# Patient Record
Sex: Female | Born: 1960 | Race: Black or African American | Hispanic: No | Marital: Single | Smoking: Never smoker
Health system: Southern US, Community
[De-identification: ages and names within clinical notes are randomized; demographics above are authoritative.]

## PROBLEM LIST (undated history)

## (undated) DIAGNOSIS — I10 Essential (primary) hypertension: Secondary | ICD-10-CM

## (undated) HISTORY — DX: Essential (primary) hypertension: I10

## (undated) HISTORY — PX: LUMBAR FUSION: SHX111

---

## 2010-07-23 DIAGNOSIS — K219 Gastro-esophageal reflux disease without esophagitis: Secondary | ICD-10-CM | POA: Insufficient documentation

## 2013-04-13 DIAGNOSIS — E11319 Type 2 diabetes mellitus with unspecified diabetic retinopathy without macular edema: Secondary | ICD-10-CM | POA: Insufficient documentation

## 2017-01-26 DIAGNOSIS — I152 Hypertension secondary to endocrine disorders: Secondary | ICD-10-CM | POA: Insufficient documentation

## 2017-01-29 DIAGNOSIS — E1165 Type 2 diabetes mellitus with hyperglycemia: Secondary | ICD-10-CM | POA: Insufficient documentation

## 2017-01-29 DIAGNOSIS — I129 Hypertensive chronic kidney disease with stage 1 through stage 4 chronic kidney disease, or unspecified chronic kidney disease: Secondary | ICD-10-CM | POA: Insufficient documentation

## 2017-01-29 DIAGNOSIS — Z794 Long term (current) use of insulin: Secondary | ICD-10-CM | POA: Insufficient documentation

## 2017-09-01 DIAGNOSIS — Z981 Arthrodesis status: Secondary | ICD-10-CM | POA: Insufficient documentation

## 2018-04-20 LAB — HM MAMMOGRAPHY

## 2018-06-01 DIAGNOSIS — G4700 Insomnia, unspecified: Secondary | ICD-10-CM | POA: Insufficient documentation

## 2019-02-01 LAB — HM COLONOSCOPY

## 2019-06-24 DIAGNOSIS — I340 Nonrheumatic mitral (valve) insufficiency: Secondary | ICD-10-CM | POA: Insufficient documentation

## 2019-07-12 DIAGNOSIS — F329 Major depressive disorder, single episode, unspecified: Secondary | ICD-10-CM | POA: Insufficient documentation

## 2019-07-12 DIAGNOSIS — F3341 Major depressive disorder, recurrent, in partial remission: Secondary | ICD-10-CM | POA: Insufficient documentation

## 2019-07-30 DIAGNOSIS — N1831 Chronic kidney disease, stage 3a: Secondary | ICD-10-CM | POA: Insufficient documentation

## 2019-08-27 DIAGNOSIS — M7062 Trochanteric bursitis, left hip: Secondary | ICD-10-CM | POA: Insufficient documentation

## 2019-08-27 DIAGNOSIS — M7061 Trochanteric bursitis, right hip: Secondary | ICD-10-CM | POA: Insufficient documentation

## 2020-05-25 DIAGNOSIS — B948 Sequelae of other specified infectious and parasitic diseases: Secondary | ICD-10-CM | POA: Insufficient documentation

## 2020-05-25 DIAGNOSIS — U099 Dyspnea, unspecified: Secondary | ICD-10-CM | POA: Insufficient documentation

## 2020-07-25 ENCOUNTER — Emergency Department (HOSPITAL_COMMUNITY): Payer: Medicaid Other

## 2020-07-25 ENCOUNTER — Other Ambulatory Visit: Payer: Self-pay

## 2020-07-25 ENCOUNTER — Emergency Department (HOSPITAL_COMMUNITY)
Admission: EM | Admit: 2020-07-25 | Discharge: 2020-07-25 | Disposition: A | Discharge status: Home / Self Care | Payer: Medicaid Other | Attending: Emergency Medicine | Admitting: Emergency Medicine

## 2020-07-25 DIAGNOSIS — M545 Low back pain, unspecified: Secondary | ICD-10-CM | POA: Insufficient documentation

## 2020-07-25 DIAGNOSIS — E1165 Type 2 diabetes mellitus with hyperglycemia: Secondary | ICD-10-CM | POA: Insufficient documentation

## 2020-07-25 DIAGNOSIS — R739 Hyperglycemia, unspecified: Secondary | ICD-10-CM

## 2020-07-25 DIAGNOSIS — R1031 Right lower quadrant pain: Secondary | ICD-10-CM | POA: Insufficient documentation

## 2020-07-25 DIAGNOSIS — R112 Nausea with vomiting, unspecified: Secondary | ICD-10-CM | POA: Diagnosis present

## 2020-07-25 DIAGNOSIS — Z794 Long term (current) use of insulin: Secondary | ICD-10-CM | POA: Diagnosis not present

## 2020-07-25 DIAGNOSIS — R8290 Unspecified abnormal findings in urine: Secondary | ICD-10-CM | POA: Insufficient documentation

## 2020-07-25 LAB — COMPREHENSIVE METABOLIC PANEL
ALT: 15 U/L (ref 0–44)
AST: 18 U/L (ref 15–41)
Albumin: 3.3 g/dL — ABNORMAL LOW (ref 3.5–5.0)
Alkaline Phosphatase: 54 U/L (ref 38–126)
Anion gap: 6 (ref 5–15)
BUN: 17 mg/dL (ref 6–20)
CO2: 28 mmol/L (ref 22–32)
Calcium: 9.1 mg/dL (ref 8.9–10.3)
Chloride: 101 mmol/L (ref 98–111)
Creatinine, Ser: 1.22 mg/dL — ABNORMAL HIGH (ref 0.44–1.00)
GFR, Estimated: 51 mL/min — ABNORMAL LOW (ref >60)
Glucose, Bld: 179 mg/dL — ABNORMAL HIGH (ref 70–99)
Potassium: 4.6 mmol/L (ref 3.5–5.1)
Sodium: 135 mmol/L (ref 135–145)
Total Bilirubin: 0.8 mg/dL (ref 0.3–1.2)
Total Protein: 7.5 g/dL (ref 6.5–8.1)

## 2020-07-25 LAB — URINALYSIS, ROUTINE W REFLEX MICROSCOPIC
Bilirubin Urine: NEGATIVE
Glucose, UA: NEGATIVE mg/dL
Hgb urine dipstick: NEGATIVE
Ketones, ur: NEGATIVE mg/dL
Nitrite: NEGATIVE
Protein, ur: 100 mg/dL — AB
Specific Gravity, Urine: 1.023 (ref 1.005–1.030)
pH: 6 (ref 5.0–8.0)

## 2020-07-25 LAB — CBC WITH DIFFERENTIAL/PLATELET
Abs Immature Granulocytes: 0.02 10*3/uL (ref 0.00–0.07)
Basophils Absolute: 0 10*3/uL (ref 0.0–0.1)
Basophils Relative: 1 %
Eosinophils Absolute: 0.3 10*3/uL (ref 0.0–0.5)
Eosinophils Relative: 3 %
HCT: 36.8 % (ref 36.0–46.0)
Hemoglobin: 12 g/dL (ref 12.0–15.0)
Immature Granulocytes: 0 %
Lymphocytes Relative: 19 %
Lymphs Abs: 1.7 10*3/uL (ref 0.7–4.0)
MCH: 29.7 pg (ref 26.0–34.0)
MCHC: 32.6 g/dL (ref 30.0–36.0)
MCV: 91.1 fL (ref 80.0–100.0)
Monocytes Absolute: 0.5 10*3/uL (ref 0.1–1.0)
Monocytes Relative: 6 %
Neutro Abs: 6.3 10*3/uL (ref 1.7–7.7)
Neutrophils Relative %: 71 %
Platelets: 439 10*3/uL — ABNORMAL HIGH (ref 150–400)
RBC: 4.04 MIL/uL (ref 3.87–5.11)
RDW: 14.6 % (ref 11.5–15.5)
WBC: 8.8 10*3/uL (ref 4.0–10.5)
nRBC: 0 % (ref 0.0–0.2)

## 2020-07-25 LAB — LIPASE, BLOOD: Lipase: 43 U/L (ref 11–51)

## 2020-07-25 LAB — CBG MONITORING, ED: Glucose-Capillary: 180 mg/dL — ABNORMAL HIGH (ref 70–99)

## 2020-07-25 MED ORDER — LACTATED RINGERS IV BOLUS
1000.0000 mL | Freq: Once | INTRAVENOUS | Status: AC
  Administered 2020-07-25: 1000 mL via INTRAVENOUS

## 2020-07-25 MED ORDER — IOHEXOL 300 MG/ML  SOLN
100.0000 mL | Freq: Once | INTRAMUSCULAR | Status: AC | PRN
  Administered 2020-07-25: 100 mL via INTRAVENOUS

## 2020-07-25 MED ORDER — CEPHALEXIN 500 MG PO CAPS: 500.0000 mg | ORAL_CAPSULE | Freq: Four times a day (QID) | ORAL | 0 refills | Status: DC

## 2020-07-25 MED ORDER — INSULIN ASPART 100 UNIT/ML ~~LOC~~ SOLN
10.0000 [IU] | Freq: Three times a day (TID) | SUBCUTANEOUS | 11 refills | Status: DC
  Filled 2020-07-25: qty 10, 34d supply, fill #0

## 2020-07-25 MED ORDER — ONDANSETRON 4 MG PO TBDP: 4.0000 mg | ORAL_TABLET | Freq: Three times a day (TID) | ORAL | 0 refills | Status: DC | PRN

## 2020-07-25 MED ORDER — ONDANSETRON 4 MG PO TBDP
4.0000 mg | ORAL_TABLET | Freq: Once | ORAL | Status: AC
  Administered 2020-07-25: 4 mg via ORAL
  Filled 2020-07-25: qty 1

## 2020-07-25 MED ORDER — TOUJEO MAX SOLOSTAR 300 UNIT/ML ~~LOC~~ SOPN: 66.0000 [IU] | PEN_INJECTOR | Freq: Every evening | SUBCUTANEOUS | 0 refills | Status: DC

## 2020-07-25 MED ORDER — MORPHINE SULFATE (PF) 4 MG/ML IV SOLN
4.0000 mg | Freq: Once | INTRAVENOUS | Status: AC
Start: 2020-07-25 — End: 2020-07-25
  Administered 2020-07-25: 4 mg via INTRAVENOUS
  Filled 2020-07-25: qty 1

## 2020-07-25 MED ORDER — INSULIN ASPART 100 UNIT/ML ~~LOC~~ SOLN: 10.0000 [IU] | Freq: Three times a day (TID) | SUBCUTANEOUS | 11 refills | Status: DC

## 2020-07-25 MED ORDER — TOUJEO MAX SOLOSTAR 300 UNIT/ML ~~LOC~~ SOPN
66.0000 [IU] | PEN_INJECTOR | Freq: Every evening | SUBCUTANEOUS | 0 refills | Status: DC
  Filled 2020-07-25: qty 3, 13d supply, fill #0

## 2020-07-25 MED ORDER — ONDANSETRON 4 MG PO TBDP
4.0000 mg | ORAL_TABLET | Freq: Three times a day (TID) | ORAL | 0 refills | Status: DC | PRN
  Filled 2020-07-25: qty 20, 7d supply, fill #0

## 2020-07-25 MED ORDER — CEPHALEXIN 500 MG PO CAPS
500.0000 mg | ORAL_CAPSULE | Freq: Four times a day (QID) | ORAL | 0 refills | Status: DC
  Filled 2020-07-25: qty 20, 5d supply, fill #0

## 2020-07-25 NOTE — Care Management (Signed)
  [facility] Medication Assistance Card Name: Mahitha Hickling ID (MRN): [id number] Bin: [id number] RX Group: [id number] Discharge Date: 07/25/2020 Expiration Date: 08/04/2020                                           (must be filled within 7 days of discharge)     Dear   : Shon Millet  You have been approved to have the prescriptions written by your discharging physician filled through our [facility] (Medication Assistance Through [facility]) program. This program allows for a one-time (no refills) 34-day supply of selected medications for a low copay amount.  The copay is $3.00 per prescription. For instance, if you have one prescription, you will pay $3.00; for two prescriptions, you pay $6.00; for three prescriptions, you pay $9.00; and so on.  Only certain pharmacies are participating in this program with [facility]. You will need to select one of the pharmacies from the attached list and take your prescriptions, this letter, and your photo ID to one of the participating pharmacies.   We are excited that you are able to use the [facility] program to get your medications. These prescriptions must be filled within 7 days of hospital discharge or they will no longer be valid for the [facility] program. Should you have any problems with your prescriptions please contact your case management team member at [phone] for [place]/ [facility].  Thank you, [facility]

## 2020-07-25 NOTE — Care Management (Signed)
[facility] will [facility] from [facility] and be delivered to patient prior to discharge from the ED.

## 2020-07-25 NOTE — ED Triage Notes (Signed)
Emergency Medicine Provider Triage Evaluation Note  Ilee Randleman , a 60 y.o. female  was evaluated in triage.  Pt complains of nausea and vomiting x 1 day. 2 episodes of non-bloody, non-bilious emesis this AM associated with decreased po intake. She ran out of her insulin and glucose test strips 1 week ago. Admits to feeling warm, but no documented fever.   Review of Systems  Positive: Nausea, vomiting, abdominal pain Negative: diarrhea  Physical Exam  There were no vitals taken for this visit. Gen:   Awake, no distress  HEENT:  Atraumatic  Resp:  Normal effort  Cardiac:  Normal rate  Abd:   Nondistended, nontender  MSK:   Moves extremities without difficulty  Neuro:  Speech clear   Medical Decision Making  Medically screening exam initiated at 1:45 PM.  Appropriate orders placed.  Jermeka Schlotterbeck was informed that the remainder of the evaluation will be completed by another provider, this initial triage assessment does not replace that evaluation, and the importance of remaining in the ED until their evaluation is complete.  Clinical Impression  Nausea and vomiting x1 day after running our of insulin. Concern for hyperglycemia. CBG 180 at triage. Labs ordered. Zofran given.    Mannie Stabile, [place] 07/25/20 1356

## 2020-07-25 NOTE — ED Notes (Signed)
Patient verbalizes understanding of discharge instructions. Prescriptions and follow-up care reviewed. Opportunity for questioning and answers were provided. Armband removed by staff, pt discharged from ED via wheelchair was ambulatory to wheelchair and car.

## 2020-07-25 NOTE — Discharge Instructions (Addendum)
You received in the emergency department for nausea, vomiting and abdominal pain  Urinalysis had some bacteria in it.  We will treat you for UTI and kidney infection.  Your urine was sent to the lab to confirm or exclude an infection.  You will be contacted if your antibiotics need to be discontinued or changed.  CT showed a small lesion in your right kidney, this is very small and unlikely to be causing your symptoms.  You may discuss this with your primary care doctor.  Stay hydrated.  Use ondansetron every 8 hours for nausea.  I have refilled your insulin and diabetes medicines.  Take Keflex 4 times a day for a possible urinary tract infection  Return to the ED for worsening or new symptoms

## 2020-07-25 NOTE — ED Provider Notes (Signed)
[facility] Provider Note   CSN: [id number] Arrival date & time: 07/25/20  1338     History Chief Complaint  Patient presents with  . Emesis    Gabriela NajjarLashea Struve is a 60 y.o. female with past medical history of insulin-dependent diabetes, chronic back pain s/p lumbar spinal fusion presents to the ED for evaluation of sudden onset nausea associated with vomiting and abdominal pain.  She woke up with the symptoms this morning.  She vomited twice, reports large volume of emesis.  No blood.  Nonbilious emesis.  The last time she vomited was 2 hours ago.  She had diffuse lower abdominal pain while she was vomiting in all morning but reports this has improved and is now mild to moderate.  She felt hot and broke out in a sweat when she was vomiting.  No actual recorded fever however.  Reports back pain "like normal".  Reports chronic constipation, takes MiraLAX daily.  She has not taken in the last 2 days and think she is constipated.  Caramelized time she had a bowel movement maybe 2 days ago.  Is passing gas.  No abdominal surgeries.  No history of kidney stones.  Denies dysuria, frequency or hematuria.  No abnormal vaginal discharge changes, vaginal bleeding.  She is not sexually active.  She is not concerned about a pelvic cause of her symptoms.  Just remembered that she had food from the food bank yesterday.  She had chicken that did not taste quite right.  Also had a salad.  No sick contacts.  Also reports that she is currently without medical insurance.  She was on disability after having severe Covid April 17, 2019.  Eventually lost her job and her medical insurance.  Also has issues with her chronic back pain.  She ran out of her Humalog and Ozempic a week or so ago.  She still has Toujeo but is also running out.  She is awaiting Medicaid.  She is requesting refills.  She is worried that her symptoms are due to high sugar levels.  Her diabetes regimen includes  Humalog 10 units with meals, Ozempic 0.5 mg, Toujeo 66 units in the evening.  No chest pain, shortness of breath, cough.  She has received 2 Covid vaccinations after having Covid in 2020.  She received ODT Zofran in triage and reports feeling much improvement in her nausea.  HPI     No past medical history on file.  There are no problems to display for this patient.   ** The histories are not reviewed yet. Please review them in the "History" navigator section and refresh this SmartLink.   OB History   No obstetric history on file.     No family history on file.     Home Medications Prior to Admission medications   Medication Sig Start Date End Date Taking? Authorizing Provider  cephALEXin (KEFLEX) 500 MG capsule Take 1 capsule (500 mg total) by mouth 4 (four) times daily. 07/25/20  Yes Sharen HeckGibbons, Jewelz Kobus J, PA-C  insulin aspart (NOVOLOG) 100 UNIT/ML injection Inject 10 Units into the skin 3 (three) times daily before meals. 07/25/20  Yes Sharen HeckGibbons, Amariah Kierstead J, PA-C  insulin glargine, 2 Unit Dial, (TOUJEO MAX SOLOSTAR) 300 UNIT/ML Solostar Pen Inject 66 Units into the skin Nightly. 07/25/20  Yes Sharen HeckGibbons, Farrah Skoda J, PA-C  ondansetron (ZOFRAN ODT) 4 MG disintegrating tablet Take 1 tablet (4 mg total) by mouth every 8 (eight) hours as needed for nausea or vomiting. 07/25/20  Yes Liberty Handy, PA-C    Allergies    Patient has no known allergies.  Review of Systems   Review of Systems  Gastrointestinal: Positive for abdominal pain, nausea and vomiting.  All other systems reviewed and are negative.   Physical Exam Updated Vital Signs BP (!) 170/82   Pulse 69   Temp 98.2 F (36.8 C) (Oral)   Resp 14   SpO2 98%   Physical Exam Vitals and nursing note reviewed.  Constitutional:      Appearance: She is well-developed.     Comments: Non toxic in NAD  HENT:     Head: Normocephalic and atraumatic.     Nose: Nose normal.  Eyes:     Conjunctiva/sclera: Conjunctivae normal.   Cardiovascular:     Rate and Rhythm: Normal rate and regular rhythm.  Pulmonary:     Effort: Pulmonary effort is normal.     Breath sounds: Normal breath sounds.  Abdominal:     General: Bowel sounds are normal.     Palpations: Abdomen is soft.     Tenderness: There is abdominal tenderness (right mid/lateral and lower quadrants ). There is guarding (right mid/lateral and lower quadrants). Positive signs include McBurney's sign.     Comments: No rigidity, rebound. Negative Murphy's. Active BS to lower quadrants. Obese abdomen, no distention.   Musculoskeletal:        General: Normal range of motion.     Cervical back: Normal range of motion.     Lumbar back: Tenderness present.       Back:     Comments: Right lumbar paraspinal muscle tenderness vs very low right CVAT. Surgical scar in lumbar spine noted   Skin:    General: Skin is warm and dry.     Capillary Refill: Capillary refill takes less than 2 seconds.  Neurological:     Mental Status: She is alert.  Psychiatric:        Behavior: Behavior normal.     ED Results / Procedures / Treatments   Labs (all labs ordered are listed, but only abnormal results are displayed) Labs Reviewed  CBC WITH DIFFERENTIAL/PLATELET - Abnormal; Notable for the following components:      Result Value   Platelets 439 (*)    All other components within normal limits  COMPREHENSIVE METABOLIC PANEL - Abnormal; Notable for the following components:   Glucose, Bld 179 (*)    Creatinine, Ser 1.22 (*)    Albumin 3.3 (*)    GFR, Estimated 51 (*)    All other components within normal limits  URINALYSIS, ROUTINE W REFLEX MICROSCOPIC - Abnormal; Notable for the following components:   APPearance HAZY (*)    Protein, ur 100 (*)    Leukocytes,Ua SMALL (*)    Bacteria, UA FEW (*)    Non Squamous Epithelial 0-5 (*)    All other components within normal limits  CBG MONITORING, ED - Abnormal; Notable for the following components:   Glucose-Capillary 180  (*)    All other components within normal limits  URINE CULTURE  LIPASE, BLOOD  CBG MONITORING, ED    EKG None  Radiology CT ABDOMEN PELVIS W CONTRAST  Result Date: 07/25/2020 CLINICAL DATA:  Right lower quadrant pain CVA tenderness EXAM: CT ABDOMEN AND PELVIS WITH CONTRAST TECHNIQUE: Multidetector CT imaging of the abdomen and pelvis was performed using the standard protocol following bolus administration of intravenous contrast. CONTRAST:  OMNIPAQUE IOHEXOL 300 MG/ML  SOLN COMPARISON:  None. FINDINGS: Lower  chest: The visualized heart size within normal limits. No pericardial fluid/thickening. A small hiatal hernia is present. The visualized portions of the lungs are clear. Hepatobiliary: There is diffuse low density seen throughout the liver parenchyma.The main portal vein is patent. No evidence of calcified gallstones, gallbladder wall thickening or biliary dilatation. Pancreas: Unremarkable. No pancreatic ductal dilatation or surrounding inflammatory changes. Spleen: Normal in size without focal abnormality. Adrenals/Urinary Tract: Both adrenal glands appear normal. The kidneys and collecting system appear normal without evidence of urinary tract calculus or hydronephrosis. There is a tiny 1 cm low-density lesion seen in the upper pole of the left kidney. Bladder is unremarkable. Stomach/Bowel: The stomach, small bowel, are normal in appearance. A moderate amount of colonic stool is present throughout. There appears to be question of mild wall thickening seen around the rectum with mild presacral edema. Vascular/Lymphatic: There are no enlarged mesenteric, retroperitoneal, or pelvic lymph nodes. Scattered aortic atherosclerotic calcifications are seen without aneurysmal dilatation. Reproductive: The uterus and adnexa are unremarkable. Other: No evidence of abdominal wall mass or hernia. Musculoskeletal: No acute or significant osseous findings. Final fixation hardware seen at L4-L5.  IMPRESSION: Moderate amount of colonic stool with findings that could be suggestive of mild proctocolitis Aortic Atherosclerosis (ICD10-I70.0). Small hiatal hernia Electronically Signed   By: Jonna Clark M.D.   On: 07/25/2020 18:25    Procedures Procedures   Medications Ordered in ED Medications  ondansetron (ZOFRAN-ODT) disintegrating tablet 4 mg (4 mg Oral Given 07/25/20 1359)  lactated ringers bolus 1,000 mL (0 mLs Intravenous Stopped 07/25/20 1647)  morphine 4 MG/ML injection 4 mg (4 mg Intravenous Given 07/25/20 1547)  iohexol (OMNIPAQUE) 300 MG/ML solution 100 mL (100 mLs Intravenous Contrast Given 07/25/20 1800)    ED Course  I have reviewed the triage vital signs and the nursing notes.  Pertinent labs & imaging results that were available during my care of the patient were reviewed by me and considered in my medical decision making (see chart for details).  Clinical Course as of 07/25/20 1929  Tue Jul 25, 2020  1538 Glori LuisMarland Kitchen): SMALL [CG]  1636 WBC, UA: 11-20 [CG]  1636 Bacteria, UA(!): FEW [CG]  1652 Patient reevaluated.  Reports improvement in pain.  On repeat abdominal exam has persistent but improved to right lower and right mid/lateral abdominal tenderness with guarding.  Again very low right CVA versus lumbar muscular tenderness.  Will obtain CT scan [CG]  1900 CT ABDOMEN PELVIS W CONTRAST IMPRESSION: Moderate amount of colonic stool with findings that could be suggestive of mild proctocolitis  Aortic Atherosclerosis (ICD10-I70.0).  Small hiatal hernia  [CG]  1900 CT ABDOMEN PELVIS W CONTRAST  Adrenals/Urinary Tract: Both adrenal glands appear normal. The kidneys and collecting system appear normal without evidence of urinary tract calculus or hydronephrosis. There is a tiny 1 cm low-density lesion seen in the upper pole of the left kidney.  Bladder is unremarkable. [CG]    Clinical Course User Index [CG] Liberty Handy, PA-C   MDM Rules/Calculators/A&P                           60 year old female presents to the ED for sudden onset nausea, vomiting and right-sided mid/lower abdominal pain since this morning.  Concerned about her glucose levels.  Has been without her diabetes medicines for a week.  No fever.  Chronic constipation, last BM 2 days ago and passing gas.  Denies UTI symptoms.  No other abdominal surgeries.  EMR, triage nurses notes reviewed  Lab work including urinalysis ordered in triage.  DDx: Viral gastroenteritis given that she recently had suspicious food before symptoms began.  Other considerations include appendicitis, right-sided diverticulitis.  Less likely SBO, cholecystitis, pancreatitis.  She has no pelvic/vaginal symptoms and pelvic etiology unlikely.  Has right lower and right CVA tenderness but no UTI symptoms.  Medicines ordered-morphine, IV fluids.  Patient reevaluated after medicines.  Reports improvement in pain however on repeat abdominal exam reported continued right mid/right lower quadrant abdominal tenderness.  We will proceed with CT A/P.  Labs and imaging personally visualized and interpreted  Labs reveal-glucose 179 with normal anion gap.  Creatinine 1.22.  Normal WBC.  Urinalysis with few bacteria, 11-20 WBC, small leukocytes.  Again, she denies UTI symptoms.  However given nausea, vomiting, right-sided abdominal and CVA tenderness will send for culture and treat for UTI/pyelonephritis.  CT reveals findings of possible proctocolitis and small lesion in the right kidney.  Patient has no clinical symptoms of proctocolitis like diarrhea, discharge.  This diagnosis does not fit clinical presentation.  Overall patient symptoms have improved.  She is tolerating p.o.  Pain has improved.  Will discharge with Keflex, Zofran.  Her diabetes medicines have been refilled.  Social work Burna Mortimer has assisted patient and will pick up medicines for her.  Return precautions discussed with patient.  Patient is in agreement with plan  of care.  Discussed with EDP.  Final Clinical Impression(s) / ED Diagnoses Final diagnoses:  Hyperglycemia  Abnormal urinalysis    Rx / DC Orders ED Discharge Orders         Ordered    insulin aspart (NOVOLOG) 100 UNIT/ML injection  3 times daily before meals        07/25/20 1753    insulin glargine, 2 Unit Dial, (TOUJEO MAX SOLOSTAR) 300 UNIT/ML Solostar Pen  (Dosepack) Nightly - one time        07/25/20 1753    cephALEXin (KEFLEX) 500 MG capsule  4 times daily        07/25/20 1925    ondansetron (ZOFRAN ODT) 4 MG disintegrating tablet  Every 8 hours PRN        07/25/20 1925           Jerrell Mylar 07/25/20 1929    Lorre Nick, MD 07/27/20 1122

## 2020-07-25 NOTE — ED Triage Notes (Signed)
Pt arrives POV with reports of being out of her insulin X1 week. Pt states she recently switched insurances. Pt endorses nausea and vomiting today.

## 2020-07-26 ENCOUNTER — Other Ambulatory Visit (HOSPITAL_COMMUNITY): Payer: Self-pay

## 2020-07-26 LAB — URINE CULTURE

## 2020-08-10 ENCOUNTER — Encounter: Payer: Self-pay | Admitting: Family Medicine

## 2020-08-10 ENCOUNTER — Other Ambulatory Visit: Payer: Self-pay

## 2020-08-10 ENCOUNTER — Ambulatory Visit (INDEPENDENT_AMBULATORY_CARE_PROVIDER_SITE_OTHER): Payer: Self-pay | Admitting: Family Medicine

## 2020-08-10 ENCOUNTER — Encounter: Payer: Self-pay | Admitting: *Deleted

## 2020-08-10 DIAGNOSIS — Z794 Long term (current) use of insulin: Secondary | ICD-10-CM

## 2020-08-10 DIAGNOSIS — M961 Postlaminectomy syndrome, not elsewhere classified: Secondary | ICD-10-CM

## 2020-08-10 DIAGNOSIS — I152 Hypertension secondary to endocrine disorders: Secondary | ICD-10-CM

## 2020-08-10 DIAGNOSIS — E1159 Type 2 diabetes mellitus with other circulatory complications: Secondary | ICD-10-CM

## 2020-08-10 DIAGNOSIS — E1165 Type 2 diabetes mellitus with hyperglycemia: Secondary | ICD-10-CM

## 2020-08-10 NOTE — Progress Notes (Signed)
Gabriela Guerrero - 60 y.o. female MRN [id number]  Date of birth: 1960-06-23  Subjective Chief Complaint  Patient presents with  . Establish Care    HPI Gabriela Guerrero is a 60 y.o. female here today for initial visit.  She has a history of HTN, T2DM, CKD, chronic low back pain, and depression with anxiety.   She had lumbar fusion however has had continued pain.  She has been seeing pain management/neurosurgery.  She is currently treated with tramadol and robaxin as needed.  She has been unable to work due to her condition however she was not deemed fully disabled.  She did have functional capacity evaluation which evidently said that she could do light/sedentary work.  Dr. Laurian Brim had been completing her form so that she could continue to receive income from her previous job, however he had surgery recently and has been out.  His nurse practitioner did complete forms however they would not accept because they were not completed by a physician.  She requests that I complete these temporarily until Dr. Laurian Brim returns.    History of poorly controlled diabetes.  She was seeing endocrinology previously however has not seen since losing insurance.  She has been using toujeo at 66 units nightly and novolog to units tid.  She is also taking ozempic.  She has done well with this and denies side effects.  She has a little of this left but needs refills but is unable to afford without insurance.  She has not looked into patient assistance programs.   HTN is managed with losartan, lconidine, carvedilol and amlodipine.  She reports taking all of these except clonidine.  She thinks that she does have this at home.  She denies side effects related to medication.  No BP monitoring at home.    ROS:  A comprehensive ROS was completed and negative except as noted per HPI  No Known Allergies  History reviewed. No pertinent past medical history.  Past Surgical History:  Procedure Laterality Date  . LUMBAR FUSION       Social History   Socioeconomic History  . Marital status: Single    Spouse name: Not on file  . Number of children: Not on file  . Years of education: Not on file  . Highest education level: Not on file  Occupational History  . Occupation: Disabled  Tobacco Use  . Smoking status: Never Smoker  . Smokeless tobacco: Never Used  Vaping Use  . Vaping Use: Never used  Substance and Sexual Activity  . Alcohol use: Never  . Drug use: Never  . Sexual activity: Not Currently    Partners: Male    Birth control/protection: Abstinence  Other Topics Concern  . Not on file  Social History Narrative  . Not on file   Social Determinants of Health   Financial Resource Strain: Not on file  Food Insecurity: Not on file  Transportation Needs: Not on file  Physical Activity: Not on file  Stress: Not on file  Social Connections: Not on file    Family History  Problem Relation Age of Onset  . Hypertension Mother   . Breast cancer Mother   . Hypertension Sister   . Stroke Sister     Health Maintenance  Topic Date Due  . HEMOGLOBIN A1C  Never done  . Hepatitis C Screening  Never done  . FOOT EXAM  Never done  . OPHTHALMOLOGY EXAM  Never done  . HIV Screening  Never done  . PAP  SMEAR-Modifier  Never done  . COLONOSCOPY (Pts 45-85yrs Insurance coverage will need to be confirmed)  Never done  . COVID-19 Vaccine (3 - Booster for Pfizer series) 12/17/2019  . MAMMOGRAM  04/20/2020  . INFLUENZA VACCINE  11/20/2020  . TETANUS/TDAP  06/26/2025  . PNEUMOCOCCAL POLYSACCHARIDE VACCINE AGE 64-64 HIGH RISK  Completed  . HPV VACCINES  Aged Out     ----------------------------------------------------------------------------------------------------------------------------------------------------------------------------------------------------------------- Physical Exam BP (!) 168/87 (BP Location: Left Arm, Patient Position: Sitting, Cuff Size: Large)   Pulse 71   Ht 5\' 2"  (1.575 m)   Wt  267 lb 8 oz (121.3 kg)   SpO2 98%   BMI 48.93 kg/m   Physical Exam Constitutional:      Appearance: Normal appearance. She is obese.  HENT:     Head: Normocephalic and atraumatic.  Eyes:     General: No scleral icterus. Cardiovascular:     Rate and Rhythm: Normal rate and regular rhythm.  Pulmonary:     Effort: Pulmonary effort is normal.     Breath sounds: Normal breath sounds.  Musculoskeletal:     Cervical back: Neck supple.     Comments: Antalgic gait.  Uses cane for ambulation.    Neurological:     Mental Status: She is alert.  Psychiatric:        Mood and Affect: Mood normal.        Behavior: Behavior normal.     ------------------------------------------------------------------------------------------------------------------------------------------------------------------------------------------------------------------- Assessment and Plan  Hypertension associated with diabetes (HCC) BP elevated today. Recommend continuation of current medications and add clonidine back on .  Recommend low sodium diet.  Uncontrolled type 2 diabetes mellitus with hyperglycemia, with long-term current use of insulin (HCC) She'll continue current insulin dosing with toujeo and novolog.  Continue 0.5mg  of ozempic weekly.  Discussed dietary changes.  Exercise limited due to chronic back pain.  Given forms to apply for patient assistance program for medications.   Failed back syndrome of lumbar spine She has been seeing Pain management/Neurosurgery for this.  They are managed with tramadol and robaxin.  Previous notes reviewed.  I did complete her forms until Dr. returns.   >45 minutes spent including pre visit preparation, review of prior notes and labs, encounter with patient during visit and same day documentation.  No orders of the defined types were placed in this encounter.   Return in about 4 weeks (around 09/07/2020) for DM/HTN.    This visit occurred during the  SARS-CoV-2 public health emergency.  Safety protocols were in place, including screening questions prior to the visit, additional usage of staff PPE, and extensive cleaning of exam room while observing appropriate contact time as indicated for disinfecting solutions.

## 2020-08-11 ENCOUNTER — Telehealth: Payer: Self-pay | Admitting: Family Medicine

## 2020-08-11 ENCOUNTER — Telehealth: Payer: Self-pay

## 2020-08-11 NOTE — Telephone Encounter (Signed)
Patient dropped off forms for PCP. She messed up a couple places (wrote a wrong number down... marked it out and put correct number on there, etc.) didn't know if she needed to re-do the paper. Put sticky notes in places she had questions about as well. Left paperwork in provider box. AM

## 2020-08-11 NOTE — Telephone Encounter (Signed)
Scanned into patients chart, contacted patient for pick up, she wanted me to fax, so faxed forms & left her copy in the accordion up front to pick up. AM

## 2020-08-11 NOTE — Telephone Encounter (Signed)
Dr. Ashley Royalty has completed the 703 paperwork.   Given to [employer]. Please contact patient concerning 703 only (for pick-up). Her other documents left today have not been finished yet.   Copy made for scanning.

## 2020-08-15 DIAGNOSIS — M961 Postlaminectomy syndrome, not elsewhere classified: Secondary | ICD-10-CM | POA: Insufficient documentation

## 2020-08-15 NOTE — Assessment & Plan Note (Signed)
BP elevated today. Recommend continuation of current medications and add clonidine back on .  Recommend low sodium diet.

## 2020-08-15 NOTE — Assessment & Plan Note (Signed)
She'll continue current insulin dosing with toujeo and novolog.  Continue 0.5mg  of ozempic weekly.  Discussed dietary changes.  Exercise limited due to chronic back pain.  Given forms to apply for patient assistance program for medications.

## 2020-08-15 NOTE — Assessment & Plan Note (Signed)
She has been seeing Pain management/Neurosurgery for this.  They are managed with tramadol and robaxin.  Previous notes reviewed.  I did complete her forms until Dr. Laurian Brim returns.

## 2020-08-18 ENCOUNTER — Other Ambulatory Visit: Payer: Self-pay | Admitting: Family Medicine

## 2020-08-18 ENCOUNTER — Encounter: Payer: Self-pay | Admitting: Family Medicine

## 2020-08-18 NOTE — Telephone Encounter (Signed)
Scanned into patients chart & contacted patient for pick up.    Also patient wanted me to pass along that she received a letter that she has jury duty but needs a doctor's note to be excused from going. AM

## 2020-08-18 NOTE — Telephone Encounter (Signed)
Completed documents given to [employer].   Contact patient for payment and pick-up.   Scan to chart.   Thanks

## 2020-08-18 NOTE — Telephone Encounter (Signed)
Forms completed and placed in Panya's box

## 2020-08-21 ENCOUNTER — Telehealth: Payer: Self-pay

## 2020-08-21 NOTE — Telephone Encounter (Signed)
Pt dropped off paperwork for [employer] duty. She stated that someone would give her a letter that states she can't sit through [employer] duty. Pt would like to be called when paperwork is complete. Paperwork in message box- tvt

## 2020-08-25 ENCOUNTER — Encounter: Payer: Self-pay | Admitting: Family Medicine

## 2020-08-25 NOTE — Telephone Encounter (Signed)
Letter completed and routed to Panya to print.

## 2020-08-25 NOTE — Telephone Encounter (Signed)
Document printed and note sent to [facility] pool to call for patient pick-up

## 2020-08-25 NOTE — Telephone Encounter (Signed)
Called pt and lvm that paperwork is at the front desk-tvt

## 2020-09-07 ENCOUNTER — Ambulatory Visit: Payer: Self-pay | Admitting: Family Medicine

## 2020-10-06 ENCOUNTER — Other Ambulatory Visit: Payer: Self-pay

## 2020-10-06 MED ORDER — ONDANSETRON 4 MG PO TBDP
4.0000 mg | ORAL_TABLET | Freq: Three times a day (TID) | ORAL | 0 refills | Status: DC | PRN
Start: 2020-10-06 — End: 2021-10-03

## 2020-10-06 MED ORDER — ATORVASTATIN CALCIUM 10 MG PO TABS: 1.0000 | ORAL_TABLET | Freq: Every day | ORAL | 2 refills | Status: DC

## 2020-10-06 MED ORDER — ALBUTEROL SULFATE HFA 108 (90 BASE) MCG/ACT IN AERS: INHALATION_SPRAY | RESPIRATORY_TRACT | 2 refills | Status: DC

## 2020-10-06 MED ORDER — OZEMPIC (0.25 OR 0.5 MG/DOSE) 2 MG/1.5ML ~~LOC~~ SOPN: 0.5000 mg | PEN_INJECTOR | SUBCUTANEOUS | 2 refills | Status: DC

## 2020-10-06 MED ORDER — CARVEDILOL 25 MG PO TABS: 25.0000 mg | ORAL_TABLET | Freq: Two times a day (BID) | ORAL | 2 refills | Status: DC

## 2020-10-06 MED ORDER — POTASSIUM CHLORIDE ER 10 MEQ PO TBCR: 10.0000 meq | EXTENDED_RELEASE_TABLET | Freq: Every day | ORAL | 2 refills | Status: DC

## 2020-10-31 ENCOUNTER — Encounter: Payer: Self-pay | Admitting: Family Medicine

## 2020-10-31 ENCOUNTER — Other Ambulatory Visit: Payer: Self-pay

## 2020-10-31 ENCOUNTER — Ambulatory Visit (INDEPENDENT_AMBULATORY_CARE_PROVIDER_SITE_OTHER): Payer: Medicaid Other | Admitting: Family Medicine

## 2020-10-31 VITALS — BP 159/78 | HR 70 | Temp 98.1°F | Resp 18

## 2020-10-31 DIAGNOSIS — I152 Hypertension secondary to endocrine disorders: Secondary | ICD-10-CM | POA: Diagnosis not present

## 2020-10-31 DIAGNOSIS — E1159 Type 2 diabetes mellitus with other circulatory complications: Secondary | ICD-10-CM | POA: Diagnosis not present

## 2020-10-31 DIAGNOSIS — B372 Candidiasis of skin and nail: Secondary | ICD-10-CM

## 2020-10-31 LAB — WET PREP FOR TRICH, YEAST, CLUE
MICRO NUMBER:: 12108871
Specimen Quality: ADEQUATE

## 2020-10-31 MED ORDER — CLONIDINE HCL 0.1 MG PO TABS: 0.1000 mg | ORAL_TABLET | Freq: Two times a day (BID) | ORAL | 1 refills | Status: DC

## 2020-10-31 MED ORDER — FUROSEMIDE 20 MG PO TABS: 20.0000 mg | ORAL_TABLET | Freq: Every day | ORAL | 1 refills | Status: DC

## 2020-10-31 MED ORDER — FUROSEMIDE 20 MG PO TABS
20.0000 mg | ORAL_TABLET | Freq: Every day | ORAL | 1 refills | Status: DC
Start: 2020-10-31 — End: 2020-10-31

## 2020-10-31 MED ORDER — FLUCONAZOLE 150 MG PO TABS: 150.0000 mg | ORAL_TABLET | Freq: Once | ORAL | 1 refills | Status: AC

## 2020-10-31 MED ORDER — AMLODIPINE BESYLATE 10 MG PO TABS: 10.0000 mg | ORAL_TABLET | Freq: Every day | ORAL | 1 refills | Status: DC

## 2020-10-31 MED ORDER — NYSTATIN 100000 UNIT/GM EX POWD: 1.0000 "application " | Freq: Three times a day (TID) | CUTANEOUS | 0 refills | Status: DC

## 2020-10-31 MED ORDER — FLUCONAZOLE 150 MG PO TABS: 150.0000 mg | ORAL_TABLET | Freq: Once | ORAL | 1 refills | Status: DC

## 2020-10-31 NOTE — Progress Notes (Signed)
Acute Office Visit  Subjective:    Patient ID: Gabriela Guerrero, female    DOB: July 14, 1960, 60 y.o.   MRN: [id number]  Chief Complaint  Patient presents with   Rash    HPI Patient is in today for rash to groin.  Patient states that approximately a week ago she started to notice a rash on her inner thigh, groin, lower abdominal folds.  States it is itchy and red.  Reports she is not sexually active.  She is unsure if she has had any changes to her soaps or detergents.  Cannot recall if she has been on any antibiotics recently.  Blood sugars at home have been running high, greater than 150s.  She denies any internal vaginal symptoms including itching, pain, discharge.  No dysuria, fevers, odor.  She has tried some vaginocele cream on the areas but reports it is not helping.  Denies any open wounds, fevers, other rashes.   History reviewed. No pertinent past medical history.  Past Surgical History:  Procedure Laterality Date   LUMBAR FUSION      Family History  Problem Relation Age of Onset   Hypertension Mother    Breast cancer Mother    Hypertension Sister    Stroke Sister     Social History   Socioeconomic History   Marital status: Single    Spouse name: Not on file   Number of children: Not on file   Years of education: Not on file   Highest education level: Not on file  Occupational History   Occupation: Disabled  Tobacco Use   Smoking status: Never   Smokeless tobacco: Never  Vaping Use   Vaping Use: Never used  Substance and Sexual Activity   Alcohol use: Never   Drug use: Never   Sexual activity: Not Currently    Partners: Male    Birth control/protection: Abstinence  Other Topics Concern   Not on file  Social History Narrative   Not on file   Social Determinants of Health   Financial Resource Strain: Not on file  Food Insecurity: Not on file  Transportation Needs: Not on file  Physical Activity: Not on file  Stress: Not on file  Social Connections:  Not on file  Intimate Partner Violence: Not on file    Outpatient Medications Prior to Visit  Medication Sig Dispense Refill   acetaminophen (TYLENOL) 650 MG CR tablet Take by mouth.     albuterol (VENTOLIN HFA) 108 (90 Base) MCG/ACT inhaler INHALE 2 PUFFS BY MOUTH EVERY 4 TO 6 HOURS AS NEEDED FOR WHEEZING 18 g 2   aspirin 81 MG EC tablet Take by mouth.     atorvastatin (LIPITOR) 10 MG tablet Take 1 tablet (10 mg total) by mouth at bedtime. 30 tablet 2   BREO ELLIPTA 100-25 MCG/INH AEPB Inhale 1 puff into the lungs daily.     carvedilol (COREG) 25 MG tablet Take 1 tablet (25 mg total) by mouth 2 (two) times daily. 60 tablet 2   cephALEXin (KEFLEX) 500 MG capsule Take 1 capsule (500 mg total) by mouth 4 (four) times daily. 20 capsule 0   diphenhydrAMINE (BENADRYL) 25 mg capsule Take by mouth.     gabapentin (NEURONTIN) 300 MG capsule Take 1 capsule by mouth 4 (four) times daily.     glucose blood test strip Check sugars twice daily - pre and post prandially     insulin aspart (NOVOLOG) 100 UNIT/ML injection Inject 10 Units into the skin 3 (three)  times daily before meals. 10 mL 11   insulin glargine, 2 Unit Dial, (TOUJEO MAX SOLOSTAR) 300 UNIT/ML Solostar Pen Inject 66 Units into the skin Nightly. 3 mL 0   LORazepam (ATIVAN) 0.5 MG tablet Take 0.5 mg by mouth 2 (two) times daily as needed.     losartan (COZAAR) 100 MG tablet Take 1 tablet by mouth daily.     methocarbamol (ROBAXIN) 750 MG tablet Take 750 mg by mouth 3 (three) times daily as needed.     omeprazole (PRILOSEC) 40 MG capsule Take 1 capsule by mouth daily.     ondansetron (ZOFRAN ODT) 4 MG disintegrating tablet Take 1 tablet (4 mg total) by mouth every 8 (eight) hours as needed for nausea or vomiting. 20 tablet 0   OZEMPIC, 0.25 OR 0.5 MG/DOSE, 2 MG/1.5ML SOPN Inject 0.5 mg into the skin once a week. 1.5 mL 2   potassium chloride (KLOR-CON) 10 MEQ tablet Take 1 tablet (10 mEq total) by mouth daily. 30 tablet 2   sertraline  (ZOLOFT) 100 MG tablet Take 1.5 tablets by mouth daily.     traMADol (ULTRAM) 50 MG tablet Take 50-100 mg by mouth every 6 (six) hours as needed.     amLODipine (NORVASC) 10 MG tablet Take 1 tablet by mouth daily.     cloNIDine (CATAPRES) 0.1 MG tablet Take 0.1 mg by mouth 2 (two) times daily.     furosemide (LASIX) 20 MG tablet Take by mouth.     No facility-administered medications prior to visit.    No Known Allergies  Review of Systems All review of systems negative except what is listed in the HPI     Objective:    Physical Exam Vitals reviewed.  Constitutional:      Appearance: Normal appearance.  Cardiovascular:     Rate and Rhythm: Regular rhythm.     Heart sounds: Normal heart sounds.  Pulmonary:     Breath sounds: Normal breath sounds.  Genitourinary:    Comments: Difficult to assess as patient was standing, stating she couldn't lie on exam table, no obvious abnormal findings Skin:    General: Skin is warm and dry.     Capillary Refill: Capillary refill takes less than 2 seconds.     Findings: Rash present.     Comments: Bilateral groin and lower abdominal folds with mild erythema, yeast-like appearance. Right side slightly worse than left  Neurological:     General: No focal deficit present.     Mental Status: She is alert and oriented to person, place, and time. Mental status is at baseline.  Psychiatric:        Mood and Affect: Mood normal.        Behavior: Behavior normal.        Thought Content: Thought content normal.        Judgment: Judgment normal.    BP (!) 159/78   Pulse 70   Temp 98.1 F (36.7 C)   Resp 18   SpO2 99%  Wt Readings from Last 3 Encounters:  08/10/20 267 lb 8 oz (121.3 kg)    Health Maintenance Due  Topic Date Due   HEMOGLOBIN A1C  Never done   FOOT EXAM  Never done   OPHTHALMOLOGY EXAM  Never done   HIV Screening  Never done   Hepatitis C Screening  Never done   PAP SMEAR-Modifier  Never done   Zoster Vaccines- Shingrix  (1 of 2) Never done   COVID-19 Vaccine (3 -  Booster for [employer] series) 11/16/2019   MAMMOGRAM  04/20/2020    There are no preventive care reminders to display for this patient.   No results found for: TSH Lab Results  Component Value Date   WBC 8.8 07/25/2020   HGB 12.0 07/25/2020   HCT 36.8 07/25/2020   MCV 91.1 07/25/2020   PLT 439 (H) 07/25/2020   Lab Results  Component Value Date   NA 135 07/25/2020   K 4.6 07/25/2020   CO2 28 07/25/2020   GLUCOSE 179 (H) 07/25/2020   BUN 17 07/25/2020   CREATININE 1.22 (H) 07/25/2020   BILITOT 0.8 07/25/2020   ALKPHOS 54 07/25/2020   AST 18 07/25/2020   ALT 15 07/25/2020   PROT 7.5 07/25/2020   ALBUMIN 3.3 (L) 07/25/2020   CALCIUM 9.1 07/25/2020   ANIONGAP 6 07/25/2020   No results found for: CHOL No results found for: HDL No results found for: LDLCALC No results found for: TRIG No results found for: CHOLHDL No results found for: GMWN0U     Assessment & Plan:   1. Skin yeast infection Skin irritation appears yeastlike.  We will go ahead and give her a dose of Diflucan, but will also send some nystatin powder that she can use as needed when this flares.  I do believe that her blood sugars running so high is making her more prone to yeast.  Went ahead and did a wet prep to check for any internal infections -we will let her know when results are in. Patient aware of signs/symptoms requiring further/urgent evaluation.  - WET PREP FOR TRICH, YEAST, CLUE - fluconazole (DIFLUCAN) 150 MG tablet; Take 1 tablet (150 mg total) by mouth once for 1 dose.  Dispense: 1 tablet; Refill: 1 - nystatin (MYCOSTATIN/NYSTOP) powder; Apply 1 application topically 3 (three) times daily.  Dispense: 15 g; Refill: 0  2. Hypertension associated with diabetes (HCC) BP elevated today although patient states she has not taken any medications today because she has not eaten yet.  Reinforced the importance of taking medications regularly and checking her blood  pressure at home.  States it tends to be better at home.  Refilling medications she is low on today.  Would like her to follow-up with PCP in about 2 weeks and bring a blood pressure and blood sugar log to go over with him. Patient aware of signs/symptoms requiring further/urgent evaluation.  - amLODipine (NORVASC) 10 MG tablet; Take 1 tablet (10 mg total) by mouth daily.  Dispense: 30 tablet; Refill: 1 - cloNIDine (CATAPRES) 0.1 MG tablet; Take 1 tablet (0.1 mg total) by mouth 2 (two) times daily.  Dispense: 60 tablet; Refill: 1 - furosemide (LASIX) 20 MG tablet; Take 1 tablet (20 mg total) by mouth daily.  Dispense: 30 tablet; Refill: 1  Follow-up in 2 weeks with PCP to see if rash is improving and chronic disease management.   Lollie Marrow Reola Calkins, DNP, FNP-C

## 2020-11-01 NOTE — Progress Notes (Signed)
The wet prep from yesterday was normal. Continue with the treatment plan we discussed for external yeast infection and follow-up with PCP in 2 weeks. Be sure to bring a log of your blood pressures and blood sugars to that appointment.

## 2020-11-08 ENCOUNTER — Telehealth: Payer: Self-pay

## 2020-11-08 NOTE — Telephone Encounter (Signed)
She can try OTC clotrimazole cream.  She should have refill of fluconazole.  Recommend repeat dose of this.  If not improving after this recommend follow up.    Thanks!  CM

## 2020-11-08 NOTE — Telephone Encounter (Signed)
Pt saw Ladona Ridgel on 7/12 for an external yeast infection. Pt called today and stated that it has not improved and is still very itchy. Pt has used nystatin powder without much relief. Pt wanted to know if there's anything else that might help? Please advise, thanks.

## 2020-11-09 NOTE — Telephone Encounter (Signed)
Pt notified. She states she will pick up the refill.

## 2020-11-14 ENCOUNTER — Ambulatory Visit (INDEPENDENT_AMBULATORY_CARE_PROVIDER_SITE_OTHER): Payer: Medicaid Other | Admitting: Family Medicine

## 2020-11-14 ENCOUNTER — Encounter: Payer: Self-pay | Admitting: Family Medicine

## 2020-11-14 ENCOUNTER — Other Ambulatory Visit: Payer: Self-pay

## 2020-11-14 VITALS — BP 139/78 | HR 74 | Temp 98.0°F | Ht 62.0 in | Wt 254.0 lb

## 2020-11-14 DIAGNOSIS — E1165 Type 2 diabetes mellitus with hyperglycemia: Secondary | ICD-10-CM

## 2020-11-14 DIAGNOSIS — Z794 Long term (current) use of insulin: Secondary | ICD-10-CM

## 2020-11-14 DIAGNOSIS — I152 Hypertension secondary to endocrine disorders: Secondary | ICD-10-CM

## 2020-11-14 DIAGNOSIS — E1159 Type 2 diabetes mellitus with other circulatory complications: Secondary | ICD-10-CM

## 2020-11-14 DIAGNOSIS — R21 Rash and other nonspecific skin eruption: Secondary | ICD-10-CM

## 2020-11-14 DIAGNOSIS — M961 Postlaminectomy syndrome, not elsewhere classified: Secondary | ICD-10-CM | POA: Diagnosis not present

## 2020-11-14 MED ORDER — METHOCARBAMOL 750 MG PO TABS: 750.0000 mg | ORAL_TABLET | Freq: Three times a day (TID) | ORAL | 3 refills | Status: DC | PRN

## 2020-11-14 MED ORDER — GABAPENTIN 300 MG PO CAPS: 300.0000 mg | ORAL_CAPSULE | Freq: Four times a day (QID) | ORAL | 1 refills | Status: DC

## 2020-11-14 MED ORDER — POTASSIUM CHLORIDE ER 10 MEQ PO TBCR: 10.0000 meq | EXTENDED_RELEASE_TABLET | Freq: Every day | ORAL | 2 refills | Status: DC

## 2020-11-14 MED ORDER — CARVEDILOL 25 MG PO TABS: 25.0000 mg | ORAL_TABLET | Freq: Two times a day (BID) | ORAL | 2 refills | Status: DC

## 2020-11-14 MED ORDER — FLUCONAZOLE 150 MG PO TABS: 150.0000 mg | ORAL_TABLET | ORAL | 0 refills | Status: AC

## 2020-11-14 MED ORDER — TRAMADOL HCL 50 MG PO TABS: 50.0000 mg | ORAL_TABLET | Freq: Four times a day (QID) | ORAL | 0 refills | Status: DC | PRN

## 2020-11-14 MED ORDER — OMEPRAZOLE 40 MG PO CPDR: 40.0000 mg | DELAYED_RELEASE_CAPSULE | Freq: Every day | ORAL | 1 refills | Status: DC

## 2020-11-14 NOTE — Patient Instructions (Signed)
Nice to see you today! I have renewed your medication for pain and placed a referral to pain management.  For the rash take fluconazole 150mg  every three days for 4 doses

## 2020-11-16 ENCOUNTER — Telehealth: Payer: Self-pay

## 2020-11-16 ENCOUNTER — Other Ambulatory Visit: Payer: Self-pay | Admitting: Family Medicine

## 2020-11-16 MED ORDER — LANTUS SOLOSTAR 100 UNIT/ML ~~LOC~~ SOPN: 35.0000 [IU] | PEN_INJECTOR | Freq: Two times a day (BID) | SUBCUTANEOUS | 3 refills | Status: DC

## 2020-11-16 NOTE — Telephone Encounter (Signed)
Rx changed to Lantus as this is listed preferred drug under [place] medicaid.  Recommend change to trulicity as well as this is preferred over Ozempic.    CM

## 2020-11-16 NOTE — Telephone Encounter (Signed)
Pt called stating unable to get Toujeo.   PA needs to be started for medication.   Wants to know, " Am I gonna die since I haven't had Toujeo for 2 days and PA could take a few more. I don't know how to adjust the Novolog to compensate."  Please advise.   I will start the Toujeo and Ozempic PA's today.

## 2020-11-16 NOTE — Telephone Encounter (Signed)
Pt has been advised.   States Crisis Control found 4 boxes of Toujeo.  She will take Toujeo and then switch to Lantus.   Starting PA for Trulicity per patient agreement.

## 2020-11-19 DIAGNOSIS — R21 Rash and other nonspecific skin eruption: Secondary | ICD-10-CM | POA: Insufficient documentation

## 2020-11-19 NOTE — Progress Notes (Signed)
Gabriela Guerrero - 60 y.o. female MRN [id number]  Date of birth: 1961-01-15  Subjective Chief Complaint  Patient presents with   Rash    Slight improvement   Chronic Care Management    HPI Gabriela Guerrero is a 60 year old female here today for follow-up visit.  She continues to have problems with chronic low back pain.  She has had prior surgery however continues to have pain related to this.  She was seeing Dr. Laurian Brim and was receiving injections.  These were somewhat helpful.  She has been prescribed tramadol, Robaxin and gabapentin as well for management of her pain.  She did see Dr. Jon Gills nurse practitioner previously as he was out of the clinic for extended period.  She continues to have difficulty with ambulation and uses a cane to ambulate.  She continues on combination of carvedilol, amlodipine, furosemide and losartan for management of hypertension.  She does need refill on carvedilol today.  She is taking all other listed medications.  She denies any side effects related to these.  She has not had chest pain, shortness of breath, palpitations, headache or vision changes.  I reviewed her sugars today which have been in the 100-140 range.  She denies any symptoms of hypoglycemia.  She is using Ozempic weekly in addition to [employer] and NovoLog.  She is also seen recently for rash in her groin area.  She was started on nystatin powder as well as given fluconazole x2.  This has improved but not resolved.  ROS:  A comprehensive ROS was completed and negative except as noted per HPI    No Known Allergies  History reviewed. No pertinent past medical history.  Past Surgical History:  Procedure Laterality Date   LUMBAR FUSION      Social History   Socioeconomic History   Marital status: Single    Spouse name: Not on file   Number of children: Not on file   Years of education: Not on file   Highest education level: Not on file  Occupational History   Occupation: Disabled  Tobacco Use    Smoking status: Never   Smokeless tobacco: Never  Vaping Use   Vaping Use: Never used  Substance and Sexual Activity   Alcohol use: Never   Drug use: Never   Sexual activity: Not Currently    Partners: Male    Birth control/protection: Abstinence  Other Topics Concern   Not on file  Social History Narrative   Not on file   Social Determinants of Health   Financial Resource Strain: Not on file  Food Insecurity: Not on file  Transportation Needs: Not on file  Physical Activity: Not on file  Stress: Not on file  Social Connections: Not on file    Family History  Problem Relation Age of Onset   Hypertension Mother    Breast cancer Mother    Hypertension Sister    Stroke Sister     Health Maintenance  Topic Date Due   HEMOGLOBIN A1C  Never done   FOOT EXAM  Never done   OPHTHALMOLOGY EXAM  Never done   HIV Screening  Never done   Hepatitis C Screening  Never done   PAP SMEAR-Modifier  Never done   MAMMOGRAM  04/20/2020   COVID-19 Vaccine (3 - Booster for [employer] series) 11/30/2020 (Originally 11/16/2019)   Zoster Vaccines- Shingrix (1 of 2) 02/14/2021 (Originally 03/27/2011)   INFLUENZA VACCINE  11/20/2020   TETANUS/TDAP  06/26/2025   COLONOSCOPY (Pts 45-28yrs Insurance coverage will  need to be confirmed)  01/31/2029   PNEUMOCOCCAL POLYSACCHARIDE VACCINE AGE 20-64 HIGH RISK  Completed   Pneumococcal Vaccine 73-36 Years old  Aged Out   HPV VACCINES  Aged Out     ----------------------------------------------------------------------------------------------------------------------------------------------------------------------------------------------------------------- Physical Exam BP 139/78   Pulse 74   Temp 98 F (36.7 C)   Ht 5\' 2"  (1.575 m)   Wt 254 lb (115.2 kg)   SpO2 99%   BMI 46.46 kg/m   Physical Exam HENT:     Head: Normocephalic and atraumatic.  Cardiovascular:     Rate and Rhythm: Normal rate and regular rhythm.  Pulmonary:     Effort:  Pulmonary effort is normal.     Breath sounds: Normal breath sounds.  Musculoskeletal:     Cervical back: Neck supple.     Comments: She walks with antalgic gait, using cane.  Neurological:     General: No focal deficit present.     Mental Status: She is alert and oriented to person, place, and time.  Psychiatric:        Mood and Affect: Mood normal.    ------------------------------------------------------------------------------------------------------------------------------------------------------------------------------------------------------------------- Assessment and Plan  Hypertension associated with diabetes (HCC) Her blood pressure is well controlled at this time.  I recommend continuation of current medications.  Her carvedilol was refilled today.  Low-sodium diet and weight loss encouraged.  Uncontrolled type 2 diabetes mellitus with hyperglycemia, with long-term current use of insulin (HCC) Readings on her blood sugar have been improved at home.  She will continue Toujeo with NovoLog and Ozempic weekly.  Failed back syndrome of lumbar spine I renewed her tramadol as well as methocarbamol and gabapentin.  Unfortunately she has been dismissed from Dr. office for missed appointments.  I will refer her to another pain clinic for continued comprehensive pain management.  Rash She has had improvement with antifungal however has not completely resolved.  We will extend course of fluconazole over the next couple weeks   Meds ordered this encounter  Medications   carvedilol (COREG) 25 MG tablet    Sig: Take 1 tablet (25 mg total) by mouth 2 (two) times daily.    Dispense:  60 tablet    Refill:  2   gabapentin (NEURONTIN) 300 MG capsule    Sig: Take 1 capsule (300 mg total) by mouth 4 (four) times daily.    Dispense:  360 capsule    Refill:  1   methocarbamol (ROBAXIN) 750 MG tablet    Sig: Take 1 tablet (750 mg total) by mouth 3 (three) times daily as needed.     Dispense:  90 tablet    Refill:  3   traMADol (ULTRAM) 50 MG tablet    Sig: Take 1-2 tablets (50-100 mg total) by mouth every 6 (six) hours as needed.    Dispense:  30 tablet    Refill:  0   omeprazole (PRILOSEC) 40 MG capsule    Sig: Take 1 capsule (40 mg total) by mouth daily.    Dispense:  90 capsule    Refill:  1   potassium chloride (KLOR-CON) 10 MEQ tablet    Sig: Take 1 tablet (10 mEq total) by mouth daily.    Dispense:  90 tablet    Refill:  2   fluconazole (DIFLUCAN) 150 MG tablet    Sig: Take 1 tablet (150 mg total) by mouth every 3 (three) days for 4 doses.    Dispense:  4 tablet    Refill:  0  No follow-ups on file.    This visit occurred during the SARS-CoV-2 public health emergency.  Safety protocols were in place, including screening questions prior to the visit, additional usage of staff PPE, and extensive cleaning of exam room while observing appropriate contact time as indicated for disinfecting solutions.

## 2020-11-19 NOTE — Assessment & Plan Note (Signed)
Readings on her blood sugar have been improved at home.  She will continue Toujeo with NovoLog and Ozempic weekly.

## 2020-11-19 NOTE — Assessment & Plan Note (Signed)
She has had improvement with antifungal however has not completely resolved.  We will extend course of fluconazole over the next couple weeks

## 2020-11-19 NOTE — Assessment & Plan Note (Signed)
I renewed her tramadol as well as methocarbamol and gabapentin.  Unfortunately she has been dismissed from Dr. Jon Gills office for missed appointments.  I will refer her to another pain clinic for continued comprehensive pain management.

## 2020-11-19 NOTE — Assessment & Plan Note (Signed)
Her blood pressure is well controlled at this time.  I recommend continuation of current medications.  Her carvedilol was refilled today.  Low-sodium diet and weight loss encouraged.

## 2020-11-23 ENCOUNTER — Other Ambulatory Visit: Payer: Self-pay

## 2020-11-23 DIAGNOSIS — I152 Hypertension secondary to endocrine disorders: Secondary | ICD-10-CM

## 2020-11-23 DIAGNOSIS — E1159 Type 2 diabetes mellitus with other circulatory complications: Secondary | ICD-10-CM

## 2020-11-23 MED ORDER — LOSARTAN POTASSIUM 100 MG PO TABS: 100.0000 mg | ORAL_TABLET | Freq: Every day | ORAL | 1 refills | Status: DC

## 2020-11-28 ENCOUNTER — Telehealth: Payer: Self-pay

## 2020-11-28 NOTE — Telephone Encounter (Signed)
Pt lvm requesting instructions for taking Lantus.   Per Dr. Ashley Royalty, 35 units BID.   Attempted to contact patient. No answer. VM full.

## 2020-11-29 NOTE — Telephone Encounter (Signed)
2nd attempt to contact patient concerning Lantus.  Lvm advising 35 units of Lantus BID. She's also to take [employer].

## 2020-12-22 ENCOUNTER — Telehealth: Payer: Self-pay

## 2020-12-22 NOTE — Telephone Encounter (Signed)
Pt has been advised to contact: [facility] at [phone]  Referral was place in July.   Directed patient to contact local Social Security concerning disability process.

## 2020-12-26 ENCOUNTER — Telehealth: Payer: Self-pay

## 2020-12-26 NOTE — Telephone Encounter (Signed)
Pt lvm stating:  1. Applying for SS Disability online.   2. She received a message from referral to Dr. Oneal Grout stating his office did not participate with her insurance. They stated that a letter was sent to the office on 11/20/20.  Requesting another option.   Message sent to Dr. Ashley Royalty.

## 2020-12-28 ENCOUNTER — Other Ambulatory Visit: Payer: Self-pay | Admitting: Family Medicine

## 2020-12-28 DIAGNOSIS — M961 Postlaminectomy syndrome, not elsewhere classified: Secondary | ICD-10-CM

## 2020-12-28 NOTE — Telephone Encounter (Signed)
LVM for pt to call to discuss.  T. Ladelle Teodoro, CMA  

## 2020-12-28 NOTE — Telephone Encounter (Signed)
New referral for pain mgmt sent in to see if there is somewhere that will take her insurance.

## 2020-12-29 NOTE — Telephone Encounter (Signed)
LVM informing pt of new referral.  Gabriela Guerrero, CMA

## 2021-01-31 ENCOUNTER — Telehealth: Payer: Self-pay

## 2021-01-31 NOTE — Telephone Encounter (Signed)
Received call from pharmacy.   Pt is to take [employer] and Novolog.   Toujeo is replacing the Lantus.  Returned call to the pharmacy.

## 2021-03-05 ENCOUNTER — Other Ambulatory Visit: Payer: Self-pay | Admitting: Family Medicine

## 2021-03-07 ENCOUNTER — Other Ambulatory Visit: Payer: Self-pay

## 2021-03-07 DIAGNOSIS — E1159 Type 2 diabetes mellitus with other circulatory complications: Secondary | ICD-10-CM

## 2021-03-07 DIAGNOSIS — I152 Hypertension secondary to endocrine disorders: Secondary | ICD-10-CM

## 2021-03-07 MED ORDER — LORAZEPAM 0.5 MG PO TABS: 0.5000 mg | ORAL_TABLET | Freq: Two times a day (BID) | ORAL | 0 refills | Status: DC | PRN

## 2021-03-07 MED ORDER — CLONIDINE HCL 0.1 MG PO TABS: 0.1000 mg | ORAL_TABLET | Freq: Two times a day (BID) | ORAL | 1 refills | Status: DC

## 2021-03-13 ENCOUNTER — Telehealth: Payer: Self-pay

## 2021-03-13 NOTE — Telephone Encounter (Signed)
Pt lvm requesting information concerning Disability Placard Renewal.   Returned pt's call. LVM advising placard form will be available at [facility] for pick-up. She will need to take the form to the [facility].

## 2021-03-22 ENCOUNTER — Emergency Department (HOSPITAL_COMMUNITY)
Admission: EM | Admit: 2021-03-22 | Discharge: 2021-03-22 | Disposition: A | Discharge status: Home / Self Care | Payer: Medicaid Other | Attending: Emergency Medicine | Admitting: Emergency Medicine

## 2021-03-22 ENCOUNTER — Encounter (HOSPITAL_COMMUNITY): Payer: Self-pay | Admitting: Emergency Medicine

## 2021-03-22 DIAGNOSIS — Z5321 Procedure and treatment not carried out due to patient leaving prior to being seen by health care provider: Secondary | ICD-10-CM | POA: Insufficient documentation

## 2021-03-22 DIAGNOSIS — R531 Weakness: Secondary | ICD-10-CM | POA: Diagnosis present

## 2021-03-22 LAB — CBC WITH DIFFERENTIAL/PLATELET
Abs Immature Granulocytes: 0.02 10*3/uL (ref 0.00–0.07)
Basophils Absolute: 0 10*3/uL (ref 0.0–0.1)
Basophils Relative: 1 %
Eosinophils Absolute: 0.4 10*3/uL (ref 0.0–0.5)
Eosinophils Relative: 5 %
HCT: 34.8 % — ABNORMAL LOW (ref 36.0–46.0)
Hemoglobin: 11.3 g/dL — ABNORMAL LOW (ref 12.0–15.0)
Immature Granulocytes: 0 %
Lymphocytes Relative: 29 %
Lymphs Abs: 2.2 10*3/uL (ref 0.7–4.0)
MCH: 29.6 pg (ref 26.0–34.0)
MCHC: 32.5 g/dL (ref 30.0–36.0)
MCV: 91.1 fL (ref 80.0–100.0)
Monocytes Absolute: 0.7 10*3/uL (ref 0.1–1.0)
Monocytes Relative: 9 %
Neutro Abs: 4.4 10*3/uL (ref 1.7–7.7)
Neutrophils Relative %: 56 %
Platelets: 360 10*3/uL (ref 150–400)
RBC: 3.82 MIL/uL — ABNORMAL LOW (ref 3.87–5.11)
RDW: 14.4 % (ref 11.5–15.5)
WBC: 7.7 10*3/uL (ref 4.0–10.5)
nRBC: 0 % (ref 0.0–0.2)

## 2021-03-22 LAB — URINALYSIS, ROUTINE W REFLEX MICROSCOPIC
Bilirubin Urine: NEGATIVE
Glucose, UA: 100 mg/dL — AB
Hgb urine dipstick: NEGATIVE
Ketones, ur: NEGATIVE mg/dL
Leukocytes,Ua: NEGATIVE
Nitrite: NEGATIVE
Protein, ur: 30 mg/dL — AB
Specific Gravity, Urine: 1.015 (ref 1.005–1.030)
pH: 6.5 (ref 5.0–8.0)

## 2021-03-22 LAB — URINALYSIS, MICROSCOPIC (REFLEX)

## 2021-03-22 LAB — COMPREHENSIVE METABOLIC PANEL
ALT: 18 U/L (ref 0–44)
AST: 13 U/L — ABNORMAL LOW (ref 15–41)
Albumin: 3.2 g/dL — ABNORMAL LOW (ref 3.5–5.0)
Alkaline Phosphatase: 58 U/L (ref 38–126)
Anion gap: 6 (ref 5–15)
BUN: 15 mg/dL (ref 6–20)
CO2: 29 mmol/L (ref 22–32)
Calcium: 9.2 mg/dL (ref 8.9–10.3)
Chloride: 99 mmol/L (ref 98–111)
Creatinine, Ser: 1.27 mg/dL — ABNORMAL HIGH (ref 0.44–1.00)
GFR, Estimated: 49 mL/min — ABNORMAL LOW (ref >60)
Glucose, Bld: 266 mg/dL — ABNORMAL HIGH (ref 70–99)
Potassium: 4 mmol/L (ref 3.5–5.1)
Sodium: 134 mmol/L — ABNORMAL LOW (ref 135–145)
Total Bilirubin: 0.7 mg/dL (ref 0.3–1.2)
Total Protein: 7.7 g/dL (ref 6.5–8.1)

## 2021-03-22 NOTE — ED Provider Notes (Signed)
Emergency Medicine Provider Triage Evaluation Note  Gabriela Guerrero , a 60 y.o. female  was evaluated in triage.  Pt complains of generalized weakness, feels like "like I got the flu ".  Without any focal point of tenderness.  Does have prior chronic complaints prior medical history.  Notes are within normal limits.  Review of Systems  Positive: Weakness, chills Negative: Fever, headache, abdominal pain  Physical Exam  BP (!) 158/70 (BP Location: Right Arm)   Pulse (!) 59   Temp 97.7 F (36.5 C) (Oral)   Resp 18   SpO2 99%  Gen:   Awake, no distress   Resp:  Normal effort  MSK:   Moves extremities without difficulty  Other:    Medical Decision Making  Medically screening exam initiated at 7:24 PM.  Appropriate orders placed.  Gabriela Guerrero was informed that the remainder of the evaluation will be completed by another provider, this initial triage assessment does not replace that evaluation, and the importance of remaining in the ED until their evaluation is complete.     Claude Manges, PA-C 03/22/21 1925    Mancel Bale, MD 03/22/21 [phone]

## 2021-03-22 NOTE — ED Notes (Signed)
Pt called for VS recheck. Pt stated that she did not want to stay to be seen. Pt helped to the toilet, urine sample obtained. Pt helped out to the car with family member.

## 2021-03-22 NOTE — ED Triage Notes (Signed)
Patient BIB [facility] from home for evaluation of generalized weakness over the last several days. Patient alert, oriented, and in on apparent distress at this time.

## 2021-03-23 ENCOUNTER — Telehealth: Payer: Self-pay

## 2021-03-23 NOTE — Telephone Encounter (Signed)
Transition Care Management Unsuccessful Follow-up Telephone Call  Date of discharge and from where:  03/22/2021 from [facility]  Attempts:  1st Attempt  Reason for unsuccessful TCM follow-up call:  Left voice message

## 2021-03-26 NOTE — Telephone Encounter (Signed)
Transition Care Management Unsuccessful Follow-up Telephone Call  Date of discharge and from where:  03/22/2021 from [facility]  Attempts:  2nd Attempt  Reason for unsuccessful TCM follow-up call:  Left voice message

## 2021-03-27 NOTE — Telephone Encounter (Signed)
Transition Care Management Follow-up Telephone Call Date of discharge and from where: 03/22/2021 from [facility] How have you been since you were released from the hospital? Pt stated that she is feeling better and did not have any questions or concerns.  Any questions or concerns? No  Items Reviewed: Did the pt receive and understand the discharge instructions provided? Yes  Medications obtained and verified? Yes  Other? No  Any new allergies since your discharge? No  Dietary orders reviewed? No Do you have support at home? Yes   Functional Questionnaire: (I = Independent and D = Dependent) ADLs: I  Bathing/Dressing- I  Meal Prep- I  Eating- I  Maintaining continence- I  Transferring/Ambulation- I  Managing Meds- I   Follow up appointments reviewed:  PCP Hospital f/u appt confirmed? No   Specialist Hospital f/u appt confirmed? No   Are transportation arrangements needed? No  If their condition worsens, is the pt aware to call PCP or go to the Emergency Dept.? Yes Was the patient provided with contact information for the PCP's office or ED? Yes Was to pt encouraged to call back with questions or concerns? Yes

## 2021-05-01 ENCOUNTER — Other Ambulatory Visit: Payer: Self-pay | Admitting: Family Medicine

## 2021-05-03 ENCOUNTER — Telehealth: Payer: Self-pay

## 2021-05-03 NOTE — Telephone Encounter (Signed)
Please contact patient and provide fax number.   Pt needs disability paperwork completed. Please advise of office protocol.   We also need the fax number to return the completed documentation.   Thank you

## 2021-05-03 NOTE — Telephone Encounter (Signed)
Patient said fax number to fax paperwork is: [phone] Also, patient was told about the $29 fee before we will fax the paperwork for her.    Patient was ALSO questioning about paperwork, there is A LOT that she does not understand about the health part of it. She was asking that there is a part that says "what were you able to do before your illness / injury "? 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 Also, one part says "check any other following items that illness affects" and patient stated it is a small box, she was wondering how to word everything into such a small box?  She was asking is there something on her MyChart that can help her describe these things? She stated she had a MyChart with [facility] and one with  and that she does not use either right now and is there something that can help her fill out that portion?  Please Advise

## 2021-05-04 ENCOUNTER — Telehealth (INDEPENDENT_AMBULATORY_CARE_PROVIDER_SITE_OTHER): Payer: Self-pay | Admitting: Medical-Surgical

## 2021-05-04 ENCOUNTER — Other Ambulatory Visit: Payer: Self-pay

## 2021-05-04 ENCOUNTER — Encounter: Payer: Self-pay | Admitting: Medical-Surgical

## 2021-05-04 DIAGNOSIS — Z91199 Patient's noncompliance with other medical treatment and regimen due to unspecified reason: Secondary | ICD-10-CM

## 2021-05-04 NOTE — Progress Notes (Signed)
Called at 11:00, no answer, VM left, links sent

## 2021-05-04 NOTE — Progress Notes (Signed)
Called at 11:18am, no answer, left VM.  Called again at 11:24am, no answer.  Logged onto MyChart video visit for 11 minutes after links sent. Patient did not join to complete appointment.   ___________________________________________ Thayer Ohm, DNP, APRN, FNP-BC Primary Care and Sports Medicine [facility] [place]

## 2021-05-10 ENCOUNTER — Telehealth: Payer: Self-pay

## 2021-05-10 NOTE — Telephone Encounter (Signed)
Pt lvm stating she had right breast discomfort. She would like to know if a mammogram could be scheduled or should she see Dr. Zigmund Daniel 1st.   Front Desk: Please contact pt and schedule appt with Dr. Zigmund Daniel for breast discomfort. Try to schedule her for next week. Thanks

## 2021-05-11 NOTE — Telephone Encounter (Signed)
Patient has been scheduled for Monday with PCP, used a Same day for this, okay'd by you.

## 2021-05-14 ENCOUNTER — Ambulatory Visit: Payer: Medicaid Other | Admitting: Family Medicine

## 2021-05-14 ENCOUNTER — Encounter: Payer: Self-pay | Admitting: Family Medicine

## 2021-05-14 ENCOUNTER — Other Ambulatory Visit: Payer: Self-pay

## 2021-05-14 DIAGNOSIS — N644 Mastodynia: Secondary | ICD-10-CM

## 2021-05-14 DIAGNOSIS — B372 Candidiasis of skin and nail: Secondary | ICD-10-CM | POA: Diagnosis not present

## 2021-05-14 MED ORDER — NYSTATIN 100000 UNIT/GM EX POWD: 1.0000 "application " | Freq: Three times a day (TID) | CUTANEOUS | 0 refills | Status: DC

## 2021-05-14 MED ORDER — FLUTICASONE FUROATE-VILANTEROL 100-25 MCG/ACT IN AEPB: 1.0000 | INHALATION_SPRAY | Freq: Every day | RESPIRATORY_TRACT | 6 refills | Status: DC

## 2021-05-14 MED ORDER — CEPHALEXIN 500 MG PO CAPS: 500.0000 mg | ORAL_CAPSULE | Freq: Four times a day (QID) | ORAL | 0 refills | Status: DC

## 2021-05-14 NOTE — Assessment & Plan Note (Signed)
We will treat empirically for cellulitis of the breast with Keflex 500 mg 4 times daily x10 days.  Discussed that if symptoms are not improving with this would recommend we obtain diagnostic imaging of the breast.

## 2021-05-14 NOTE — Patient Instructions (Signed)
Start cephalexin 4 times per day for 10 days.  Let me know if not improving with this.

## 2021-05-14 NOTE — Progress Notes (Signed)
Gabriela Guerrero - 61 y.o. female MRN [id number]  Date of birth: 04-Mar-1961  Subjective No chief complaint on file.   HPI Gabriela Guerrero is a 61-year-old female here today with complaint of right breast pain.  Reports that symptoms started a few days ago.  Noticed tender area along underside of right breast while showering.  She has not noticed any swelling, mass, bleeding or drainage.  ROS:  A comprehensive ROS was completed and negative except as noted per HPI  No Known Allergies  History reviewed. No pertinent past medical history.  Past Surgical History:  Procedure Laterality Date   LUMBAR FUSION      Social History   Socioeconomic History   Marital status: Single    Spouse name: Not on file   Number of children: Not on file   Years of education: Not on file   Highest education level: Not on file  Occupational History   Occupation: Disabled  Tobacco Use   Smoking status: Never   Smokeless tobacco: Never  Vaping Use   Vaping Use: Never used  Substance and Sexual Activity   Alcohol use: Never   Drug use: Never   Sexual activity: Not Currently    Partners: Male    Birth control/protection: Abstinence  Other Topics Concern   Not on file  Social History Narrative   Not on file   Social Determinants of Health   Financial Resource Strain: Not on file  Food Insecurity: Not on file  Transportation Needs: Not on file  Physical Activity: Not on file  Stress: Not on file  Social Connections: Not on file    Family History  Problem Relation Age of Onset   Hypertension Mother    Breast cancer Mother    Hypertension Sister    Stroke Sister     Health Maintenance  Topic Date Due   HEMOGLOBIN A1C  Never done   FOOT EXAM  Never done   OPHTHALMOLOGY EXAM  Never done   HIV Screening  Never done   Hepatitis C Screening  Never done   PAP SMEAR-Modifier  Never done   Zoster Vaccines- Shingrix (1 of 2) Never done   COVID-19 Vaccine (3 - Booster for Pfizer series) 08/14/2019    MAMMOGRAM  04/20/2020   INFLUENZA VACCINE  11/20/2020   TETANUS/TDAP  06/26/2025   COLONOSCOPY (Pts 45-43yrs Insurance coverage will need to be confirmed)  01/31/2029   HPV VACCINES  Aged Out     ----------------------------------------------------------------------------------------------------------------------------------------------------------------------------------------------------------------- Physical Exam BP (!) 147/86 (BP Location: Left Arm, Patient Position: Sitting, Cuff Size: Large)    Pulse 64    Ht 5\' 2"  (1.575 m)    Wt 268 lb (121.6 kg)    SpO2 100%    BMI 49.02 kg/m   Physical Exam Constitutional:      Appearance: Normal appearance.  Cardiovascular:     Rate and Rhythm: Normal rate and regular rhythm.  Pulmonary:     Effort: Pulmonary effort is normal.     Breath sounds: Normal breath sounds.  Chest:     Comments: There is some mild erythema under the right breast without any open ulcerations, palpable masses or abscesses. Musculoskeletal:     Cervical back: Neck supple.  Neurological:     Mental Status: She is alert.    ------------------------------------------------------------------------------------------------------------------------------------------------------------------------------------------------------------------- Assessment and Plan  Breast pain, right We will treat empirically for cellulitis of the breast with Keflex 500 mg 4 times daily x10 days.  Discussed that if symptoms are not improving  with this would recommend we obtain diagnostic imaging of the breast.   Meds ordered this encounter  Medications   cephALEXin (KEFLEX) 500 MG capsule    Sig: Take 1 capsule (500 mg total) by mouth 4 (four) times daily.    Dispense:  20 capsule    Refill:  0   nystatin (MYCOSTATIN/NYSTOP) powder    Sig: Apply 1 application topically 3 (three) times daily.    Dispense:  15 g    Refill:  0   fluticasone furoate-vilanterol (BREO ELLIPTA) 100-25  MCG/ACT AEPB    Sig: Inhale 1 puff into the lungs daily.    Dispense:  28 each    Refill:  6    No follow-ups on file.    This visit occurred during the SARS-CoV-2 public health emergency.  Safety protocols were in place, including screening questions prior to the visit, additional usage of staff PPE, and extensive cleaning of exam room while observing appropriate contact time as indicated for disinfecting solutions.

## 2021-05-18 ENCOUNTER — Telehealth: Payer: Medicaid Other | Admitting: Medical-Surgical

## 2021-05-29 ENCOUNTER — Other Ambulatory Visit: Payer: Self-pay | Admitting: Family Medicine

## 2021-05-29 DIAGNOSIS — I152 Hypertension secondary to endocrine disorders: Secondary | ICD-10-CM

## 2021-05-29 DIAGNOSIS — E1159 Type 2 diabetes mellitus with other circulatory complications: Secondary | ICD-10-CM

## 2021-06-04 ENCOUNTER — Telehealth: Payer: Self-pay

## 2021-06-04 NOTE — Telephone Encounter (Signed)
Pt lvm stating Breo Ellipta not covered by insurance.   Requesting replacement medication. Please advise.

## 2021-06-06 MED ORDER — MOMETASONE FURO-FORMOTEROL FUM 100-5 MCG/ACT IN AERO: 2.0000 | INHALATION_SPRAY | Freq: Two times a day (BID) | RESPIRATORY_TRACT | 6 refills | Status: DC

## 2021-06-06 NOTE — Addendum Note (Signed)
Addended by: Mammie Lorenzo on: 06/06/2021 04:59 PM   Modules accepted: Orders

## 2021-06-06 NOTE — Telephone Encounter (Signed)
Changed to [facility].   Thanks!  CM

## 2021-06-18 ENCOUNTER — Other Ambulatory Visit: Payer: Self-pay | Admitting: Family Medicine

## 2021-07-12 ENCOUNTER — Other Ambulatory Visit: Payer: Self-pay

## 2021-07-23 ENCOUNTER — Other Ambulatory Visit: Payer: Self-pay | Admitting: Family Medicine

## 2021-07-24 ENCOUNTER — Other Ambulatory Visit: Payer: Self-pay

## 2021-07-24 DIAGNOSIS — E1165 Type 2 diabetes mellitus with hyperglycemia: Secondary | ICD-10-CM

## 2021-07-24 MED ORDER — CARVEDILOL 25 MG PO TABS: 25.0000 mg | ORAL_TABLET | Freq: Two times a day (BID) | ORAL | 1 refills | Status: DC

## 2021-07-24 MED ORDER — ATORVASTATIN CALCIUM 10 MG PO TABS: 10.0000 mg | ORAL_TABLET | Freq: Every day | ORAL | 1 refills | Status: DC

## 2021-09-03 ENCOUNTER — Telehealth: Payer: Self-pay

## 2021-09-03 NOTE — Telephone Encounter (Signed)
Pt lvm on Friday concerning home health care.  ? ?Pt requested we contact [facility] [place], [place]) @ [phone] for documentation.  ? ?Attempted to contact [facility]. No answer. No vm. Callback information provided. ? ?Returned patients call. LVM for callback concerning the documents [facility] wants to fax and the care she would like to received. (Concerned [facility] is not a legitimate business entity). ?

## 2021-09-21 ENCOUNTER — Other Ambulatory Visit: Payer: Self-pay

## 2021-09-21 NOTE — Telephone Encounter (Signed)
Pt lvm requesting medication refill and Home Health Care referral.

## 2021-09-24 ENCOUNTER — Telehealth: Payer: Self-pay

## 2021-09-24 NOTE — Telephone Encounter (Signed)
Rcvd VM from patient requesting Home Health Aid referral placement by Dr. Ashley Royalty. Pt also requested tramadol refill.   Per Dr. Ashley Royalty, insurance requires in-person/virtual visit specifically for Home Health referrals.   Please call patient to schedule appt. Also, advise Tramadol was written as a one-time order. Not available for refills. Thanks

## 2021-09-24 NOTE — Telephone Encounter (Signed)
LVM for patient to call back to get an in person or virtual visit scheduled and that no refills on medication available. AMUCK

## 2021-09-26 ENCOUNTER — Other Ambulatory Visit: Payer: Self-pay | Admitting: Family Medicine

## 2021-09-26 DIAGNOSIS — E1159 Type 2 diabetes mellitus with other circulatory complications: Secondary | ICD-10-CM

## 2021-10-03 ENCOUNTER — Encounter: Payer: Self-pay | Admitting: Family Medicine

## 2021-10-03 ENCOUNTER — Telehealth (INDEPENDENT_AMBULATORY_CARE_PROVIDER_SITE_OTHER): Payer: Medicaid Other | Admitting: Family Medicine

## 2021-10-03 DIAGNOSIS — Z794 Long term (current) use of insulin: Secondary | ICD-10-CM

## 2021-10-03 DIAGNOSIS — F329 Major depressive disorder, single episode, unspecified: Secondary | ICD-10-CM

## 2021-10-03 DIAGNOSIS — E1159 Type 2 diabetes mellitus with other circulatory complications: Secondary | ICD-10-CM | POA: Diagnosis not present

## 2021-10-03 DIAGNOSIS — E1165 Type 2 diabetes mellitus with hyperglycemia: Secondary | ICD-10-CM | POA: Diagnosis not present

## 2021-10-03 DIAGNOSIS — M961 Postlaminectomy syndrome, not elsewhere classified: Secondary | ICD-10-CM | POA: Diagnosis not present

## 2021-10-03 DIAGNOSIS — I152 Hypertension secondary to endocrine disorders: Secondary | ICD-10-CM

## 2021-10-03 MED ORDER — ONDANSETRON 4 MG PO TBDP: 4.0000 mg | ORAL_TABLET | Freq: Three times a day (TID) | ORAL | 0 refills | Status: DC | PRN

## 2021-10-03 MED ORDER — TRAMADOL HCL 50 MG PO TABS: 50.0000 mg | ORAL_TABLET | Freq: Four times a day (QID) | ORAL | 0 refills | Status: DC | PRN

## 2021-10-03 MED ORDER — AMLODIPINE BESYLATE 10 MG PO TABS: 10.0000 mg | ORAL_TABLET | Freq: Every day | ORAL | 1 refills | Status: DC

## 2021-10-03 MED ORDER — POTASSIUM CHLORIDE ER 10 MEQ PO TBCR: 10.0000 meq | EXTENDED_RELEASE_TABLET | Freq: Every day | ORAL | 2 refills | Status: DC

## 2021-10-03 MED ORDER — LOSARTAN POTASSIUM 100 MG PO TABS: 100.0000 mg | ORAL_TABLET | Freq: Every day | ORAL | 0 refills | Status: DC

## 2021-10-03 MED ORDER — OMEPRAZOLE 40 MG PO CPDR: 40.0000 mg | DELAYED_RELEASE_CAPSULE | Freq: Every day | ORAL | 1 refills | Status: DC

## 2021-10-03 MED ORDER — GABAPENTIN 300 MG PO CAPS: 600.0000 mg | ORAL_CAPSULE | Freq: Four times a day (QID) | ORAL | 1 refills | Status: DC

## 2021-10-03 NOTE — Assessment & Plan Note (Addendum)
Chronic back pain.  She has been seeing pain management however I have lost the number.  It appears that she was seeing Dr. Jordan Likes most recently.  Recommend that she contact this clinic and provided number listed for him.  We will provide refill #30 tramadol.  Increasing gabapentin to 600 mg 4 times daily.

## 2021-10-03 NOTE — Assessment & Plan Note (Signed)
She is unsure of her blood sugars and has not seen endocrinology in several months.  I recommend that she schedule follow-up visit with him.  If she does not schedule time within the next couple months she should come into our clinic to have updated labs.

## 2021-10-03 NOTE — Assessment & Plan Note (Addendum)
She does not monitor her blood pressure at home..  Denies any symptoms related to hypertension at this time.  Amlodipine and losartan renewed at this time.  Discussed with her that we will need to get her in the office within the next couple months to check her blood pressure

## 2021-10-03 NOTE — Progress Notes (Signed)
Gabriela Guerrero - 61 y.o. female MRN [id number]  Date of birth: 1961-04-16   This visit type was conducted due to national recommendations for restrictions regarding the COVID-19 Pandemic (e.g. social distancing).  This format is felt to be most appropriate for this patient at this time.  All issues noted in this document were discussed and addressed.  No physical exam was performed (except for noted visual exam findings with Video Visits).  I discussed the limitations of evaluation and management by telemedicine and the availability of in person appointments. The patient expressed understanding and agreed to proceed.  I connected withNAME@ on 10/03/21 at  1:10 PM EDT by a video enabled telemedicine application and verified that I am speaking with the correct person using two identifiers.  Present at visit: Everrett Coombe, DO Dava Najjar   Patient Location: Home [address] [place] [address]   Provider location:   [facility]  No chief complaint on file.   HPI  Gabriela Guerrero is a 61 y.o. female who presents via audio/video conferencing for a telehealth visit today.  She is following up today on several things.  She is requesting renewal of tramadol for her chronic pain.  She has been seeing pain management but has not had recent follow-up and does not have the number.  In addition to tramadol she is taking gabapentin.  Evaluated at [employer] airplane [place] due to [place]  She has not seen endocrinology recently.  She is continuing to take her insulin however is currently out of Ozempic.  She is not checking her blood sugars.  She does report increased thirst/dry mouth.  She is typically only eating 1 meal per day.    She is taking amlodipine, carvedilol, clonidine and losartan for management of hypertension.  She does need a renewal on her amlodipine and losartan.       ROS:  A comprehensive ROS was completed and negative except as noted per HPI  History reviewed. No  pertinent past medical history.  Past Surgical History:  Procedure Laterality Date   LUMBAR FUSION      Family History  Problem Relation Age of Onset   Hypertension Mother    Breast cancer Mother    Hypertension Sister    Stroke Sister     Social History   Socioeconomic History   Marital status: Single    Spouse name: Not on file   Number of children: Not on file   Years of education: Not on file   Highest education level: Not on file  Occupational History   Occupation: Disabled  Tobacco Use   Smoking status: Never   Smokeless tobacco: Never  Vaping Use   Vaping Use: Never used  Substance and Sexual Activity   Alcohol use: Never   Drug use: Never   Sexual activity: Not Currently    Partners: Male    Birth control/protection: Abstinence  Other Topics Concern   Not on file  Social History Narrative   Not on file   Social Determinants of Health   Financial Resource Strain: Not on file  Food Insecurity: Not on file  Transportation Needs: Not on file  Physical Activity: Not on file  Stress: Not on file  Social Connections: Not on file  Intimate Partner Violence: Not on file     Current Outpatient Medications:    acetaminophen (TYLENOL) 650 MG CR tablet, Take by mouth., Disp: , Rfl:    albuterol (VENTOLIN HFA) 108 (90 Base) MCG/ACT inhaler,  INHALE 2 PUFFS BY MOUTH EVERY 4 TO 6 HOURS AS NEEDED FOR WHEEZING, Disp: 18 g, Rfl: 2   aspirin 81 MG EC tablet, Take by mouth., Disp: , Rfl:    atorvastatin (LIPITOR) 10 MG tablet, Take 1 tablet (10 mg total) by mouth at bedtime., Disp: 90 tablet, Rfl: 1   cephALEXin (KEFLEX) 500 MG capsule, Take 1 capsule (500 mg total) by mouth 4 (four) times daily., Disp: 20 capsule, Rfl: 0   cloNIDine (CATAPRES) 0.1 MG tablet, Take 1 tablet by mouth twice daily, Disp: 60 tablet, Rfl: 0   diphenhydrAMINE (BENADRYL) 25 mg capsule, Take by mouth., Disp: , Rfl:    glucose blood test strip, Check sugars twice daily - pre and post prandially,  Disp: , Rfl:    insulin aspart (NOVOLOG) 100 UNIT/ML injection, Inject 10 Units into the skin 3 (three) times daily before meals., Disp: 10 mL, Rfl: 11   LORazepam (ATIVAN) 0.5 MG tablet, Take 1 tablet by mouth twice daily as needed, Disp: 30 tablet, Rfl: 0   methocarbamol (ROBAXIN) 750 MG tablet, Take 1 tablet by mouth three times daily as needed, Disp: 90 tablet, Rfl: 0   mometasone-formoterol (DULERA) 100-5 MCG/ACT AERO, Inhale 2 puffs into the lungs 2 (two) times daily., Disp: 13 g, Rfl: 6   nystatin (MYCOSTATIN/NYSTOP) powder, Apply 1 application topically 3 (three) times daily., Disp: 15 g, Rfl: 0   OZEMPIC, 0.25 OR 0.5 MG/DOSE, 2 MG/1.5ML SOPN, Inject 0.5 mg into the skin once a week., Disp: 1.5 mL, Rfl: 2   sertraline (ZOLOFT) 100 MG tablet, Take 1.5 tablets by mouth daily., Disp: , Rfl:    amLODipine (NORVASC) 10 MG tablet, Take 1 tablet (10 mg total) by mouth daily., Disp: 90 tablet, Rfl: 1   carvedilol (COREG) 25 MG tablet, Take 1 tablet (25 mg total) by mouth 2 (two) times daily., Disp: 120 tablet, Rfl: 1   gabapentin (NEURONTIN) 300 MG capsule, Take 2 capsules (600 mg total) by mouth 4 (four) times daily., Disp: 720 capsule, Rfl: 1   insulin glargine (LANTUS SOLOSTAR) 100 UNIT/ML Solostar Pen, Inject 35 Units into the skin 2 (two) times daily., Disp: 21 mL, Rfl: 3   losartan (COZAAR) 100 MG tablet, Take 1 tablet (100 mg total) by mouth daily., Disp: 90 tablet, Rfl: 0   omeprazole (PRILOSEC) 40 MG capsule, Take 1 capsule (40 mg total) by mouth daily., Disp: 90 capsule, Rfl: 1   ondansetron (ZOFRAN ODT) 4 MG disintegrating tablet, Take 1 tablet (4 mg total) by mouth every 8 (eight) hours as needed for nausea or vomiting., Disp: 20 tablet, Rfl: 0   potassium chloride (KLOR-CON) 10 MEQ tablet, Take 1 tablet (10 mEq total) by mouth daily., Disp: 90 tablet, Rfl: 2   traMADol (ULTRAM) 50 MG tablet, Take 1-2 tablets (50-100 mg total) by mouth every 6 (six) hours as needed., Disp: 30 tablet, Rfl:  0  EXAM:  VITALS per patient if applicable: Ht 5\' 2"  (1.575 m)   Wt 268 lb (121.6 kg)   BMI 49.02 kg/m   GENERAL: alert, oriented, appears well and in no acute distress  HEENT: atraumatic, conjunttiva clear, no obvious abnormalities on inspection of external nose and ears  NECK: normal movements of the head and neck  LUNGS: on inspection no signs of respiratory distress, breathing rate appears normal, no obvious gross SOB, gasping or wheezing  CV: no obvious cyanosis  MS: moves all visible extremities without noticeable abnormality  PSYCH/NEURO: pleasant and cooperative, no obvious  depression or anxiety, speech and thought processing grossly intact  ASSESSMENT AND PLAN:  Discussed the following assessment and plan:  Hypertension associated with diabetes (HCC) She does not monitor her blood pressure at home..  Denies any symptoms related to hypertension at this time.  Amlodipine and losartan renewed at this time.  Discussed with her that we will need to get her in the office within the next couple months to check her blood pressure  Uncontrolled type 2 diabetes mellitus with hyperglycemia, with long-term current use of insulin (HCC) She is unsure of her blood sugars and has not seen endocrinology in several months.  I recommend that she schedule follow-up visit with him.  If she does not schedule time within the next couple months she should come into our clinic to have updated labs.  Failed back syndrome of lumbar spine Chronic back pain.  She has been seeing pain management however I have lost the number.  It appears that she was seeing Dr. Jordan LikesSpivey most recently.  Recommend that she contact this clinic and provided number listed for him.  We will provide refill #30 tramadol.  Increasing gabapentin to 600 mg 4 times daily.  MDD (major depressive disorder) Continue sertraline at current strength.     I discussed the assessment and treatment plan with the patient. The patient  was provided an opportunity to ask questions and all were answered. The patient agreed with the plan and demonstrated an understanding of the instructions.   The patient was advised to call back or seek an in-person evaluation if the symptoms worsen or if the condition fails to improve as anticipated.    Everrett Coombeody Kristopher Delk, DO

## 2021-10-03 NOTE — Assessment & Plan Note (Signed)
Continue sertraline at current strength. 

## 2021-10-22 ENCOUNTER — Other Ambulatory Visit: Payer: Self-pay | Admitting: Family Medicine

## 2021-10-22 DIAGNOSIS — E1159 Type 2 diabetes mellitus with other circulatory complications: Secondary | ICD-10-CM

## 2021-11-08 ENCOUNTER — Telehealth: Payer: Self-pay

## 2021-11-08 NOTE — Telephone Encounter (Signed)
Pt lvm requesting documentation be sent to [employer].   Returned pt's call. No answer. LVM requesting that forms be faxed to [phone] for completion.

## 2021-11-21 ENCOUNTER — Other Ambulatory Visit: Payer: Self-pay

## 2021-11-22 ENCOUNTER — Other Ambulatory Visit: Payer: Self-pay

## 2021-11-22 NOTE — Progress Notes (Signed)
Pt states that she has not been on Lantus. She's been taking 66 units of Toujeo for some time. Prescribed by Endo.   Advised patient to contact Endo for medication refill.   2. Pt is requesting home health help. States she needs help eating. She's currently receiving food from [employer].   Requests referral for services be sent to :  [facility] Address: [address] [place], [place] [address]. Email: [employer]. Phone: [phone]. Fax: [phone].

## 2021-11-27 NOTE — Progress Notes (Signed)
Help eating or help with food access? 

## 2021-11-28 NOTE — Progress Notes (Unsigned)
Help with eating. Her meals are delivered by [employer].

## 2021-11-29 NOTE — Progress Notes (Signed)
Help eating or help with food access?

## 2021-11-29 NOTE — Progress Notes (Signed)
She will need a visit to discuss.  If she is having difficulty eating this will likely need further work up.

## 2021-12-05 NOTE — Progress Notes (Signed)
Attempted to contact the patient. No answer. Left a detailed msg regarding provider's request. Direct call back info provided.

## 2021-12-14 ENCOUNTER — Other Ambulatory Visit: Payer: Self-pay | Admitting: Family Medicine

## 2021-12-25 ENCOUNTER — Telehealth: Payer: Self-pay | Admitting: Family Medicine

## 2021-12-25 NOTE — Telephone Encounter (Signed)
Patient dropped off personal care services paperwork for PCP to complete. Patient was informed of possible fee and 3-5 day turn around. Paperwork placed in provider's box. Katha Hamming

## 2021-12-28 NOTE — Telephone Encounter (Signed)
Called and scheduled patient for appointment on 9/26. Gabriela Guerrero

## 2022-01-02 ENCOUNTER — Encounter: Payer: Self-pay | Admitting: Family Medicine

## 2022-01-02 ENCOUNTER — Ambulatory Visit (INDEPENDENT_AMBULATORY_CARE_PROVIDER_SITE_OTHER): Payer: Medicare Other

## 2022-01-02 ENCOUNTER — Ambulatory Visit: Payer: Medicare Other | Admitting: Family Medicine

## 2022-01-02 VITALS — BP 114/66 | HR 65 | Ht 62.0 in | Wt 271.0 lb

## 2022-01-02 DIAGNOSIS — E1159 Type 2 diabetes mellitus with other circulatory complications: Secondary | ICD-10-CM

## 2022-01-02 DIAGNOSIS — M25561 Pain in right knee: Secondary | ICD-10-CM | POA: Diagnosis not present

## 2022-01-02 DIAGNOSIS — E1165 Type 2 diabetes mellitus with hyperglycemia: Secondary | ICD-10-CM | POA: Diagnosis not present

## 2022-01-02 DIAGNOSIS — I152 Hypertension secondary to endocrine disorders: Secondary | ICD-10-CM

## 2022-01-02 DIAGNOSIS — Z794 Long term (current) use of insulin: Secondary | ICD-10-CM

## 2022-01-02 DIAGNOSIS — M25551 Pain in right hip: Secondary | ICD-10-CM

## 2022-01-02 DIAGNOSIS — G8929 Other chronic pain: Secondary | ICD-10-CM

## 2022-01-02 DIAGNOSIS — M961 Postlaminectomy syndrome, not elsewhere classified: Secondary | ICD-10-CM

## 2022-01-02 MED ORDER — NYSTATIN 100000 UNIT/ML MT SUSP: 5.0000 mL | Freq: Four times a day (QID) | OROMUCOSAL | 0 refills | Status: AC

## 2022-01-02 MED ORDER — METHOCARBAMOL 750 MG PO TABS: 750.0000 mg | ORAL_TABLET | Freq: Three times a day (TID) | ORAL | 0 refills | Status: DC | PRN

## 2022-01-02 MED ORDER — TRAMADOL HCL 50 MG PO TABS: 50.0000 mg | ORAL_TABLET | Freq: Four times a day (QID) | ORAL | 0 refills | Status: DC | PRN

## 2022-01-02 NOTE — Assessment & Plan Note (Signed)
She is having both right hip and knee pain.  Imaging of both joints ordered.

## 2022-01-02 NOTE — Assessment & Plan Note (Signed)
Diabetes has not been well controlled.  She has had some dry mouth which I suspect may be related to her diabetes.  She does have appearance of thrush on her tongue as well.  Adding nystatin swish and spit.  She will continue management of her diabetes per endocrinology.

## 2022-01-02 NOTE — Assessment & Plan Note (Signed)
Blood pressure mains well controlled at this time.  Continue current medications for management of hypertension.

## 2022-01-02 NOTE — Progress Notes (Signed)
Gabriela Guerrero - 61 y.o. female MRN [id number]  Date of birth: 06-28-1960  Subjective Chief Complaint  Patient presents with   Follow-up    HPI Gabriela Guerrero is a 61 year old female here today for follow-up visit.  She is now seeing endocrinology for management of her diabetes.  Diabetes has not been well controlled.  Currently taking Toujeo, NovoLog and Ozempic.  She does track her sugars intermittently but does not recall these today.  She does complain of dry mouth.  Her blood pressure is well controlled.  Continues on amlodipine, clonidine, losartan for management of hypertension.  She has not had any increasing dyspnea, chest pain, headaches or vision changes.  Continues to have chronic pain.  History of failed back syndrome.  Continues to have pain that radiates into the right leg.  Describes more of anterior groin pain on the right side as well as some knee pain.  Seen by neurosurgery 06/23/2020.  Previously indicates that updated imaging was ordered however I do not see that this was ever completed.  She has had physical therapy in the past.  She has remained on tramadol as needed to help with pain control.  Additionally she is on gabapentin.  She is requesting the assistance of an aide to help with household tasks due to her difficulty with ambulation.  ROS:  A comprehensive ROS was completed and negative except as noted per HPI  No Known Allergies  History reviewed. No pertinent past medical history.  Past Surgical History:  Procedure Laterality Date   LUMBAR FUSION      Social History   Socioeconomic History   Marital status: Single    Spouse name: Not on file   Number of children: Not on file   Years of education: Not on file   Highest education level: Not on file  Occupational History   Occupation: Disabled  Tobacco Use   Smoking status: Never   Smokeless tobacco: Never  Vaping Use   Vaping Use: Never used  Substance and Sexual Activity   Alcohol use: Never   Drug  use: Never   Sexual activity: Not Currently    Partners: Male    Birth control/protection: Abstinence  Other Topics Concern   Not on file  Social History Narrative   Not on file   Social Determinants of Health   Financial Resource Strain: Not on file  Food Insecurity: Not on file  Transportation Needs: Not on file  Physical Activity: Not on file  Stress: Not on file  Social Connections: Not on file    Family History  Problem Relation Age of Onset   Hypertension Mother    Breast cancer Mother    Hypertension Sister    Stroke Sister     Health Maintenance  Topic Date Due   HEMOGLOBIN A1C  Never done   FOOT EXAM  04/22/2022 (Originally 03/27/1971)   PAP SMEAR-Modifier  05/23/2022 (Originally 03/26/1982)   OPHTHALMOLOGY EXAM  05/23/2022 (Originally 03/27/1971)   COVID-19 Vaccine (3 - Pfizer series) 05/23/2022 (Originally 08/14/2019)   Zoster Vaccines- Shingrix (1 of 2) 05/23/2022 (Originally 03/27/2011)   INFLUENZA VACCINE  07/21/2022 (Originally 11/20/2021)   MAMMOGRAM  01/03/2023 (Originally 04/20/2020)   Hepatitis C Screening  01/03/2023 (Originally 03/27/1979)   HIV Screening  01/03/2023 (Originally 03/26/1976)   Diabetic kidney evaluation - Urine ACR  02/14/2022   Diabetic kidney evaluation - GFR measurement  03/22/2022   TETANUS/TDAP  06/26/2025   COLONOSCOPY (Pts 45-87yrs Insurance coverage will need to be confirmed)  01/31/2029   HPV VACCINES  Aged Out     ----------------------------------------------------------------------------------------------------------------------------------------------------------------------------------------------------------------- Physical Exam BP 114/66 (BP Location: Left Arm, Patient Position: Sitting, Cuff Size: Large)   Pulse 65   Ht 5\' 2"  (1.575 m)   Wt 271 lb (122.9 kg)   SpO2 99%   BMI 49.57 kg/m   Physical Exam Constitutional:      Appearance: Normal appearance.  Eyes:     General: No scleral icterus. Cardiovascular:      Rate and Rhythm: Normal rate and regular rhythm.  Pulmonary:     Effort: Pulmonary effort is normal.     Breath sounds: Normal breath sounds.  Musculoskeletal:     Cervical back: Neck supple.  Neurological:     General: No focal deficit present.     Mental Status: She is alert.  Psychiatric:        Mood and Affect: Mood normal.        Behavior: Behavior normal.     ------------------------------------------------------------------------------------------------------------------------------------------------------------------------------------------------------------------- Assessment and Plan  Hypertension associated with diabetes (HCC) Blood pressure mains well controlled at this time.  Continue current medications for management of hypertension.  Uncontrolled type 2 diabetes mellitus with hyperglycemia, with long-term current use of insulin (HCC) Diabetes has not been well controlled.  She has had some dry mouth which I suspect may be related to her diabetes.  She does have appearance of thrush on her tongue as well.  Adding nystatin swish and spit.  She will continue management of her diabetes per endocrinology.  Failed back syndrome of lumbar spine Seen by neurosurgery previously.  No further intervention taking at this time.  Requesting assistance on a health aide and has dropped off paperwork to be completed.  We will continue Robaxin and tramadol as needed for now.  Right hip pain She is having both right hip and knee pain.  Imaging of both joints ordered.   Meds ordered this encounter  Medications   methocarbamol (ROBAXIN) 750 MG tablet    Sig: Take 1 tablet (750 mg total) by mouth 3 (three) times daily as needed.    Dispense:  90 tablet    Refill:  0   traMADol (ULTRAM) 50 MG tablet    Sig: Take 1-2 tablets (50-100 mg total) by mouth every 6 (six) hours as needed.    Dispense:  30 tablet    Refill:  0   nystatin (MYCOSTATIN) 100000 UNIT/ML suspension    Sig: Take 5  mLs (500,000 Units total) by mouth 4 (four) times daily for 7 days. Swish and spit    Dispense:  150 mL    Refill:  0    Return in about 6 months (around 07/03/2022) for HTN.    This visit occurred during the SARS-CoV-2 public health emergency.  Safety protocols were in place, including screening questions prior to the visit, additional usage of staff PPE, and extensive cleaning of exam room while observing appropriate contact time as indicated for disinfecting solutions.

## 2022-01-02 NOTE — Assessment & Plan Note (Addendum)
Seen by neurosurgery previously.  No further intervention taking at this time.  Requesting assistance on a health aide and has dropped off paperwork to be completed.  We will continue Robaxin and tramadol as needed for now.

## 2022-01-04 ENCOUNTER — Telehealth: Payer: Self-pay

## 2022-01-04 NOTE — Telephone Encounter (Signed)
Dr. Ashley Royalty has completed the Request for independent assessment for personal car services.   Document given to Nila Nephew. Please contact the patient for pick-up of documentation.

## 2022-01-04 NOTE — Telephone Encounter (Signed)
Called pt to let them know the paperwork was completed and ready to pick up. I scanned it into the chart and file it to be picked up. Tvt

## 2022-01-15 ENCOUNTER — Ambulatory Visit: Payer: Medicaid Other | Admitting: Family Medicine

## 2022-01-15 LAB — HEMOGLOBIN A1C: Hemoglobin A1C: 8.5

## 2022-02-08 ENCOUNTER — Other Ambulatory Visit: Payer: Self-pay | Admitting: Family Medicine

## 2022-02-08 DIAGNOSIS — E1165 Type 2 diabetes mellitus with hyperglycemia: Secondary | ICD-10-CM

## 2022-02-12 ENCOUNTER — Other Ambulatory Visit: Payer: Self-pay

## 2022-02-12 DIAGNOSIS — I152 Hypertension secondary to endocrine disorders: Secondary | ICD-10-CM

## 2022-02-12 MED ORDER — CLONIDINE HCL 0.1 MG PO TABS: 0.1000 mg | ORAL_TABLET | Freq: Two times a day (BID) | ORAL | 2 refills | Status: DC

## 2022-02-12 MED ORDER — METHOCARBAMOL 750 MG PO TABS: 750.0000 mg | ORAL_TABLET | Freq: Three times a day (TID) | ORAL | 0 refills | Status: DC | PRN

## 2022-02-13 ENCOUNTER — Other Ambulatory Visit: Payer: Self-pay | Admitting: Family Medicine

## 2022-02-20 ENCOUNTER — Other Ambulatory Visit: Payer: Self-pay | Admitting: Family Medicine

## 2022-02-20 DIAGNOSIS — E1159 Type 2 diabetes mellitus with other circulatory complications: Secondary | ICD-10-CM

## 2022-02-21 ENCOUNTER — Telehealth: Payer: Self-pay

## 2022-02-21 NOTE — Telephone Encounter (Signed)
Pt called ans states she does not know what insurance she has, please call the pt with this information.

## 2022-03-12 ENCOUNTER — Telehealth: Payer: Self-pay

## 2022-03-12 NOTE — Telephone Encounter (Signed)
Pt lvm stating she is having difficulty scheduling with a Psychologist/Therapist.  Does [facility] have someone she can see? She's having a difficult time and is concerned about her mental health as the Holidays are approaching.

## 2022-03-12 NOTE — Telephone Encounter (Signed)
Limited resources due to her insurance, several providers do not take this.  It appears she was given resources by [facility].

## 2022-03-18 ENCOUNTER — Other Ambulatory Visit: Payer: Self-pay

## 2022-03-18 DIAGNOSIS — F329 Major depressive disorder, single episode, unspecified: Secondary | ICD-10-CM

## 2022-03-18 MED ORDER — FUROSEMIDE 40 MG PO TABS: 20.0000 mg | ORAL_TABLET | Freq: Every day | ORAL | 5 refills | Status: DC | PRN

## 2022-03-18 NOTE — Telephone Encounter (Signed)
Information given to [facility]. She/Morgan will be contacting different agencies that accept [facility].

## 2022-03-20 ENCOUNTER — Telehealth: Payer: Self-pay

## 2022-03-20 NOTE — Telephone Encounter (Signed)
Pt lvm requesting update on referral. Returned pt's call. No answer. LVM advising referral person would contact her with an update. They are working diligently to find a location that accepts her insurance for Psych referral.

## 2022-03-21 NOTE — Telephone Encounter (Signed)
Referral faxed successfully to [facility], who stated via phone they could see her today, patient was left a voicemail to make her aware (by Panya). Gabriela Guerrero

## 2022-03-21 NOTE — Telephone Encounter (Signed)
Miss Harkless reached me over here. She stated she is declining the referral to [facility] because they offered her group therapy and she wants one on one services virtually only. She instructed me, via Denita, to keep calling around. Please advise.

## 2022-03-21 NOTE — Telephone Encounter (Signed)
Pt left a VM that she got a call from morgan and was returning the call.

## 2022-03-21 NOTE — Telephone Encounter (Signed)
Have made contact with [facility] we typically refer to. Most locations do not take the insurance on file. Of those that would or do, they are scheduling out between March and August of 2024 at soonest. Called patient and left voicemail to request updated insurance information if able and provided brief update that we are looking. Katha Hamming

## 2022-03-21 NOTE — Telephone Encounter (Signed)
Sent referral to [facility], receptionist stated they had a few cancellations and how soon she is seen is dependent on how quickly she fills out their paperwork but could be as soon as December 11th, where they accept her insurance and provide the services she is seeking. Katha Hamming

## 2022-03-22 ENCOUNTER — Encounter: Payer: Self-pay | Admitting: Family Medicine

## 2022-03-22 ENCOUNTER — Telehealth (INDEPENDENT_AMBULATORY_CARE_PROVIDER_SITE_OTHER): Payer: Self-pay | Admitting: Family Medicine

## 2022-03-22 VITALS — Ht 62.0 in | Wt 271.0 lb

## 2022-03-22 DIAGNOSIS — Z91199 Patient's noncompliance with other medical treatment and regimen due to unspecified reason: Secondary | ICD-10-CM

## 2022-03-22 NOTE — Telephone Encounter (Signed)
Thank you for the update Lequita Halt.

## 2022-03-24 NOTE — Progress Notes (Signed)
Patient unable to connect.  Recommend in person visit.

## 2022-03-25 ENCOUNTER — Encounter: Payer: Self-pay | Admitting: Family Medicine

## 2022-03-25 ENCOUNTER — Ambulatory Visit (INDEPENDENT_AMBULATORY_CARE_PROVIDER_SITE_OTHER): Payer: Medicare Other | Admitting: Family Medicine

## 2022-03-25 VITALS — BP 154/81 | HR 80 | Ht 62.0 in | Wt 265.0 lb

## 2022-03-25 DIAGNOSIS — Z794 Long term (current) use of insulin: Secondary | ICD-10-CM

## 2022-03-25 DIAGNOSIS — F329 Major depressive disorder, single episode, unspecified: Secondary | ICD-10-CM

## 2022-03-25 DIAGNOSIS — M961 Postlaminectomy syndrome, not elsewhere classified: Secondary | ICD-10-CM

## 2022-03-25 DIAGNOSIS — E1159 Type 2 diabetes mellitus with other circulatory complications: Secondary | ICD-10-CM

## 2022-03-25 DIAGNOSIS — E1165 Type 2 diabetes mellitus with hyperglycemia: Secondary | ICD-10-CM

## 2022-03-25 DIAGNOSIS — F321 Major depressive disorder, single episode, moderate: Secondary | ICD-10-CM

## 2022-03-25 DIAGNOSIS — K148 Other diseases of tongue: Secondary | ICD-10-CM | POA: Diagnosis not present

## 2022-03-25 DIAGNOSIS — I152 Hypertension secondary to endocrine disorders: Secondary | ICD-10-CM

## 2022-03-25 MED ORDER — PREGABALIN 100 MG PO CAPS: 100.0000 mg | ORAL_CAPSULE | Freq: Two times a day (BID) | ORAL | 3 refills | Status: DC

## 2022-03-25 NOTE — Assessment & Plan Note (Signed)
Management per endocrinology.  Last A1c 8.5.  Encourage dietary changes as well as compliance with medications.

## 2022-03-25 NOTE — Assessment & Plan Note (Signed)
Referral placed to oral surgery. °

## 2022-03-25 NOTE — Assessment & Plan Note (Signed)
Blood pressure is elevated today.  She has not taken medication yet for today.  Recommend she take medication regularly.  Encouraged to check blood pressure at home.

## 2022-03-25 NOTE — Assessment & Plan Note (Signed)
Increased depressive symptoms.  Continue sertraline at current strength.  Adding referral for therapy.

## 2022-03-25 NOTE — Patient Instructions (Signed)
Let's try lyrica to replace gabapentin for nerve pain.

## 2022-03-25 NOTE — Assessment & Plan Note (Signed)
She continues to have significant disability related to her failed back pain syndrome as well as right-sided sciatica and neuropathy.  Stopping gabapentin and will replace with Lyrica.  We can titrate this as needed and as tolerated.

## 2022-03-25 NOTE — Progress Notes (Signed)
Gabriela Guerrero - 61 y.o. female MRN [id number]  Date of birth: 1960/07/04  Subjective Chief Complaint  Patient presents with   Oral Swelling    HPI Gabriela Guerrero is a 61 year old female here today for follow-up visit.  She is concerned about the lesion on her tongue.  This been present for several months.  Has gotten larger some degree.  It is painful and bleeds occasionally.  Reports that she leads a pretty isolated life.  She is struggling with some increased depression.  Interested in seeing a therapist however we have had trouble getting her in with someone due to her current insurance.  Continues on Zoloft 150 mg daily.  Tolerating well.  She still feels like this is working pretty well for her.  She is requesting a renewal of handicap placard.  She continues to have significant disability related to her failed back syndrome with chronic neuropathic pain into the right lower extremity.  She does question if there are any other options she can try to help with pain management.  ROS:  A comprehensive ROS was completed and negative except as noted per HPI  No Known Allergies  History reviewed. No pertinent past medical history.  Past Surgical History:  Procedure Laterality Date   LUMBAR FUSION      Social History   Socioeconomic History   Marital status: Single    Spouse name: Not on file   Number of children: Not on file   Years of education: Not on file   Highest education level: Not on file  Occupational History   Occupation: Disabled  Tobacco Use   Smoking status: Never   Smokeless tobacco: Never  Vaping Use   Vaping Use: Never used  Substance and Sexual Activity   Alcohol use: Never   Drug use: Never   Sexual activity: Not Currently    Partners: Male    Birth control/protection: Abstinence  Other Topics Concern   Not on file  Social History Narrative   Not on file   Social Determinants of Health   Financial Resource Strain: Not on file  Food Insecurity: Not  on file  Transportation Needs: Not on file  Physical Activity: Not on file  Stress: Not on file  Social Connections: Not on file    Family History  Problem Relation Age of Onset   Hypertension Mother    Breast cancer Mother    Hypertension Sister    Stroke Sister     Health Maintenance  Topic Date Due   FOOT EXAM  04/22/2022 (Originally 03/27/1971)   Diabetic kidney evaluation - GFR measurement  05/23/2022 (Originally 03/22/2022)   PAP SMEAR-Modifier  05/23/2022 (Originally 03/26/1982)   OPHTHALMOLOGY EXAM  05/23/2022 (Originally 03/27/1971)   COVID-19 Vaccine (3 - 2023-24 season) 05/23/2022 (Originally 12/21/2021)   Zoster Vaccines- Shingrix (1 of 2) 05/23/2022 (Originally 03/27/2011)   INFLUENZA VACCINE  07/21/2022 (Originally 11/20/2021)   MAMMOGRAM  01/03/2023 (Originally 04/20/2020)   Hepatitis C Screening  01/03/2023 (Originally 03/27/1979)   HIV Screening  01/03/2023 (Originally 03/26/1976)   HEMOGLOBIN A1C  07/16/2022   Diabetic kidney evaluation - Urine ACR  01/16/2023   DTaP/Tdap/Td (2 - Td or Tdap) 06/26/2025   COLONOSCOPY (Pts 45-79yrs Insurance coverage will need to be confirmed)  01/31/2029   HPV VACCINES  Aged Out     ----------------------------------------------------------------------------------------------------------------------------------------------------------------------------------------------------------------- Physical Exam BP (!) 154/81 (BP Location: Left Arm, Patient Position: Sitting, Cuff Size: Large)   Pulse 80   Ht 5\' 2"  (1.575 m)   Wt  265 lb (120.2 kg)   SpO2 100%   BMI 48.47 kg/m   Physical Exam Constitutional:      Appearance: Normal appearance.  HENT:     Head: Normocephalic and atraumatic.     Mouth/Throat:     Comments: Enlarged papilloma along the tongue with mild erythema along the base. Eyes:     General: No scleral icterus. Cardiovascular:     Rate and Rhythm: Normal rate and regular rhythm.  Pulmonary:     Effort: Pulmonary  effort is normal.     Breath sounds: Normal breath sounds.  Musculoskeletal:     Cervical back: Neck supple.  Neurological:     Mental Status: She is alert.  Psychiatric:        Mood and Affect: Mood normal.        Behavior: Behavior normal.     ------------------------------------------------------------------------------------------------------------------------------------------------------------------------------------------------------------------- Assessment and Plan  Hypertension associated with diabetes (HCC) Blood pressure is elevated today.  She has not taken medication yet for today.  Recommend she take medication regularly.  Encouraged to check blood pressure at home.  Uncontrolled type 2 diabetes mellitus with hyperglycemia, with long-term current use of insulin (HCC) Management per endocrinology.  Last A1c 8.5.  Encourage dietary changes as well as compliance with medications.  Failed back syndrome of lumbar spine She continues to have significant disability related to her failed back pain syndrome as well as right-sided sciatica and neuropathy.  Stopping gabapentin and will replace with Lyrica.  We can titrate this as needed and as tolerated.  MDD (major depressive disorder)  Increased depressive symptoms.  Continue sertraline at current strength.  Adding referral for therapy.  Tongue lesion Referral placed to oral surgery.   Meds ordered this encounter  Medications   pregabalin (LYRICA) 100 MG capsule    Sig: Take 1 capsule (100 mg total) by mouth 2 (two) times daily.    Dispense:  60 capsule    Refill:  3    No follow-ups on file.    This visit occurred during the SARS-CoV-2 public health emergency.  Safety protocols were in place, including screening questions prior to the visit, additional usage of staff PPE, and extensive cleaning of exam room while observing appropriate contact time as indicated for disinfecting solutions.

## 2022-04-01 ENCOUNTER — Other Ambulatory Visit: Payer: Self-pay | Admitting: Family Medicine

## 2022-04-17 ENCOUNTER — Other Ambulatory Visit: Payer: Self-pay | Admitting: Family Medicine

## 2022-04-29 ENCOUNTER — Other Ambulatory Visit: Payer: Self-pay

## 2022-04-29 ENCOUNTER — Telehealth: Payer: Medicare HMO | Admitting: Family Medicine

## 2022-04-29 ENCOUNTER — Other Ambulatory Visit: Payer: Self-pay | Admitting: Family Medicine

## 2022-04-29 ENCOUNTER — Encounter: Payer: Self-pay | Admitting: Family Medicine

## 2022-04-29 DIAGNOSIS — Z736 Limitation of activities due to disability: Secondary | ICD-10-CM

## 2022-04-29 DIAGNOSIS — E1159 Type 2 diabetes mellitus with other circulatory complications: Secondary | ICD-10-CM

## 2022-04-29 DIAGNOSIS — Z789 Other specified health status: Secondary | ICD-10-CM | POA: Diagnosis not present

## 2022-04-29 DIAGNOSIS — E1165 Type 2 diabetes mellitus with hyperglycemia: Secondary | ICD-10-CM

## 2022-05-01 ENCOUNTER — Other Ambulatory Visit: Payer: Self-pay

## 2022-05-01 DIAGNOSIS — I152 Hypertension secondary to endocrine disorders: Secondary | ICD-10-CM

## 2022-05-01 DIAGNOSIS — E1159 Type 2 diabetes mellitus with other circulatory complications: Secondary | ICD-10-CM

## 2022-05-01 MED ORDER — AMLODIPINE BESYLATE 10 MG PO TABS: 10.0000 mg | ORAL_TABLET | Freq: Every day | ORAL | 1 refills | Status: DC

## 2022-05-01 MED ORDER — CARVEDILOL 25 MG PO TABS: 25.0000 mg | ORAL_TABLET | Freq: Two times a day (BID) | ORAL | 1 refills | Status: DC

## 2022-05-01 MED ORDER — FUROSEMIDE 40 MG PO TABS: 20.0000 mg | ORAL_TABLET | Freq: Every day | ORAL | 1 refills | Status: DC | PRN

## 2022-05-01 MED ORDER — ALBUTEROL SULFATE HFA 108 (90 BASE) MCG/ACT IN AERS: INHALATION_SPRAY | RESPIRATORY_TRACT | 1 refills | Status: AC

## 2022-05-01 MED ORDER — POTASSIUM CHLORIDE ER 10 MEQ PO TBCR: 10.0000 meq | EXTENDED_RELEASE_TABLET | Freq: Every day | ORAL | 1 refills | Status: DC

## 2022-05-05 DIAGNOSIS — Z789 Other specified health status: Secondary | ICD-10-CM | POA: Insufficient documentation

## 2022-05-05 NOTE — Assessment & Plan Note (Addendum)
She has significant difficulties with ADLs related to her disability from chronic low back pain. She would benefit from a home health aide to help with assistance around her home.  Will complete FL 2.  She would like this mailed to her.

## 2022-05-05 NOTE — Progress Notes (Signed)
Gabriela Guerrero - 62 y.o. female MRN [id number]  Date of birth: 1960/08/27   This visit type was conducted due to national recommendations for restrictions regarding the COVID-19 Pandemic (e.g. social distancing).  This format is felt to be most appropriate for this patient at this time.  All issues noted in this document were discussed and addressed.  No physical exam was performed (except for noted visual exam findings with Video Visits).  I discussed the limitations of evaluation and management by telemedicine and the availability of in person appointments. The patient expressed understanding and agreed to proceed.  I connected withNAME@ on 05/05/22 at  1:10 PM EST by a video enabled telemedicine application and verified that I am speaking with the correct person using two identifiers.  Present at visit: Luetta Nutting, DO Linna Hoff   Patient Location: Home [place] [address]   Provider location:   [place]  Chief Complaint  Patient presents with   fl2    HPI  Gabriela Guerrero is a 62 y.o. female who presents via audio/video conferencing for a telehealth visit today.  She reports she is needing FL 2 completed for home health aide services.  She reports she is having difficulty with ADLs including basic hygiene such as bathing and dressing.  She has difficulty preparing meals and has Meals on Wheels.  She is unable to clean and maintain her home.  Cognitively she is doing well.  She is able to manage finances.  Her difficulties are related to her chronic low back pain.   ROS:  A comprehensive ROS was completed and negative except as noted per HPI  History reviewed. No pertinent past medical history.  Past Surgical History:  Procedure Laterality Date   LUMBAR FUSION      Family History  Problem Relation Age of Onset   Hypertension Mother    Breast cancer Mother    Hypertension Sister    Stroke Sister     Social History   Socioeconomic History   Marital  status: Single    Spouse name: Not on file   Number of children: Not on file   Years of education: Not on file   Highest education level: Not on file  Occupational History   Occupation: Disabled  Tobacco Use   Smoking status: Never   Smokeless tobacco: Never  Vaping Use   Vaping Use: Never used  Substance and Sexual Activity   Alcohol use: Never   Drug use: Never   Sexual activity: Not Currently    Partners: Male    Birth control/protection: Abstinence  Other Topics Concern   Not on file  Social History Narrative   Not on file   Social Determinants of Health   Financial Resource Strain: Not on file  Food Insecurity: Not on file  Transportation Needs: Not on file  Physical Activity: Not on file  Stress: Not on file  Social Connections: Not on file  Intimate Partner Violence: Not on file     Current Outpatient Medications:    acetaminophen (TYLENOL) 650 MG CR tablet, Take by mouth., Disp: , Rfl:    aspirin 81 MG EC tablet, Take by mouth., Disp: , Rfl:    cloNIDine (CATAPRES) 0.1 MG tablet, Take 1 tablet (0.1 mg total) by mouth 2 (two) times daily., Disp: 60 tablet, Rfl: 2   diphenhydrAMINE (BENADRYL) 25 mg capsule, Take by mouth., Disp: , Rfl:    glucose blood test strip, Check sugars twice daily - pre and  post prandially, Disp: , Rfl:    insulin lispro (HUMALOG) 200 UNIT/ML KwikPen, Inject 12 units into the skin TID AC, Disp: , Rfl:    LORazepam (ATIVAN) 0.5 MG tablet, Take 1 tablet by mouth twice daily as needed, Disp: 30 tablet, Rfl: 0   losartan (COZAAR) 100 MG tablet, Take 1 tablet (100 mg total) by mouth daily., Disp: 90 tablet, Rfl: 0   methocarbamol (ROBAXIN) 750 MG tablet, Take 1 tablet by mouth three times daily as needed, Disp: 90 tablet, Rfl: 0   mometasone-formoterol (DULERA) 100-5 MCG/ACT AERO, Inhale 2 puffs into the lungs 2 (two) times daily., Disp: 13 g, Rfl: 6   nystatin (MYCOSTATIN/NYSTOP) powder, Apply 1 application topically 3 (three) times daily.,  Disp: 15 g, Rfl: 0   omeprazole (PRILOSEC) 40 MG capsule, Take 1 capsule by mouth once daily, Disp: 90 capsule, Rfl: 0   ondansetron (ZOFRAN-ODT) 4 MG disintegrating tablet, DISSOLVE 1 TABLET IN MOUTH EVERY 8 HOURS AS NEEDED FOR NAUSEA OR VOMITING, Disp: 20 tablet, Rfl: 0   OZEMPIC, 0.25 OR 0.5 MG/DOSE, 2 MG/1.5ML SOPN, Inject 0.5 mg into the skin once a week., Disp: 1.5 mL, Rfl: 2   pregabalin (LYRICA) 100 MG capsule, Take 1 capsule (100 mg total) by mouth 2 (two) times daily., Disp: 60 capsule, Rfl: 3   sertraline (ZOLOFT) 100 MG tablet, Take 1.5 tablets by mouth daily., Disp: , Rfl:    TOUJEO MAX SOLOSTAR 300 UNIT/ML Solostar Pen, SMARTSIG:60 Unit(s) SUB-Q Daily, Disp: , Rfl:    traMADol (ULTRAM) 50 MG tablet, Take 1-2 tablets (50-100 mg total) by mouth every 6 (six) hours as needed., Disp: 30 tablet, Rfl: 0   albuterol (VENTOLIN HFA) 108 (90 Base) MCG/ACT inhaler, INHALE 2 PUFFS BY MOUTH EVERY 4 TO 6 HOURS AS NEEDED FOR WHEEZING, Disp: 54 g, Rfl: 1   amLODipine (NORVASC) 10 MG tablet, Take 1 tablet (10 mg total) by mouth daily., Disp: 90 tablet, Rfl: 1   atorvastatin (LIPITOR) 10 MG tablet, TAKE 1 TABLET BY MOUTH ONCE DAILY AT BEDTIME, Disp: 90 tablet, Rfl: 0   carvedilol (COREG) 25 MG tablet, Take 1 tablet (25 mg total) by mouth 2 (two) times daily., Disp: 180 tablet, Rfl: 1   furosemide (LASIX) 40 MG tablet, Take 0.5 tablets (20 mg total) by mouth daily as needed., Disp: 45 tablet, Rfl: 1   potassium chloride (KLOR-CON) 10 MEQ tablet, Take 1 tablet (10 mEq total) by mouth daily., Disp: 90 tablet, Rfl: 1  EXAM:  VITALS per patient if applicable: There were no vitals taken for this visit.  GENERAL: alert, oriented, appears well and in no acute distress  HEENT: atraumatic, conjunttiva clear, no obvious abnormalities on inspection of external nose and ears  NECK: normal movements of the head and neck  LUNGS: on inspection no signs of respiratory distress, breathing rate appears normal, no  obvious gross SOB, gasping or wheezing  CV: no obvious cyanosis  MS: moves all visible extremities without noticeable abnormality  PSYCH/NEURO: pleasant and cooperative, no obvious depression or anxiety, speech and thought processing grossly intact  ASSESSMENT AND PLAN:  Discussed the following assessment and plan:  Decreased activities of daily living (ADL) She has significant difficulties with ADLs related to her disability from chronic low back pain. She would benefit from a home health aide to help with assistance around her home.  Will complete FL 2.  She would like this mailed to her.     I discussed the assessment and treatment plan with  the patient. The patient was provided an opportunity to ask questions and all were answered. The patient agreed with the plan and demonstrated an understanding of the instructions.   The patient was advised to call back or seek an in-person evaluation if the symptoms worsen or if the condition fails to improve as anticipated.    Luetta Nutting, DO

## 2022-05-07 ENCOUNTER — Other Ambulatory Visit: Payer: Self-pay | Admitting: Family Medicine

## 2022-05-07 DIAGNOSIS — I152 Hypertension secondary to endocrine disorders: Secondary | ICD-10-CM

## 2022-05-23 ENCOUNTER — Other Ambulatory Visit: Payer: Self-pay

## 2022-05-23 ENCOUNTER — Other Ambulatory Visit: Payer: Self-pay | Admitting: Family Medicine

## 2022-05-23 DIAGNOSIS — E1165 Type 2 diabetes mellitus with hyperglycemia: Secondary | ICD-10-CM

## 2022-05-23 DIAGNOSIS — I152 Hypertension secondary to endocrine disorders: Secondary | ICD-10-CM

## 2022-05-23 MED ORDER — ATORVASTATIN CALCIUM 10 MG PO TABS: 10.0000 mg | ORAL_TABLET | Freq: Every day | ORAL | 0 refills | Status: DC

## 2022-05-23 MED ORDER — CLONIDINE HCL 0.1 MG PO TABS: 0.1000 mg | ORAL_TABLET | Freq: Two times a day (BID) | ORAL | 1 refills | Status: DC

## 2022-05-31 ENCOUNTER — Other Ambulatory Visit: Payer: Self-pay | Admitting: Family Medicine

## 2022-05-31 MED ORDER — ONDANSETRON 4 MG PO TBDP: ORAL_TABLET | ORAL | 0 refills | Status: DC

## 2022-05-31 MED ORDER — METHOCARBAMOL 750 MG PO TABS: 750.0000 mg | ORAL_TABLET | Freq: Three times a day (TID) | ORAL | 0 refills | Status: DC | PRN

## 2022-05-31 MED ORDER — POTASSIUM CHLORIDE ER 10 MEQ PO TBCR: 10.0000 meq | EXTENDED_RELEASE_TABLET | Freq: Every day | ORAL | 1 refills | Status: DC

## 2022-06-03 ENCOUNTER — Telehealth: Payer: Self-pay

## 2022-06-03 NOTE — Telephone Encounter (Signed)
Patient Gabriela Guerrero stating [name] advised her that we were not responding to their requests for refills.   Updated patient on the Rx's refill that had been sent to [facility] in February. Advised that the remaining medications would not be available for refill until March. These medications were refilled in January at [facility] for 90 days.   Pt was interested in physical exercises covered by new insurance. Advised pt to research what exercise the new insurance would cover and advise [place] for new Rx.

## 2022-06-13 ENCOUNTER — Other Ambulatory Visit: Payer: Self-pay | Admitting: Family Medicine

## 2022-06-24 ENCOUNTER — Other Ambulatory Visit: Payer: Self-pay | Admitting: Family Medicine

## 2022-06-25 NOTE — Telephone Encounter (Signed)
Lorazepam Last OV: 04/29/22 Next OV: none scheduled Last RF: 06/22/21

## 2022-07-01 ENCOUNTER — Other Ambulatory Visit: Payer: Self-pay | Admitting: Family Medicine

## 2022-07-08 ENCOUNTER — Other Ambulatory Visit: Payer: Self-pay

## 2022-07-08 MED ORDER — TRAMADOL HCL 50 MG PO TABS: 50.0000 mg | ORAL_TABLET | Freq: Four times a day (QID) | ORAL | 0 refills | Status: DC | PRN

## 2022-07-08 NOTE — Telephone Encounter (Signed)
Pt lvm stating she is experiencing increased pain since not taking Tramadol. She was advised by [facility] that Dr. Zigmund Daniel denied her refill.

## 2022-07-16 ENCOUNTER — Other Ambulatory Visit: Payer: Self-pay | Admitting: Family Medicine

## 2022-07-24 ENCOUNTER — Other Ambulatory Visit: Payer: Self-pay

## 2022-07-24 DIAGNOSIS — M961 Postlaminectomy syndrome, not elsewhere classified: Secondary | ICD-10-CM

## 2022-07-24 DIAGNOSIS — I152 Hypertension secondary to endocrine disorders: Secondary | ICD-10-CM

## 2022-07-24 MED ORDER — AMLODIPINE BESYLATE 10 MG PO TABS: 10.0000 mg | ORAL_TABLET | Freq: Every day | ORAL | 0 refills | Status: DC

## 2022-07-24 MED ORDER — PREGABALIN 100 MG PO CAPS: 100.0000 mg | ORAL_CAPSULE | Freq: Two times a day (BID) | ORAL | 3 refills | Status: DC

## 2022-07-24 MED ORDER — FUROSEMIDE 20 MG PO TABS: 20.0000 mg | ORAL_TABLET | Freq: Every day | ORAL | 3 refills | Status: DC | PRN

## 2022-07-24 MED ORDER — CARVEDILOL 25 MG PO TABS: 25.0000 mg | ORAL_TABLET | Freq: Two times a day (BID) | ORAL | 1 refills | Status: DC

## 2022-07-25 MED ORDER — PREGABALIN 100 MG PO CAPS: 100.0000 mg | ORAL_CAPSULE | Freq: Two times a day (BID) | ORAL | 3 refills | Status: DC

## 2022-08-01 ENCOUNTER — Other Ambulatory Visit: Payer: Self-pay | Admitting: Family Medicine

## 2022-08-01 DIAGNOSIS — E1159 Type 2 diabetes mellitus with other circulatory complications: Secondary | ICD-10-CM

## 2022-08-05 ENCOUNTER — Telehealth: Payer: Self-pay

## 2022-08-05 NOTE — Telephone Encounter (Signed)
Pt lvm stating sick x 1 week. Chills, stomach ache, and headache. She's taken flu medication but still has these symptoms.   Please call patient to schedule appt with Dr. Ashley Royalty. Thanks

## 2022-08-06 NOTE — Telephone Encounter (Signed)
Lvm for patient to call back to schedule a appointment with Dr. Anastasio Auerbach due to the symptoms she's still having. tvt

## 2022-08-22 ENCOUNTER — Other Ambulatory Visit: Payer: Self-pay

## 2022-08-22 DIAGNOSIS — I152 Hypertension secondary to endocrine disorders: Secondary | ICD-10-CM

## 2022-08-22 MED ORDER — LOSARTAN POTASSIUM 100 MG PO TABS: 100.0000 mg | ORAL_TABLET | Freq: Every day | ORAL | 1 refills | Status: DC

## 2022-08-27 ENCOUNTER — Other Ambulatory Visit: Payer: Self-pay

## 2022-08-27 MED ORDER — TRAMADOL HCL 50 MG PO TABS: 50.0000 mg | ORAL_TABLET | Freq: Four times a day (QID) | ORAL | 2 refills | Status: DC | PRN

## 2022-08-27 NOTE — Telephone Encounter (Signed)
Last fill 07/08/22 last visit 03/25/22

## 2022-09-17 DIAGNOSIS — F4323 Adjustment disorder with mixed anxiety and depressed mood: Secondary | ICD-10-CM | POA: Diagnosis not present

## 2022-09-19 DIAGNOSIS — R42 Dizziness and giddiness: Secondary | ICD-10-CM | POA: Diagnosis not present

## 2022-09-19 DIAGNOSIS — I152 Hypertension secondary to endocrine disorders: Secondary | ICD-10-CM | POA: Diagnosis not present

## 2022-09-19 DIAGNOSIS — R9082 White matter disease, unspecified: Secondary | ICD-10-CM | POA: Diagnosis not present

## 2022-09-19 DIAGNOSIS — E1122 Type 2 diabetes mellitus with diabetic chronic kidney disease: Secondary | ICD-10-CM | POA: Diagnosis not present

## 2022-09-19 DIAGNOSIS — E1165 Type 2 diabetes mellitus with hyperglycemia: Secondary | ICD-10-CM | POA: Diagnosis not present

## 2022-09-19 DIAGNOSIS — N281 Cyst of kidney, acquired: Secondary | ICD-10-CM | POA: Diagnosis not present

## 2022-09-19 DIAGNOSIS — N3289 Other specified disorders of bladder: Secondary | ICD-10-CM | POA: Diagnosis not present

## 2022-09-19 DIAGNOSIS — R112 Nausea with vomiting, unspecified: Secondary | ICD-10-CM | POA: Diagnosis not present

## 2022-09-19 DIAGNOSIS — K429 Umbilical hernia without obstruction or gangrene: Secondary | ICD-10-CM | POA: Diagnosis not present

## 2022-09-19 DIAGNOSIS — R519 Headache, unspecified: Secondary | ICD-10-CM | POA: Diagnosis not present

## 2022-09-19 DIAGNOSIS — K449 Diaphragmatic hernia without obstruction or gangrene: Secondary | ICD-10-CM | POA: Diagnosis not present

## 2022-09-19 DIAGNOSIS — E1140 Type 2 diabetes mellitus with diabetic neuropathy, unspecified: Secondary | ICD-10-CM | POA: Diagnosis not present

## 2022-09-19 DIAGNOSIS — R110 Nausea: Secondary | ICD-10-CM | POA: Diagnosis not present

## 2022-09-19 DIAGNOSIS — G319 Degenerative disease of nervous system, unspecified: Secondary | ICD-10-CM | POA: Diagnosis not present

## 2022-09-19 DIAGNOSIS — Z794 Long term (current) use of insulin: Secondary | ICD-10-CM | POA: Diagnosis not present

## 2022-09-19 DIAGNOSIS — Z6841 Body Mass Index (BMI) 40.0 and over, adult: Secondary | ICD-10-CM | POA: Diagnosis not present

## 2022-09-19 DIAGNOSIS — E1159 Type 2 diabetes mellitus with other circulatory complications: Secondary | ICD-10-CM | POA: Diagnosis not present

## 2022-09-19 DIAGNOSIS — I160 Hypertensive urgency: Secondary | ICD-10-CM | POA: Diagnosis not present

## 2022-09-19 DIAGNOSIS — K529 Noninfective gastroenteritis and colitis, unspecified: Secondary | ICD-10-CM | POA: Diagnosis not present

## 2022-09-19 DIAGNOSIS — I129 Hypertensive chronic kidney disease with stage 1 through stage 4 chronic kidney disease, or unspecified chronic kidney disease: Secondary | ICD-10-CM | POA: Diagnosis not present

## 2022-09-19 DIAGNOSIS — I10 Essential (primary) hypertension: Secondary | ICD-10-CM | POA: Diagnosis not present

## 2022-09-19 DIAGNOSIS — R1110 Vomiting, unspecified: Secondary | ICD-10-CM | POA: Diagnosis not present

## 2022-09-20 DIAGNOSIS — I160 Hypertensive urgency: Secondary | ICD-10-CM | POA: Diagnosis not present

## 2022-09-20 DIAGNOSIS — Z6841 Body Mass Index (BMI) 40.0 and over, adult: Secondary | ICD-10-CM | POA: Diagnosis not present

## 2022-09-20 DIAGNOSIS — R42 Dizziness and giddiness: Secondary | ICD-10-CM | POA: Diagnosis not present

## 2022-09-20 DIAGNOSIS — N1831 Chronic kidney disease, stage 3a: Secondary | ICD-10-CM | POA: Diagnosis not present

## 2022-09-20 DIAGNOSIS — R112 Nausea with vomiting, unspecified: Secondary | ICD-10-CM | POA: Diagnosis not present

## 2022-09-20 DIAGNOSIS — E1165 Type 2 diabetes mellitus with hyperglycemia: Secondary | ICD-10-CM | POA: Diagnosis not present

## 2022-09-20 DIAGNOSIS — R519 Headache, unspecified: Secondary | ICD-10-CM | POA: Diagnosis not present

## 2022-09-20 DIAGNOSIS — Z981 Arthrodesis status: Secondary | ICD-10-CM | POA: Diagnosis not present

## 2022-09-20 DIAGNOSIS — R110 Nausea: Secondary | ICD-10-CM | POA: Diagnosis not present

## 2022-09-20 DIAGNOSIS — Z794 Long term (current) use of insulin: Secondary | ICD-10-CM | POA: Diagnosis not present

## 2022-09-21 DIAGNOSIS — R519 Headache, unspecified: Secondary | ICD-10-CM | POA: Diagnosis not present

## 2022-09-23 DIAGNOSIS — R739 Hyperglycemia, unspecified: Secondary | ICD-10-CM | POA: Diagnosis not present

## 2022-09-23 DIAGNOSIS — N1831 Chronic kidney disease, stage 3a: Secondary | ICD-10-CM | POA: Diagnosis not present

## 2022-09-23 DIAGNOSIS — R519 Headache, unspecified: Secondary | ICD-10-CM | POA: Diagnosis not present

## 2022-09-23 DIAGNOSIS — E1122 Type 2 diabetes mellitus with diabetic chronic kidney disease: Secondary | ICD-10-CM | POA: Diagnosis not present

## 2022-09-23 DIAGNOSIS — E1159 Type 2 diabetes mellitus with other circulatory complications: Secondary | ICD-10-CM | POA: Diagnosis not present

## 2022-09-23 DIAGNOSIS — R109 Unspecified abdominal pain: Secondary | ICD-10-CM | POA: Diagnosis not present

## 2022-09-23 DIAGNOSIS — G4489 Other headache syndrome: Secondary | ICD-10-CM | POA: Diagnosis not present

## 2022-09-23 DIAGNOSIS — R112 Nausea with vomiting, unspecified: Secondary | ICD-10-CM | POA: Diagnosis not present

## 2022-09-23 DIAGNOSIS — R197 Diarrhea, unspecified: Secondary | ICD-10-CM | POA: Diagnosis not present

## 2022-09-23 DIAGNOSIS — H53149 Visual discomfort, unspecified: Secondary | ICD-10-CM | POA: Diagnosis not present

## 2022-09-23 DIAGNOSIS — I10 Essential (primary) hypertension: Secondary | ICD-10-CM | POA: Diagnosis not present

## 2022-09-23 DIAGNOSIS — I129 Hypertensive chronic kidney disease with stage 1 through stage 4 chronic kidney disease, or unspecified chronic kidney disease: Secondary | ICD-10-CM | POA: Diagnosis not present

## 2022-09-24 ENCOUNTER — Ambulatory Visit (INDEPENDENT_AMBULATORY_CARE_PROVIDER_SITE_OTHER): Payer: Medicare HMO | Admitting: Family Medicine

## 2022-09-24 ENCOUNTER — Encounter: Payer: Self-pay | Admitting: Family Medicine

## 2022-09-24 VITALS — BP 142/75 | HR 66 | Ht 62.0 in | Wt 255.0 lb

## 2022-09-24 DIAGNOSIS — G43819 Other migraine, intractable, without status migrainosus: Secondary | ICD-10-CM | POA: Diagnosis not present

## 2022-09-24 DIAGNOSIS — E1165 Type 2 diabetes mellitus with hyperglycemia: Secondary | ICD-10-CM

## 2022-09-24 DIAGNOSIS — I152 Hypertension secondary to endocrine disorders: Secondary | ICD-10-CM

## 2022-09-24 DIAGNOSIS — E1159 Type 2 diabetes mellitus with other circulatory complications: Secondary | ICD-10-CM

## 2022-09-24 DIAGNOSIS — K529 Noninfective gastroenteritis and colitis, unspecified: Secondary | ICD-10-CM | POA: Diagnosis not present

## 2022-09-24 DIAGNOSIS — G43909 Migraine, unspecified, not intractable, without status migrainosus: Secondary | ICD-10-CM | POA: Insufficient documentation

## 2022-09-24 DIAGNOSIS — K219 Gastro-esophageal reflux disease without esophagitis: Secondary | ICD-10-CM

## 2022-09-24 DIAGNOSIS — M48062 Spinal stenosis, lumbar region with neurogenic claudication: Secondary | ICD-10-CM | POA: Diagnosis not present

## 2022-09-24 DIAGNOSIS — E11319 Type 2 diabetes mellitus with unspecified diabetic retinopathy without macular edema: Secondary | ICD-10-CM | POA: Diagnosis not present

## 2022-09-24 DIAGNOSIS — Z124 Encounter for screening for malignant neoplasm of cervix: Secondary | ICD-10-CM | POA: Diagnosis not present

## 2022-09-24 DIAGNOSIS — M4316 Spondylolisthesis, lumbar region: Secondary | ICD-10-CM | POA: Diagnosis not present

## 2022-09-24 DIAGNOSIS — E1122 Type 2 diabetes mellitus with diabetic chronic kidney disease: Secondary | ICD-10-CM | POA: Diagnosis not present

## 2022-09-24 DIAGNOSIS — E1142 Type 2 diabetes mellitus with diabetic polyneuropathy: Secondary | ICD-10-CM | POA: Diagnosis not present

## 2022-09-24 DIAGNOSIS — Z794 Long term (current) use of insulin: Secondary | ICD-10-CM

## 2022-09-24 DIAGNOSIS — N1831 Chronic kidney disease, stage 3a: Secondary | ICD-10-CM | POA: Diagnosis not present

## 2022-09-24 LAB — POCT UA - MICROALBUMIN
Albumin/Creatinine Ratio, Urine, POC: >300
Creatinine, POC: 50 mg/dL
Microalbumin Ur, POC: 150 mg/L

## 2022-09-24 LAB — POCT GLYCOSYLATED HEMOGLOBIN (HGB A1C): HbA1c, POC (controlled diabetic range): 8 % — AB (ref 0.0–7.0)

## 2022-09-24 MED ORDER — ONDANSETRON 4 MG PO TBDP
ORAL_TABLET | ORAL | 0 refills | Status: DC
Start: 2022-09-24 — End: 2022-11-06

## 2022-09-24 NOTE — Progress Notes (Signed)
Gabriela Guerrero - 62 y.o. female MRN [id number]  Date of birth: 11-30-1960  Subjective Chief Complaint  Patient presents with   Headache    HPI Gabriela Guerrero is a 62 y.o. female here today for hospital follow-up.  She was admitted for headache found to have hypertensive urgency.  Initially had abdominal pain and constipation followed by loose stools and had not taken her medication for few days.  Blood pressure was 205/91.  CT negative.  Intraocular pressure checked and was normal bilaterally.  No papilledema on ophthalmologic exam.  She was given clonidine, hydralazine, Dilaudid, Toradol, Solu-Medrol, morphine, Zofran and Phenergan in the emergency department to address her headache and elevated blood pressure.  Continue to have symptoms and was admitted for continued observation.  She did have an MRI which was negative.  Her blood pressure did improve after resuming her home regimen.  She reports that she continues to have dull headache.  She was seen yesterday in the ED as well for similar symptoms.  Blood pressure has been better controlled but still elevated.  She reports that she was prescribed a migraine medication however she is unsure of the name of this.  I do not see anything in her previous discharge summary or from her ER visit from yesterday however note was unavailable at the time of her visit.   She has been off Ozempic for couple weeks.  She is not monitoring her blood sugars at home regularly.  A1c is at 8.0% today.  Her meals are through [employer].  She has had some nausea with her Ozempic.  In addition of this she is using insulin.  She does use this "most of the time".  ROS:  A comprehensive ROS was completed and negative except as noted per HPI  No Known Allergies  History reviewed. No pertinent past medical history.  Past Surgical History:  Procedure Laterality Date   LUMBAR FUSION      Social History   Socioeconomic History   Marital status: Single    Spouse  name: Not on file   Number of children: Not on file   Years of education: Not on file   Highest education level: Not on file  Occupational History   Occupation: Disabled  Tobacco Use   Smoking status: Never   Smokeless tobacco: Never  Vaping Use   Vaping Use: Never used  Substance and Sexual Activity   Alcohol use: Never   Drug use: Never   Sexual activity: Not Currently    Partners: Male    Birth control/protection: Abstinence  Other Topics Concern   Not on file  Social History Narrative   Not on file   Social Determinants of Health   Financial Resource Strain: Not on file  Food Insecurity: Not on file  Transportation Needs: Not on file  Physical Activity: Not on file  Stress: Not on file  Social Connections: Not on file    Family History  Problem Relation Age of Onset   Hypertension Mother    Breast cancer Mother    Hypertension Sister    Stroke Sister     Health Maintenance  Topic Date Due   OPHTHALMOLOGY EXAM  Never done   Diabetic kidney evaluation - eGFR measurement  03/22/2022   COVID-19 Vaccine (3 - 2023-24 season) 10/10/2022 (Originally 12/21/2021)   FOOT EXAM  12/25/2022 (Originally 03/27/1971)   PAP SMEAR-Modifier  12/25/2022 (Originally 03/26/1982)   Medicare Annual Wellness (AWV)  12/25/2022 (Originally Feb 26, 1961)   Zoster Vaccines-  Shingrix (1 of 2) 12/25/2022 (Originally 03/27/2011)   MAMMOGRAM  01/03/2023 (Originally 04/20/2020)   Hepatitis C Screening  01/03/2023 (Originally 03/27/1979)   HIV Screening  01/03/2023 (Originally 03/26/1976)   INFLUENZA VACCINE  11/21/2022   HEMOGLOBIN A1C  03/26/2023   Diabetic kidney evaluation - Urine ACR  09/24/2023   DTaP/Tdap/Td (2 - Td or Tdap) 06/26/2025   Colonoscopy  01/31/2029   HPV VACCINES  Aged Out      ----------------------------------------------------------------------------------------------------------------------------------------------------------------------------------------------------------------- Physical Exam BP (!) 142/75 (BP Location: Left Arm, Patient Position: Sitting, Cuff Size: Large)   Pulse 66   Ht 5\' 2"  (1.575 m)   Wt 255 lb (115.7 kg)   SpO2 100%   BMI 46.64 kg/m   Physical Exam Constitutional:      Appearance: She is well-developed.  HENT:     Head: Normocephalic and atraumatic.  Eyes:     General: No scleral icterus. Cardiovascular:     Rate and Rhythm: Normal rate and regular rhythm.  Pulmonary:     Effort: Pulmonary effort is normal.     Breath sounds: Normal breath sounds.  Musculoskeletal:     Cervical back: Neck supple.  Neurological:     General: No focal deficit present.     Mental Status: She is alert.  Psychiatric:        Mood and Affect: Mood normal.        Behavior: Behavior normal.     ------------------------------------------------------------------------------------------------------------------------------------------------------------------------------------------------------------------- Assessment and Plan  Hypertension associated with diabetes (HCC) BP remains elevated.  She is taking home regimen of prescribed medications.  Referral placed to advanced hypertension clinic.  Migraine She has had recurrent episodes of headache and migraine.  Initially thought to be related to blood pressure however blood pressure is now better controlled and symptoms persist.  MRI of the brain as well as MRV were unremarkable.  Asked her to let me know what she was prescribed for her migraines from the ER.  Referral placed to neurology.  Diabetic retinopathy (HCC) She has not had recent visit with ophthalmology.  Referral placed.  Uncontrolled type 2 diabetes mellitus with hyperglycemia, with long-term current use of insulin (HCC) This  has been managed by endocrinology.  A1c has improved some since last visit.  Encouraged her to schedule follow-up with her endocrinologist as well.   Meds ordered this encounter  Medications   ondansetron (ZOFRAN-ODT) 4 MG disintegrating tablet    Sig: DISSOLVE 1 TABLET ON THE TONGUE EVERY 8 HOURS AS NEEDED FOR NAUSEA AND VOMITING    Dispense:  20 tablet    Refill:  0    Return in about 6 weeks (around 11/05/2022) for F/u HTN/T2DM.    This visit occurred during the SARS-CoV-2 public health emergency.  Safety protocols were in place, including screening questions prior to the visit, additional usage of staff PPE, and extensive cleaning of exam room while observing appropriate contact time as indicated for disinfecting solutions.

## 2022-09-24 NOTE — Assessment & Plan Note (Signed)
BP remains elevated.  She is taking home regimen of prescribed medications.  Referral placed to advanced hypertension clinic.

## 2022-09-24 NOTE — Assessment & Plan Note (Signed)
She has not had recent visit with ophthalmology.  Referral placed.

## 2022-09-24 NOTE — Assessment & Plan Note (Signed)
She has had recurrent episodes of headache and migraine.  Initially thought to be related to blood pressure however blood pressure is now better controlled and symptoms persist.  MRI of the brain as well as MRV were unremarkable.  Asked her to let me know what she was prescribed for her migraines from the ER.  Referral placed to neurology.

## 2022-09-24 NOTE — Patient Instructions (Addendum)
Check to see if you have your CPAP machine.  If you need equipment let us know.  IF you have all of the equipment, use this every night.   Restart ozempic-0.25mg  x2 weeks then increase to 0.5mg .    Let me know what you were prescribed for headaches, I don't see this in your hospital record.   Referrals placed to neurology, advanced hypertension clinic, GYN and Ophthalmology.

## 2022-09-24 NOTE — Assessment & Plan Note (Signed)
This has been managed by endocrinology.  A1c has improved some since last visit.  Encouraged her to schedule follow-up with her endocrinologist as well.

## 2022-09-25 ENCOUNTER — Telehealth: Payer: Self-pay | Admitting: Family Medicine

## 2022-09-25 NOTE — Telephone Encounter (Signed)
[facility] called they're requesting additional home pt at twice a week for 4 weeks and once a week for 4 weeks and also ot evaluation for ADL Safety Please contact Vincenza Hews [phone]

## 2022-09-26 NOTE — Telephone Encounter (Signed)
VO's given per Dr. Ashley Royalty consent.

## 2022-09-30 ENCOUNTER — Other Ambulatory Visit: Payer: Self-pay | Admitting: Family Medicine

## 2022-09-30 MED ORDER — BUTALBITAL-APAP-CAFFEINE 50-325-40 MG PO TABS: 1.0000 | ORAL_TABLET | Freq: Four times a day (QID) | ORAL | 0 refills | Status: DC | PRN

## 2022-09-30 NOTE — Telephone Encounter (Signed)
Per pt, Dr Ashley Royalty told her to call to give him the name of med given to her in hosp-Butal/acetamn/CF 50-325 40 tab

## 2022-09-30 NOTE — Telephone Encounter (Signed)
Forwarding Rx to Dr. Ashley Royalty for consideration.

## 2022-10-01 DIAGNOSIS — N1831 Chronic kidney disease, stage 3a: Secondary | ICD-10-CM | POA: Diagnosis not present

## 2022-10-01 DIAGNOSIS — E1122 Type 2 diabetes mellitus with diabetic chronic kidney disease: Secondary | ICD-10-CM | POA: Diagnosis not present

## 2022-10-01 DIAGNOSIS — E1165 Type 2 diabetes mellitus with hyperglycemia: Secondary | ICD-10-CM | POA: Diagnosis not present

## 2022-10-01 DIAGNOSIS — K529 Noninfective gastroenteritis and colitis, unspecified: Secondary | ICD-10-CM | POA: Diagnosis not present

## 2022-10-01 DIAGNOSIS — M48062 Spinal stenosis, lumbar region with neurogenic claudication: Secondary | ICD-10-CM | POA: Diagnosis not present

## 2022-10-01 DIAGNOSIS — E1142 Type 2 diabetes mellitus with diabetic polyneuropathy: Secondary | ICD-10-CM | POA: Diagnosis not present

## 2022-10-01 DIAGNOSIS — I152 Hypertension secondary to endocrine disorders: Secondary | ICD-10-CM | POA: Diagnosis not present

## 2022-10-01 DIAGNOSIS — M4316 Spondylolisthesis, lumbar region: Secondary | ICD-10-CM | POA: Diagnosis not present

## 2022-10-01 DIAGNOSIS — E1159 Type 2 diabetes mellitus with other circulatory complications: Secondary | ICD-10-CM | POA: Diagnosis not present

## 2022-10-02 DIAGNOSIS — N1831 Chronic kidney disease, stage 3a: Secondary | ICD-10-CM | POA: Diagnosis not present

## 2022-10-02 DIAGNOSIS — E1159 Type 2 diabetes mellitus with other circulatory complications: Secondary | ICD-10-CM | POA: Diagnosis not present

## 2022-10-02 DIAGNOSIS — M48062 Spinal stenosis, lumbar region with neurogenic claudication: Secondary | ICD-10-CM | POA: Diagnosis not present

## 2022-10-02 DIAGNOSIS — K529 Noninfective gastroenteritis and colitis, unspecified: Secondary | ICD-10-CM | POA: Diagnosis not present

## 2022-10-02 DIAGNOSIS — E1165 Type 2 diabetes mellitus with hyperglycemia: Secondary | ICD-10-CM | POA: Diagnosis not present

## 2022-10-02 DIAGNOSIS — I152 Hypertension secondary to endocrine disorders: Secondary | ICD-10-CM | POA: Diagnosis not present

## 2022-10-02 DIAGNOSIS — M4316 Spondylolisthesis, lumbar region: Secondary | ICD-10-CM | POA: Diagnosis not present

## 2022-10-02 DIAGNOSIS — E1122 Type 2 diabetes mellitus with diabetic chronic kidney disease: Secondary | ICD-10-CM | POA: Diagnosis not present

## 2022-10-02 DIAGNOSIS — E1142 Type 2 diabetes mellitus with diabetic polyneuropathy: Secondary | ICD-10-CM | POA: Diagnosis not present

## 2022-10-05 DIAGNOSIS — F4323 Adjustment disorder with mixed anxiety and depressed mood: Secondary | ICD-10-CM | POA: Diagnosis not present

## 2022-10-07 ENCOUNTER — Telehealth: Payer: Self-pay | Admitting: Family Medicine

## 2022-10-07 DIAGNOSIS — E1165 Type 2 diabetes mellitus with hyperglycemia: Secondary | ICD-10-CM | POA: Diagnosis not present

## 2022-10-07 DIAGNOSIS — M4316 Spondylolisthesis, lumbar region: Secondary | ICD-10-CM | POA: Diagnosis not present

## 2022-10-07 DIAGNOSIS — M79671 Pain in right foot: Secondary | ICD-10-CM

## 2022-10-07 DIAGNOSIS — E1142 Type 2 diabetes mellitus with diabetic polyneuropathy: Secondary | ICD-10-CM | POA: Diagnosis not present

## 2022-10-07 DIAGNOSIS — I152 Hypertension secondary to endocrine disorders: Secondary | ICD-10-CM | POA: Diagnosis not present

## 2022-10-07 DIAGNOSIS — E1122 Type 2 diabetes mellitus with diabetic chronic kidney disease: Secondary | ICD-10-CM | POA: Diagnosis not present

## 2022-10-07 DIAGNOSIS — E1159 Type 2 diabetes mellitus with other circulatory complications: Secondary | ICD-10-CM | POA: Diagnosis not present

## 2022-10-07 DIAGNOSIS — M48062 Spinal stenosis, lumbar region with neurogenic claudication: Secondary | ICD-10-CM | POA: Diagnosis not present

## 2022-10-07 DIAGNOSIS — K529 Noninfective gastroenteritis and colitis, unspecified: Secondary | ICD-10-CM | POA: Diagnosis not present

## 2022-10-07 DIAGNOSIS — N1831 Chronic kidney disease, stage 3a: Secondary | ICD-10-CM | POA: Diagnosis not present

## 2022-10-07 NOTE — Telephone Encounter (Signed)
Yes for her diabetic foot care

## 2022-10-07 NOTE — Telephone Encounter (Signed)
Patient called she stated that ot recommend she see a podiatrist Dr. Molly Maduro L. Sprinkles

## 2022-10-07 NOTE — Telephone Encounter (Signed)
Is she requesting a referral?  If so, what problem is she requesting this referral for?

## 2022-10-08 NOTE — Addendum Note (Signed)
Addended by: Mammie Lorenzo on: 10/08/2022 12:31 PM   Modules accepted: Orders

## 2022-10-09 DIAGNOSIS — E1142 Type 2 diabetes mellitus with diabetic polyneuropathy: Secondary | ICD-10-CM | POA: Diagnosis not present

## 2022-10-09 DIAGNOSIS — E1122 Type 2 diabetes mellitus with diabetic chronic kidney disease: Secondary | ICD-10-CM | POA: Diagnosis not present

## 2022-10-09 DIAGNOSIS — E1165 Type 2 diabetes mellitus with hyperglycemia: Secondary | ICD-10-CM | POA: Diagnosis not present

## 2022-10-09 DIAGNOSIS — N1831 Chronic kidney disease, stage 3a: Secondary | ICD-10-CM | POA: Diagnosis not present

## 2022-10-09 DIAGNOSIS — E1159 Type 2 diabetes mellitus with other circulatory complications: Secondary | ICD-10-CM | POA: Diagnosis not present

## 2022-10-09 DIAGNOSIS — K529 Noninfective gastroenteritis and colitis, unspecified: Secondary | ICD-10-CM | POA: Diagnosis not present

## 2022-10-09 DIAGNOSIS — M48062 Spinal stenosis, lumbar region with neurogenic claudication: Secondary | ICD-10-CM | POA: Diagnosis not present

## 2022-10-09 DIAGNOSIS — M4316 Spondylolisthesis, lumbar region: Secondary | ICD-10-CM | POA: Diagnosis not present

## 2022-10-09 DIAGNOSIS — I152 Hypertension secondary to endocrine disorders: Secondary | ICD-10-CM | POA: Diagnosis not present

## 2022-10-11 DIAGNOSIS — E1142 Type 2 diabetes mellitus with diabetic polyneuropathy: Secondary | ICD-10-CM | POA: Diagnosis not present

## 2022-10-11 DIAGNOSIS — E1122 Type 2 diabetes mellitus with diabetic chronic kidney disease: Secondary | ICD-10-CM | POA: Diagnosis not present

## 2022-10-11 DIAGNOSIS — E1165 Type 2 diabetes mellitus with hyperglycemia: Secondary | ICD-10-CM | POA: Diagnosis not present

## 2022-10-11 DIAGNOSIS — M4316 Spondylolisthesis, lumbar region: Secondary | ICD-10-CM | POA: Diagnosis not present

## 2022-10-11 DIAGNOSIS — I152 Hypertension secondary to endocrine disorders: Secondary | ICD-10-CM | POA: Diagnosis not present

## 2022-10-11 DIAGNOSIS — K529 Noninfective gastroenteritis and colitis, unspecified: Secondary | ICD-10-CM | POA: Diagnosis not present

## 2022-10-11 DIAGNOSIS — N1831 Chronic kidney disease, stage 3a: Secondary | ICD-10-CM | POA: Diagnosis not present

## 2022-10-11 DIAGNOSIS — E1159 Type 2 diabetes mellitus with other circulatory complications: Secondary | ICD-10-CM | POA: Diagnosis not present

## 2022-10-11 DIAGNOSIS — M48062 Spinal stenosis, lumbar region with neurogenic claudication: Secondary | ICD-10-CM | POA: Diagnosis not present

## 2022-10-12 DIAGNOSIS — F4323 Adjustment disorder with mixed anxiety and depressed mood: Secondary | ICD-10-CM | POA: Diagnosis not present

## 2022-10-14 DIAGNOSIS — E1159 Type 2 diabetes mellitus with other circulatory complications: Secondary | ICD-10-CM | POA: Diagnosis not present

## 2022-10-14 DIAGNOSIS — M48062 Spinal stenosis, lumbar region with neurogenic claudication: Secondary | ICD-10-CM | POA: Diagnosis not present

## 2022-10-14 DIAGNOSIS — E1142 Type 2 diabetes mellitus with diabetic polyneuropathy: Secondary | ICD-10-CM | POA: Diagnosis not present

## 2022-10-14 DIAGNOSIS — N1831 Chronic kidney disease, stage 3a: Secondary | ICD-10-CM | POA: Diagnosis not present

## 2022-10-14 DIAGNOSIS — E1165 Type 2 diabetes mellitus with hyperglycemia: Secondary | ICD-10-CM | POA: Diagnosis not present

## 2022-10-14 DIAGNOSIS — E1122 Type 2 diabetes mellitus with diabetic chronic kidney disease: Secondary | ICD-10-CM | POA: Diagnosis not present

## 2022-10-14 DIAGNOSIS — I152 Hypertension secondary to endocrine disorders: Secondary | ICD-10-CM | POA: Diagnosis not present

## 2022-10-14 DIAGNOSIS — M4316 Spondylolisthesis, lumbar region: Secondary | ICD-10-CM | POA: Diagnosis not present

## 2022-10-14 DIAGNOSIS — K529 Noninfective gastroenteritis and colitis, unspecified: Secondary | ICD-10-CM | POA: Diagnosis not present

## 2022-10-15 DIAGNOSIS — M4316 Spondylolisthesis, lumbar region: Secondary | ICD-10-CM | POA: Diagnosis not present

## 2022-10-15 DIAGNOSIS — E1122 Type 2 diabetes mellitus with diabetic chronic kidney disease: Secondary | ICD-10-CM | POA: Diagnosis not present

## 2022-10-15 DIAGNOSIS — N1831 Chronic kidney disease, stage 3a: Secondary | ICD-10-CM | POA: Diagnosis not present

## 2022-10-15 DIAGNOSIS — I152 Hypertension secondary to endocrine disorders: Secondary | ICD-10-CM | POA: Diagnosis not present

## 2022-10-15 DIAGNOSIS — E1142 Type 2 diabetes mellitus with diabetic polyneuropathy: Secondary | ICD-10-CM | POA: Diagnosis not present

## 2022-10-15 DIAGNOSIS — M48062 Spinal stenosis, lumbar region with neurogenic claudication: Secondary | ICD-10-CM | POA: Diagnosis not present

## 2022-10-15 DIAGNOSIS — E1159 Type 2 diabetes mellitus with other circulatory complications: Secondary | ICD-10-CM | POA: Diagnosis not present

## 2022-10-15 DIAGNOSIS — E1165 Type 2 diabetes mellitus with hyperglycemia: Secondary | ICD-10-CM | POA: Diagnosis not present

## 2022-10-15 DIAGNOSIS — K529 Noninfective gastroenteritis and colitis, unspecified: Secondary | ICD-10-CM | POA: Diagnosis not present

## 2022-10-16 ENCOUNTER — Other Ambulatory Visit: Payer: Self-pay | Admitting: Family Medicine

## 2022-10-16 DIAGNOSIS — E1159 Type 2 diabetes mellitus with other circulatory complications: Secondary | ICD-10-CM

## 2022-10-17 DIAGNOSIS — M4316 Spondylolisthesis, lumbar region: Secondary | ICD-10-CM | POA: Diagnosis not present

## 2022-10-17 DIAGNOSIS — I152 Hypertension secondary to endocrine disorders: Secondary | ICD-10-CM | POA: Diagnosis not present

## 2022-10-17 DIAGNOSIS — E1142 Type 2 diabetes mellitus with diabetic polyneuropathy: Secondary | ICD-10-CM | POA: Diagnosis not present

## 2022-10-17 DIAGNOSIS — E1159 Type 2 diabetes mellitus with other circulatory complications: Secondary | ICD-10-CM | POA: Diagnosis not present

## 2022-10-17 DIAGNOSIS — E1165 Type 2 diabetes mellitus with hyperglycemia: Secondary | ICD-10-CM | POA: Diagnosis not present

## 2022-10-17 DIAGNOSIS — K529 Noninfective gastroenteritis and colitis, unspecified: Secondary | ICD-10-CM | POA: Diagnosis not present

## 2022-10-17 DIAGNOSIS — N1831 Chronic kidney disease, stage 3a: Secondary | ICD-10-CM | POA: Diagnosis not present

## 2022-10-17 DIAGNOSIS — E1122 Type 2 diabetes mellitus with diabetic chronic kidney disease: Secondary | ICD-10-CM | POA: Diagnosis not present

## 2022-10-17 DIAGNOSIS — M48062 Spinal stenosis, lumbar region with neurogenic claudication: Secondary | ICD-10-CM | POA: Diagnosis not present

## 2022-10-19 DIAGNOSIS — F4323 Adjustment disorder with mixed anxiety and depressed mood: Secondary | ICD-10-CM | POA: Diagnosis not present

## 2022-10-21 DIAGNOSIS — K529 Noninfective gastroenteritis and colitis, unspecified: Secondary | ICD-10-CM | POA: Diagnosis not present

## 2022-10-21 DIAGNOSIS — E1122 Type 2 diabetes mellitus with diabetic chronic kidney disease: Secondary | ICD-10-CM | POA: Diagnosis not present

## 2022-10-21 DIAGNOSIS — N1831 Chronic kidney disease, stage 3a: Secondary | ICD-10-CM | POA: Diagnosis not present

## 2022-10-21 DIAGNOSIS — E1142 Type 2 diabetes mellitus with diabetic polyneuropathy: Secondary | ICD-10-CM | POA: Diagnosis not present

## 2022-10-21 DIAGNOSIS — M48062 Spinal stenosis, lumbar region with neurogenic claudication: Secondary | ICD-10-CM | POA: Diagnosis not present

## 2022-10-21 DIAGNOSIS — E1165 Type 2 diabetes mellitus with hyperglycemia: Secondary | ICD-10-CM | POA: Diagnosis not present

## 2022-10-21 DIAGNOSIS — M4316 Spondylolisthesis, lumbar region: Secondary | ICD-10-CM | POA: Diagnosis not present

## 2022-10-21 DIAGNOSIS — I152 Hypertension secondary to endocrine disorders: Secondary | ICD-10-CM | POA: Diagnosis not present

## 2022-10-21 DIAGNOSIS — E1159 Type 2 diabetes mellitus with other circulatory complications: Secondary | ICD-10-CM | POA: Diagnosis not present

## 2022-10-23 DIAGNOSIS — K529 Noninfective gastroenteritis and colitis, unspecified: Secondary | ICD-10-CM | POA: Diagnosis not present

## 2022-10-23 DIAGNOSIS — M48062 Spinal stenosis, lumbar region with neurogenic claudication: Secondary | ICD-10-CM | POA: Diagnosis not present

## 2022-10-23 DIAGNOSIS — E1142 Type 2 diabetes mellitus with diabetic polyneuropathy: Secondary | ICD-10-CM | POA: Diagnosis not present

## 2022-10-23 DIAGNOSIS — E1165 Type 2 diabetes mellitus with hyperglycemia: Secondary | ICD-10-CM | POA: Diagnosis not present

## 2022-10-23 DIAGNOSIS — I152 Hypertension secondary to endocrine disorders: Secondary | ICD-10-CM | POA: Diagnosis not present

## 2022-10-23 DIAGNOSIS — R6889 Other general symptoms and signs: Secondary | ICD-10-CM | POA: Diagnosis not present

## 2022-10-23 DIAGNOSIS — N1831 Chronic kidney disease, stage 3a: Secondary | ICD-10-CM | POA: Diagnosis not present

## 2022-10-23 DIAGNOSIS — E1122 Type 2 diabetes mellitus with diabetic chronic kidney disease: Secondary | ICD-10-CM | POA: Diagnosis not present

## 2022-10-23 DIAGNOSIS — G43E09 Chronic migraine with aura, not intractable, without status migrainosus: Secondary | ICD-10-CM | POA: Diagnosis not present

## 2022-10-23 DIAGNOSIS — M4316 Spondylolisthesis, lumbar region: Secondary | ICD-10-CM | POA: Diagnosis not present

## 2022-10-23 DIAGNOSIS — E1159 Type 2 diabetes mellitus with other circulatory complications: Secondary | ICD-10-CM | POA: Diagnosis not present

## 2022-10-24 DIAGNOSIS — E1142 Type 2 diabetes mellitus with diabetic polyneuropathy: Secondary | ICD-10-CM | POA: Diagnosis not present

## 2022-10-24 DIAGNOSIS — I152 Hypertension secondary to endocrine disorders: Secondary | ICD-10-CM | POA: Diagnosis not present

## 2022-10-24 DIAGNOSIS — E1159 Type 2 diabetes mellitus with other circulatory complications: Secondary | ICD-10-CM | POA: Diagnosis not present

## 2022-10-24 DIAGNOSIS — N1831 Chronic kidney disease, stage 3a: Secondary | ICD-10-CM | POA: Diagnosis not present

## 2022-10-24 DIAGNOSIS — E1165 Type 2 diabetes mellitus with hyperglycemia: Secondary | ICD-10-CM | POA: Diagnosis not present

## 2022-10-24 DIAGNOSIS — M48062 Spinal stenosis, lumbar region with neurogenic claudication: Secondary | ICD-10-CM | POA: Diagnosis not present

## 2022-10-24 DIAGNOSIS — E1122 Type 2 diabetes mellitus with diabetic chronic kidney disease: Secondary | ICD-10-CM | POA: Diagnosis not present

## 2022-10-24 DIAGNOSIS — M4316 Spondylolisthesis, lumbar region: Secondary | ICD-10-CM | POA: Diagnosis not present

## 2022-10-24 DIAGNOSIS — K529 Noninfective gastroenteritis and colitis, unspecified: Secondary | ICD-10-CM | POA: Diagnosis not present

## 2022-10-26 DIAGNOSIS — E1122 Type 2 diabetes mellitus with diabetic chronic kidney disease: Secondary | ICD-10-CM | POA: Diagnosis not present

## 2022-10-26 DIAGNOSIS — E1159 Type 2 diabetes mellitus with other circulatory complications: Secondary | ICD-10-CM | POA: Diagnosis not present

## 2022-10-26 DIAGNOSIS — N1831 Chronic kidney disease, stage 3a: Secondary | ICD-10-CM | POA: Diagnosis not present

## 2022-10-26 DIAGNOSIS — E1142 Type 2 diabetes mellitus with diabetic polyneuropathy: Secondary | ICD-10-CM | POA: Diagnosis not present

## 2022-10-26 DIAGNOSIS — M4316 Spondylolisthesis, lumbar region: Secondary | ICD-10-CM | POA: Diagnosis not present

## 2022-10-26 DIAGNOSIS — I152 Hypertension secondary to endocrine disorders: Secondary | ICD-10-CM | POA: Diagnosis not present

## 2022-10-26 DIAGNOSIS — E1165 Type 2 diabetes mellitus with hyperglycemia: Secondary | ICD-10-CM | POA: Diagnosis not present

## 2022-10-26 DIAGNOSIS — K529 Noninfective gastroenteritis and colitis, unspecified: Secondary | ICD-10-CM | POA: Diagnosis not present

## 2022-10-26 DIAGNOSIS — M48062 Spinal stenosis, lumbar region with neurogenic claudication: Secondary | ICD-10-CM | POA: Diagnosis not present

## 2022-10-31 DIAGNOSIS — N1831 Chronic kidney disease, stage 3a: Secondary | ICD-10-CM | POA: Diagnosis not present

## 2022-10-31 DIAGNOSIS — E1142 Type 2 diabetes mellitus with diabetic polyneuropathy: Secondary | ICD-10-CM | POA: Diagnosis not present

## 2022-10-31 DIAGNOSIS — M4316 Spondylolisthesis, lumbar region: Secondary | ICD-10-CM | POA: Diagnosis not present

## 2022-10-31 DIAGNOSIS — I152 Hypertension secondary to endocrine disorders: Secondary | ICD-10-CM | POA: Diagnosis not present

## 2022-10-31 DIAGNOSIS — E1122 Type 2 diabetes mellitus with diabetic chronic kidney disease: Secondary | ICD-10-CM | POA: Diagnosis not present

## 2022-10-31 DIAGNOSIS — E1159 Type 2 diabetes mellitus with other circulatory complications: Secondary | ICD-10-CM | POA: Diagnosis not present

## 2022-10-31 DIAGNOSIS — K529 Noninfective gastroenteritis and colitis, unspecified: Secondary | ICD-10-CM | POA: Diagnosis not present

## 2022-10-31 DIAGNOSIS — M48062 Spinal stenosis, lumbar region with neurogenic claudication: Secondary | ICD-10-CM | POA: Diagnosis not present

## 2022-10-31 DIAGNOSIS — E1165 Type 2 diabetes mellitus with hyperglycemia: Secondary | ICD-10-CM | POA: Diagnosis not present

## 2022-11-01 DIAGNOSIS — M48062 Spinal stenosis, lumbar region with neurogenic claudication: Secondary | ICD-10-CM | POA: Diagnosis not present

## 2022-11-01 DIAGNOSIS — E1165 Type 2 diabetes mellitus with hyperglycemia: Secondary | ICD-10-CM | POA: Diagnosis not present

## 2022-11-01 DIAGNOSIS — M4316 Spondylolisthesis, lumbar region: Secondary | ICD-10-CM | POA: Diagnosis not present

## 2022-11-01 DIAGNOSIS — K529 Noninfective gastroenteritis and colitis, unspecified: Secondary | ICD-10-CM | POA: Diagnosis not present

## 2022-11-01 DIAGNOSIS — E1159 Type 2 diabetes mellitus with other circulatory complications: Secondary | ICD-10-CM | POA: Diagnosis not present

## 2022-11-01 DIAGNOSIS — I152 Hypertension secondary to endocrine disorders: Secondary | ICD-10-CM | POA: Diagnosis not present

## 2022-11-01 DIAGNOSIS — E1142 Type 2 diabetes mellitus with diabetic polyneuropathy: Secondary | ICD-10-CM | POA: Diagnosis not present

## 2022-11-01 DIAGNOSIS — E1122 Type 2 diabetes mellitus with diabetic chronic kidney disease: Secondary | ICD-10-CM | POA: Diagnosis not present

## 2022-11-01 DIAGNOSIS — N1831 Chronic kidney disease, stage 3a: Secondary | ICD-10-CM | POA: Diagnosis not present

## 2022-11-02 DIAGNOSIS — F4323 Adjustment disorder with mixed anxiety and depressed mood: Secondary | ICD-10-CM | POA: Diagnosis not present

## 2022-11-04 DIAGNOSIS — M48062 Spinal stenosis, lumbar region with neurogenic claudication: Secondary | ICD-10-CM | POA: Diagnosis not present

## 2022-11-04 DIAGNOSIS — M4316 Spondylolisthesis, lumbar region: Secondary | ICD-10-CM | POA: Diagnosis not present

## 2022-11-04 DIAGNOSIS — E1165 Type 2 diabetes mellitus with hyperglycemia: Secondary | ICD-10-CM | POA: Diagnosis not present

## 2022-11-04 DIAGNOSIS — E1159 Type 2 diabetes mellitus with other circulatory complications: Secondary | ICD-10-CM | POA: Diagnosis not present

## 2022-11-04 DIAGNOSIS — N1831 Chronic kidney disease, stage 3a: Secondary | ICD-10-CM | POA: Diagnosis not present

## 2022-11-04 DIAGNOSIS — E1122 Type 2 diabetes mellitus with diabetic chronic kidney disease: Secondary | ICD-10-CM | POA: Diagnosis not present

## 2022-11-04 DIAGNOSIS — I152 Hypertension secondary to endocrine disorders: Secondary | ICD-10-CM | POA: Diagnosis not present

## 2022-11-04 DIAGNOSIS — E1142 Type 2 diabetes mellitus with diabetic polyneuropathy: Secondary | ICD-10-CM | POA: Diagnosis not present

## 2022-11-04 DIAGNOSIS — K529 Noninfective gastroenteritis and colitis, unspecified: Secondary | ICD-10-CM | POA: Diagnosis not present

## 2022-11-05 ENCOUNTER — Ambulatory Visit: Payer: Medicare HMO | Admitting: Family Medicine

## 2022-11-06 ENCOUNTER — Other Ambulatory Visit: Payer: Self-pay | Admitting: Family Medicine

## 2022-11-06 DIAGNOSIS — K219 Gastro-esophageal reflux disease without esophagitis: Secondary | ICD-10-CM

## 2022-11-07 DIAGNOSIS — B351 Tinea unguium: Secondary | ICD-10-CM | POA: Diagnosis not present

## 2022-11-07 DIAGNOSIS — M79675 Pain in left toe(s): Secondary | ICD-10-CM | POA: Diagnosis not present

## 2022-11-07 DIAGNOSIS — E119 Type 2 diabetes mellitus without complications: Secondary | ICD-10-CM | POA: Diagnosis not present

## 2022-11-07 DIAGNOSIS — R6889 Other general symptoms and signs: Secondary | ICD-10-CM | POA: Diagnosis not present

## 2022-11-07 DIAGNOSIS — M79674 Pain in right toe(s): Secondary | ICD-10-CM | POA: Diagnosis not present

## 2022-11-08 ENCOUNTER — Other Ambulatory Visit: Payer: Self-pay

## 2022-11-08 DIAGNOSIS — F4322 Adjustment disorder with anxiety: Secondary | ICD-10-CM | POA: Diagnosis not present

## 2022-11-08 DIAGNOSIS — E1142 Type 2 diabetes mellitus with diabetic polyneuropathy: Secondary | ICD-10-CM | POA: Diagnosis not present

## 2022-11-08 DIAGNOSIS — E1165 Type 2 diabetes mellitus with hyperglycemia: Secondary | ICD-10-CM | POA: Diagnosis not present

## 2022-11-08 DIAGNOSIS — K529 Noninfective gastroenteritis and colitis, unspecified: Secondary | ICD-10-CM | POA: Diagnosis not present

## 2022-11-08 DIAGNOSIS — I152 Hypertension secondary to endocrine disorders: Secondary | ICD-10-CM | POA: Diagnosis not present

## 2022-11-08 DIAGNOSIS — E1122 Type 2 diabetes mellitus with diabetic chronic kidney disease: Secondary | ICD-10-CM | POA: Diagnosis not present

## 2022-11-08 DIAGNOSIS — N1831 Chronic kidney disease, stage 3a: Secondary | ICD-10-CM | POA: Diagnosis not present

## 2022-11-08 DIAGNOSIS — F4323 Adjustment disorder with mixed anxiety and depressed mood: Secondary | ICD-10-CM | POA: Diagnosis not present

## 2022-11-08 DIAGNOSIS — M48062 Spinal stenosis, lumbar region with neurogenic claudication: Secondary | ICD-10-CM | POA: Diagnosis not present

## 2022-11-08 DIAGNOSIS — E1159 Type 2 diabetes mellitus with other circulatory complications: Secondary | ICD-10-CM | POA: Diagnosis not present

## 2022-11-08 DIAGNOSIS — M4316 Spondylolisthesis, lumbar region: Secondary | ICD-10-CM | POA: Diagnosis not present

## 2022-11-08 MED ORDER — OMEPRAZOLE 40 MG PO CPDR: 40.0000 mg | DELAYED_RELEASE_CAPSULE | Freq: Every day | ORAL | 0 refills | Status: DC

## 2022-11-13 DIAGNOSIS — E1165 Type 2 diabetes mellitus with hyperglycemia: Secondary | ICD-10-CM | POA: Diagnosis not present

## 2022-11-13 DIAGNOSIS — E1142 Type 2 diabetes mellitus with diabetic polyneuropathy: Secondary | ICD-10-CM | POA: Diagnosis not present

## 2022-11-13 DIAGNOSIS — K529 Noninfective gastroenteritis and colitis, unspecified: Secondary | ICD-10-CM | POA: Diagnosis not present

## 2022-11-13 DIAGNOSIS — E1159 Type 2 diabetes mellitus with other circulatory complications: Secondary | ICD-10-CM | POA: Diagnosis not present

## 2022-11-13 DIAGNOSIS — I152 Hypertension secondary to endocrine disorders: Secondary | ICD-10-CM | POA: Diagnosis not present

## 2022-11-13 DIAGNOSIS — M4316 Spondylolisthesis, lumbar region: Secondary | ICD-10-CM | POA: Diagnosis not present

## 2022-11-13 DIAGNOSIS — M48062 Spinal stenosis, lumbar region with neurogenic claudication: Secondary | ICD-10-CM | POA: Diagnosis not present

## 2022-11-13 DIAGNOSIS — E1122 Type 2 diabetes mellitus with diabetic chronic kidney disease: Secondary | ICD-10-CM | POA: Diagnosis not present

## 2022-11-13 DIAGNOSIS — N1831 Chronic kidney disease, stage 3a: Secondary | ICD-10-CM | POA: Diagnosis not present

## 2022-11-15 DIAGNOSIS — F4323 Adjustment disorder with mixed anxiety and depressed mood: Secondary | ICD-10-CM | POA: Diagnosis not present

## 2022-11-15 DIAGNOSIS — E1122 Type 2 diabetes mellitus with diabetic chronic kidney disease: Secondary | ICD-10-CM | POA: Diagnosis not present

## 2022-11-15 DIAGNOSIS — E1165 Type 2 diabetes mellitus with hyperglycemia: Secondary | ICD-10-CM | POA: Diagnosis not present

## 2022-11-15 DIAGNOSIS — M48062 Spinal stenosis, lumbar region with neurogenic claudication: Secondary | ICD-10-CM | POA: Diagnosis not present

## 2022-11-15 DIAGNOSIS — N1831 Chronic kidney disease, stage 3a: Secondary | ICD-10-CM | POA: Diagnosis not present

## 2022-11-15 DIAGNOSIS — M4316 Spondylolisthesis, lumbar region: Secondary | ICD-10-CM | POA: Diagnosis not present

## 2022-11-15 DIAGNOSIS — K529 Noninfective gastroenteritis and colitis, unspecified: Secondary | ICD-10-CM | POA: Diagnosis not present

## 2022-11-15 DIAGNOSIS — E1159 Type 2 diabetes mellitus with other circulatory complications: Secondary | ICD-10-CM | POA: Diagnosis not present

## 2022-11-15 DIAGNOSIS — E1142 Type 2 diabetes mellitus with diabetic polyneuropathy: Secondary | ICD-10-CM | POA: Diagnosis not present

## 2022-11-15 DIAGNOSIS — I152 Hypertension secondary to endocrine disorders: Secondary | ICD-10-CM | POA: Diagnosis not present

## 2022-11-18 ENCOUNTER — Other Ambulatory Visit: Payer: Self-pay | Admitting: Family Medicine

## 2022-11-18 ENCOUNTER — Telehealth: Payer: Self-pay | Admitting: Family Medicine

## 2022-11-18 DIAGNOSIS — M4316 Spondylolisthesis, lumbar region: Secondary | ICD-10-CM | POA: Diagnosis not present

## 2022-11-18 DIAGNOSIS — K529 Noninfective gastroenteritis and colitis, unspecified: Secondary | ICD-10-CM | POA: Diagnosis not present

## 2022-11-18 DIAGNOSIS — E1122 Type 2 diabetes mellitus with diabetic chronic kidney disease: Secondary | ICD-10-CM | POA: Diagnosis not present

## 2022-11-18 DIAGNOSIS — N1831 Chronic kidney disease, stage 3a: Secondary | ICD-10-CM | POA: Diagnosis not present

## 2022-11-18 DIAGNOSIS — I152 Hypertension secondary to endocrine disorders: Secondary | ICD-10-CM | POA: Diagnosis not present

## 2022-11-18 DIAGNOSIS — M48062 Spinal stenosis, lumbar region with neurogenic claudication: Secondary | ICD-10-CM | POA: Diagnosis not present

## 2022-11-18 DIAGNOSIS — E1165 Type 2 diabetes mellitus with hyperglycemia: Secondary | ICD-10-CM | POA: Diagnosis not present

## 2022-11-18 DIAGNOSIS — E1142 Type 2 diabetes mellitus with diabetic polyneuropathy: Secondary | ICD-10-CM | POA: Diagnosis not present

## 2022-11-18 DIAGNOSIS — E1159 Type 2 diabetes mellitus with other circulatory complications: Secondary | ICD-10-CM | POA: Diagnosis not present

## 2022-11-18 NOTE — Telephone Encounter (Signed)
Gabriela Guerrero with [facility] called. She is requesting verbal order for patient to have PT 1 time a week for 8 weeks for strengthening,endurance, gait training.

## 2022-11-19 NOTE — Telephone Encounter (Signed)
Left message on Gabriela Guerrero's, from [facility], phone for the ok to verbal orders.   Phone - [phone]  Velna Hatchet also left a message after hours.   She left a message stating blood pressure of patient was 163/90, blood glucose 235 and her pain in her lower back is a 7/10.   I left a message asking if patient was having any symptoms.   I called patient and she denies chest pain or shortness of breath. She states she is taking the blood pressure medications as directed.   She has an appointment with a cardiologist August 1.   I asked her to recheck her vitals.   BP 150/80 74 pulse BS 15 minutes after eating 290.  Pain level 8/10  She didn't check her fasting glucose this morning.

## 2022-11-19 NOTE — Telephone Encounter (Signed)
Noted, continue to monitor and keep appt with cardiology.    CM

## 2022-11-20 NOTE — Telephone Encounter (Signed)
Patient advised.

## 2022-11-21 ENCOUNTER — Telehealth (HOSPITAL_COMMUNITY): Payer: Self-pay | Admitting: Licensed Clinical Social Worker

## 2022-11-21 ENCOUNTER — Ambulatory Visit (INDEPENDENT_AMBULATORY_CARE_PROVIDER_SITE_OTHER): Payer: Medicare HMO | Admitting: Family

## 2022-11-21 ENCOUNTER — Encounter (HOSPITAL_BASED_OUTPATIENT_CLINIC_OR_DEPARTMENT_OTHER): Payer: Self-pay | Admitting: Family

## 2022-11-21 VITALS — BP 153/85 | HR 74 | Ht 62.0 in | Wt 270.0 lb

## 2022-11-21 DIAGNOSIS — E1159 Type 2 diabetes mellitus with other circulatory complications: Secondary | ICD-10-CM | POA: Diagnosis not present

## 2022-11-21 DIAGNOSIS — E782 Mixed hyperlipidemia: Secondary | ICD-10-CM | POA: Diagnosis not present

## 2022-11-21 DIAGNOSIS — Z794 Long term (current) use of insulin: Secondary | ICD-10-CM | POA: Diagnosis not present

## 2022-11-21 DIAGNOSIS — R6889 Other general symptoms and signs: Secondary | ICD-10-CM | POA: Diagnosis not present

## 2022-11-21 DIAGNOSIS — I700 Atherosclerosis of aorta: Secondary | ICD-10-CM

## 2022-11-21 DIAGNOSIS — Z59819 Housing instability, housed unspecified: Secondary | ICD-10-CM

## 2022-11-21 DIAGNOSIS — I152 Hypertension secondary to endocrine disorders: Secondary | ICD-10-CM

## 2022-11-21 MED ORDER — AMLODIPINE BESYLATE-VALSARTAN 10-320 MG PO TABS: 1.0000 | ORAL_TABLET | Freq: Every day | ORAL | 2 refills | Status: DC

## 2022-11-21 NOTE — Patient Instructions (Signed)
Medication Instructions:  Your physician has recommended you make the following change in your medication:   Stop: Amlodipine   Stop: Losartan   Start: Amlodipine- Valsartan 10-320mg  daily    Labwork: Your physician recommends that you return for lab work today- CMP, Direct LDL, renin- aldosterone, and TSH  Your physician recommends that you return for lab work in 2 weeks for BMP    Testing/Procedures: Your physician has requested that you have a renal artery duplex. During this test, an ultrasound is used to evaluate blood flow to the kidneys. Allow one hour for this exam. Do not eat after midnight the day before and avoid carbonated beverages. Take your medications as you usually do.    Follow-Up: Please follow up in 4-6 weeks in [facility] on same day as renal   Referrals:  We have referred you to social work for financial  assistance and housing

## 2022-11-21 NOTE — Progress Notes (Signed)
[facility] Initial Assessment:    Date:  11/21/2022   ID:  Gabriela Guerrero, DOB 04-04-1961, MRN [id number]  PCP:  Everrett Coombe, DO  Cardiologist:  None  Nephrologist:  Referring MD: Everrett Coombe, DO   CC: Hypertension  History of Present Illness:    Gabriela Guerrero is a 62 y.o. female with a hx of hypertension, obesity, aortic atherosclerosis, HLD, DM2, OSA, neuropathy, prior back surgery here to establish care in the [facility].   Prior echo 10/24/21 normal LVEF 65-70%, moderate diastolic dysfunction, LA mildly dilated, mild MR/TR.  Prior CT 07/2020 normal adrenal glands, aortic atherosclerosis.  Gabriela Guerrero was diagnosed with hypertension in her 28s. It has been difficult to control. Blood pressure checked with arm cuff at home although does check on her forearm.  she reports tobacco use never.  For exercise she has no formal routine - exercise tolerance limited by prior failed back surgery. she eats at home and outside of the home and  tries to  follow low sodium diet. Meals through [employer].   She attributes some of her elevated blood pressure to stress.  Her sister passed away in 12-15-2018 from stroke related to high blood pressure.  She is also lost multiple family members over the past few years. She recently started virtual counseling.   Endorses stressors regarding housing condition.  Lives in which she was told was rent control department however she gets [phone] for disability and her rent is $1209. Got aide last month who is helping her go through clothing she recently found mold on from her apartment. She does have HH PT/OT once per week.   Reports bilateral LE edema that is the same all day long. She has had to take her Lasix more often recently - taking twice per week.  Used to be on CPAP but stopped using during a recall she heard about on television. She does still have.   Previous antihypertensives: Losartan-hydrochlorothiazide  Past  Medical History:  Diagnosis Date   Hypertension     Past Surgical History:  Procedure Laterality Date   LUMBAR FUSION      Current Medications: Current Meds  Medication Sig   acetaminophen (TYLENOL) 650 MG CR tablet Take by mouth.   albuterol (VENTOLIN HFA) 108 (90 Base) MCG/ACT inhaler INHALE 2 PUFFS BY MOUTH EVERY 4 TO 6 HOURS AS NEEDED FOR WHEEZING   Alcohol Swabs (DROPSAFE ALCOHOL PREP) 70 % PADS Apply topically.   amLODipine-valsartan (EXFORGE) 10-320 MG tablet Take 1 tablet by mouth daily.   aspirin 81 MG EC tablet Take by mouth.   atorvastatin (LIPITOR) 10 MG tablet TAKE 1 TABLET AT BEDTIME   butalbital-acetaminophen-caffeine (FIORICET) 50-325-40 MG tablet Take 1 tablet by mouth every 6 (six) hours as needed for headache.   carvedilol (COREG) 25 MG tablet Take 1 tablet (25 mg total) by mouth 2 (two) times daily.   cloNIDine (CATAPRES) 0.1 MG tablet TAKE 1 TABLET TWICE DAILY   diphenhydrAMINE (BENADRYL) 25 mg capsule Take by mouth.   furosemide (LASIX) 20 MG tablet Take 1 tablet (20 mg total) by mouth daily as needed.   glucose blood test strip Check sugars twice daily - pre and post prandially   LORazepam (ATIVAN) 0.5 MG tablet Take 1 tablet by mouth twice daily as needed   methocarbamol (ROBAXIN) 750 MG tablet TAKE 1 TABLET THREE TIMES DAILY AS NEEDED   mometasone-formoterol (DULERA) 100-5 MCG/ACT AERO Inhale 2 puffs into the lungs 2 (two) times daily.  nortriptyline (PAMELOR) 10 MG capsule Take 10 mg by mouth at bedtime.   NOVOLOG FLEXPEN 100 UNIT/ML FlexPen Inject into the skin.   nystatin (MYCOSTATIN/NYSTOP) powder Apply 1 application topically 3 (three) times daily.   omeprazole (PRILOSEC) 40 MG capsule Take 1 capsule (40 mg total) by mouth daily.   ondansetron (ZOFRAN-ODT) 4 MG disintegrating tablet DISSOLVE 1 TABLET ON THE TONGUE EVERY 8 HOURS AS NEEDED FOR NAUSEA AND VOMITING   OZEMPIC, 0.25 OR 0.5 MG/DOSE, 2 MG/1.5ML SOPN Inject 0.5 mg into the skin once a week.    potassium chloride (KLOR-CON) 10 MEQ tablet Take 1 tablet (10 mEq total) by mouth daily.   pregabalin (LYRICA) 100 MG capsule Take 1 capsule (100 mg total) by mouth 2 (two) times daily.   sertraline (ZOLOFT) 100 MG tablet Take 1.5 tablets by mouth daily.   SUMAtriptan (IMITREX) 50 MG tablet Take 50 mg by mouth every 2 (two) hours as needed for migraine. May repeat in 2 hours if headache persists or recurs.   TOUJEO MAX SOLOSTAR 300 UNIT/ML Solostar Pen SMARTSIG:60 Unit(s) SUB-Q Daily   traMADol (ULTRAM) 50 MG tablet Take 1-2 tablets (50-100 mg total) by mouth every 6 (six) hours as needed.   [DISCONTINUED] amLODipine (NORVASC) 10 MG tablet Take 1 tablet (10 mg total) by mouth daily.   [DISCONTINUED] losartan (COZAAR) 100 MG tablet Take 1 tablet (100 mg total) by mouth daily.     Allergies:   Patient has no known allergies.   Social History   Socioeconomic History   Marital status: Single    Spouse name: Not on file   Number of children: Not on file   Years of education: Not on file   Highest education level: Not on file  Occupational History   Occupation: Disabled  Tobacco Use   Smoking status: Never   Smokeless tobacco: Never  Vaping Use   Vaping status: Never Used  Substance and Sexual Activity   Alcohol use: Never   Drug use: Never   Sexual activity: Not Currently    Partners: Male    Birth control/protection: Abstinence  Other Topics Concern   Not on file  Social History Narrative   Not on file   Social Determinants of Health   Financial Resource Strain: Low Risk  (10/28/2022)   Received [facility]   Overall Financial Resource Strain (CARDIA)    Difficulty of Paying Living Expenses: Not very hard  Food Insecurity: No Food Insecurity (10/28/2022)   Received from [facility]   Hunger Vital Sign    Worried About Running Out of Food in the Last Year: Never true    Ran Out of Food in the Last Year: Never true  Recent Concern: Food Insecurity - Food Insecurity  Present (09/20/2022)   Received from [facility], [facility]   Hunger Vital Sign    Worried About Running Out of Food in the Last Year: Sometimes true    Ran Out of Food in the Last Year: Never true  Transportation Needs: No Transportation Needs (11/21/2022)   PRAPARE - Administrator, Civil Service (Medical): No    Lack of Transportation (Non-Medical): No  Physical Activity: Unknown (02/22/2022)   Received from [facility], [facility]   Exercise Vital Sign    Days of Exercise per Week: 1 day    Minutes of Exercise per Session: Not on file  Stress: Stress Concern Present (09/20/2022)   Received from [facility], [facility]   [facility]  of Occupational Health - Occupational Stress Questionnaire    Feeling of Stress : Very much  Social Connections: Somewhat Isolated (02/22/2022)   Received from [facility], [facility]   Social Network    How would you rate your social network (family, work, friends)?: Restricted participation with some degree of social isolation     Family History: The patient's family history includes Breast cancer in her mother; Hypertension in her mother and sister; Stroke in her sister.  ROS:   Please see the history of present illness.    All other systems reviewed and are negative.  EKGs/Labs/Other Studies Reviewed:    EKG Interpretation Date/Time:  Thursday November 21 2022 08:50:03 EDT Ventricular Rate:  74 PR Interval:  190 QRS Duration:  120 QT Interval:  396 QTC Calculation: 439 R Axis:   -29  Text Interpretation: Normal sinus rhythm Minimal voltage criteria for LVH, may be normal variant ( Cornell product ) Confirmed by Gillian Shields ([id number]) on 11/21/2022 9:20:34 AM    Recent Labs: No results found for requested labs within last 365 days.   Recent Lipid Panel No results found for: "CHOL", "TRIG", "HDL", "CHOLHDL", "VLDL", "LDLCALC", "LDLDIRECT"  Physical Exam:   VS:  BP (!) 153/85 Comment: left arm  Pulse 74    Ht 5\' 2"  (1.575 m)   Wt 270 lb (122.5 kg)   BMI 49.38 kg/m  , BMI Body mass index is 49.38 kg/m. GENERAL:  Well appearing HEENT: Pupils equal round and reactive, fundi not visualized, oral mucosa unremarkable NECK:  No jugular venous distention, waveform within normal limits, carotid upstroke brisk and symmetric, no bruits, no thyromegaly LYMPHATICS:  No cervical adenopathy LUNGS:  Clear to auscultation bilaterally HEART:  RRR.  PMI not displaced or sustained,S1 and S2 within normal limits, no S3, no S4, no clicks, no rubs, no murmurs ABD:  Flat, positive bowel sounds normal in frequency in pitch, no bruits, no rebound, no guarding, no midline pulsatile mass, no hepatomegaly, no splenomegaly EXT:  2 plus pulses throughout, no edema, no cyanosis no clubbing SKIN:  No rashes no nodules NEURO:  Cranial nerves II through XII grossly intact, motor grossly intact throughout PSYCH:  Cognitively intact, oriented to person place and time   ASSESSMENT/PLAN:    HTN -BP not at goal less than 130/80.  Stop amlodipine, stop losartan.  Start amlodipine-valsartan 10-320 mg daily.  Repeat BMP in 2 weeks.  Discussed to monitor BP at home at least 2 hours after medications and sitting for 5-10 minutes.  She will bring BP cuff to next office visit to ensure accuracy.  Continue carvedilol 25 mg twice daily, clonidine 0.1 mg twice daily.  Plan to consolidate her medications is much as possible and may benefit from clonidine patch in the future for easier dosing. Renin aldosterone and TSH today to rule out secondary hypertension. Renal duplex to rule out renal artery stenosis contributory to hypertension.  Housing instability - Her rent is cost prohibitive.  Lives in which she was told was rent control department however she gets [phone] for disability and her rent is $1209. Will refer to SW team for assistance  Aortic atherosclerosis/hyperlipidemia - Stable with no anginal symptoms. No indication for ischemic  evaluation.  Continue atorvastatin.  Update direct LDL, CMP.   LE edema -echo 10/2021 normal LVEF.  Nonpitting on exam.  Likely element of venous insufficiency as she is predominately sits with legs in the dependent position.  Continue as needed Lasix.  OSA -she stopped using  CPAP during time there was a recall though she is not certain her specific machine was the one recalled.  She will inform us which CPAP machine she has and we will assess whether to resume using old machine or if she needs new machine.  Obesity - Weight loss via diet and exercise encouraged. Discussed the impact being overweight would have on cardiovascular risk.  Exercise limited related to back issues.  Discussed needing to focus on weight loss through dietary changes.  Screening for Secondary Hypertension:     11/21/2022    1:02 PM  Causes  Renovascular HTN Screened     - Comments 11/2022 renal duplex ordered.  Sleep Apnea Screened     - Comments Encouraged to utilize CPAP.  Thyroid Disease Screened  Hyperaldosteronism Screened     - Comments 11/2022 renal -aldosterone ordered.  Pheochromocytoma Screened     - Comments Prior CT normal adrenal glands.  Coarctation of the Aorta Screened     - Comments BP symmetrical.  Compliance Screened    Relevant Labs/Studies:    Latest Ref Rng & Units 03/22/2021    7:25 PM 07/25/2020    1:51 PM  Basic Labs  Sodium 135 - 145 mmol/L 134  135   Potassium 3.5 - 5.1 mmol/L 4.0  4.6   Creatinine 0.44 - 1.00 mg/dL 1.61  0.96                    11/21/2022    9:42 AM  Renovascular   Renal Artery [place] Completed Yes     Disposition:    FU with MD/PharmD in 4-6 weeks    Medication Adjustments/Labs and Tests Ordered: Current medicines are reviewed at length with the patient today.  Concerns regarding medicines are outlined above.  Orders Placed This Encounter  Procedures   TSH   Aldosterone + renin activity w/ ratio   Comp Met (CMET)   Direct LDL   Basic Metabolic Panel  (BMET)   Referral to HRT/VAS Care Navigation   EKG 12-Lead   VAS US RENAL ARTERY DUPLEX   Meds ordered this encounter  Medications   amLODipine-valsartan (EXFORGE) 10-320 MG tablet    Sig: Take 1 tablet by mouth daily.    Dispense:  30 tablet    Refill:  2    Order Specific Question:   Supervising Provider    Answer:   Jodelle Red [[id number]]     Signed, Alver Sorrow, NP  11/21/2022 1:03 PM    [facility]

## 2022-11-21 NOTE — Telephone Encounter (Signed)
CSW called pt to follow up regarding concerns with affording rent- unable to reach- left VM requesting return call  Burna Sis, LCSW Clinical Social Worker [facility] Desk#: [phone] Cell#: [phone]

## 2022-11-22 ENCOUNTER — Telehealth (HOSPITAL_COMMUNITY): Payer: Self-pay | Admitting: Licensed Clinical Social Worker

## 2022-11-22 NOTE — Telephone Encounter (Unsigned)
H&V Care Navigation CSW Progress Note  Clinical Social Worker spoke with Gabriela Guerrero regarding current financial concerns.  Gabriela Guerrero confirms that she struggles to pay for basics.  States she gets $1,342 for disability and pays $1,209 in rent- this was increase from $950 that occurred in January.  Gabriela Guerrero thinks she is in income based housing but doesn't sound as if she is- CSW attempted to call apartment to discuss but unable to reach anyone.  Gabriela Guerrero reports she gets some assistance from a card through her insurance for $325/month as well as some food stamps.  It is still a strain to afford housing.  Dtr is living with her at this time but reports they never required income from her so don't think this is a factor.  Gabriela Guerrero reports she has applied for Section 8 but hasn't looked at other income based housing.  CSW sent Gabriela Guerrero list of income based housing options to review.  Gabriela Guerrero struggling with depression- engaging in therapy.  Feeling very isolated and has mobility concerns to get around so not leaving the house.  Has meals on wheels and home services coming out and this helps.    Encouraged Gabriela Guerrero to reach out for further assistance.  SDOH Screenings   Food Insecurity: No Food Insecurity (10/28/2022)   Received from [facility]  Recent Concern: Food Insecurity - Food Insecurity Present (09/20/2022)   Received from [facility], [facility]  Housing: Medium Risk (11/22/2022)  Transportation Needs: No Transportation Needs (11/21/2022)  Utilities: Not At Risk (11/21/2022)  Recent Concern: Utilities - At Risk (09/20/2022)   Received from [facility], [facility]  Alcohol Screen: Low Risk  (11/21/2022)  Depression (PHQ2-9): High Risk (01/02/2022)  Financial Resource Strain: High Risk (11/22/2022)  Physical Activity: Unknown (02/22/2022)   Received from [facility], [facility]  Social Connections: Somewhat Isolated (02/22/2022)   Received from [facility], [facility]  Stress: Stress Concern Present (09/20/2022)   Received  from [facility], [facility]  Tobacco Use: Low Risk  (11/21/2022)   Burna Sis, LCSW Clinical Social Worker [facility] Desk#: [phone] Cell#: [phone]

## 2022-11-23 DIAGNOSIS — E1165 Type 2 diabetes mellitus with hyperglycemia: Secondary | ICD-10-CM | POA: Diagnosis not present

## 2022-11-23 DIAGNOSIS — E1142 Type 2 diabetes mellitus with diabetic polyneuropathy: Secondary | ICD-10-CM | POA: Diagnosis not present

## 2022-11-23 DIAGNOSIS — N1831 Chronic kidney disease, stage 3a: Secondary | ICD-10-CM | POA: Diagnosis not present

## 2022-11-23 DIAGNOSIS — I152 Hypertension secondary to endocrine disorders: Secondary | ICD-10-CM | POA: Diagnosis not present

## 2022-11-23 DIAGNOSIS — M4316 Spondylolisthesis, lumbar region: Secondary | ICD-10-CM | POA: Diagnosis not present

## 2022-11-23 DIAGNOSIS — E1159 Type 2 diabetes mellitus with other circulatory complications: Secondary | ICD-10-CM | POA: Diagnosis not present

## 2022-11-23 DIAGNOSIS — K529 Noninfective gastroenteritis and colitis, unspecified: Secondary | ICD-10-CM | POA: Diagnosis not present

## 2022-11-23 DIAGNOSIS — E1122 Type 2 diabetes mellitus with diabetic chronic kidney disease: Secondary | ICD-10-CM | POA: Diagnosis not present

## 2022-11-23 DIAGNOSIS — M48062 Spinal stenosis, lumbar region with neurogenic claudication: Secondary | ICD-10-CM | POA: Diagnosis not present

## 2022-11-29 DIAGNOSIS — E1122 Type 2 diabetes mellitus with diabetic chronic kidney disease: Secondary | ICD-10-CM | POA: Diagnosis not present

## 2022-11-29 DIAGNOSIS — M48062 Spinal stenosis, lumbar region with neurogenic claudication: Secondary | ICD-10-CM | POA: Diagnosis not present

## 2022-11-29 DIAGNOSIS — E1165 Type 2 diabetes mellitus with hyperglycemia: Secondary | ICD-10-CM | POA: Diagnosis not present

## 2022-11-29 DIAGNOSIS — I152 Hypertension secondary to endocrine disorders: Secondary | ICD-10-CM | POA: Diagnosis not present

## 2022-11-29 DIAGNOSIS — M4316 Spondylolisthesis, lumbar region: Secondary | ICD-10-CM | POA: Diagnosis not present

## 2022-11-29 DIAGNOSIS — N1831 Chronic kidney disease, stage 3a: Secondary | ICD-10-CM | POA: Diagnosis not present

## 2022-11-29 DIAGNOSIS — E1142 Type 2 diabetes mellitus with diabetic polyneuropathy: Secondary | ICD-10-CM | POA: Diagnosis not present

## 2022-11-29 DIAGNOSIS — K529 Noninfective gastroenteritis and colitis, unspecified: Secondary | ICD-10-CM | POA: Diagnosis not present

## 2022-11-29 DIAGNOSIS — E1159 Type 2 diabetes mellitus with other circulatory complications: Secondary | ICD-10-CM | POA: Diagnosis not present

## 2022-12-02 ENCOUNTER — Telehealth (HOSPITAL_BASED_OUTPATIENT_CLINIC_OR_DEPARTMENT_OTHER): Payer: Self-pay

## 2022-12-02 NOTE — Telephone Encounter (Addendum)
Left message for patient to call back    ----- Message from Alver Sorrow sent at 12/01/2022  6:39 PM EDT ----- Stable kidney function. Normal liver enzymes, thyroid, renin-aldosterone. This is great!  Direct LDL not at goal of <70. Increase Atorvastatin to 20mg  daily. Repeat FLP/CMP in 2 months.   Can we please get updated BP readings from her?

## 2022-12-03 NOTE — Telephone Encounter (Signed)
2nd call attempt, no answer, Left message for patient to call back        ----- Message from Alver Sorrow sent at 12/01/2022  6:39 PM EDT ----- Stable kidney function. Normal liver enzymes, thyroid, renin-aldosterone. This is great!   Direct LDL not at goal of <70. Increase Atorvastatin to 20mg  daily. Repeat FLP/CMP in 2 months.    Can we please get updated BP readings from her?

## 2022-12-04 ENCOUNTER — Telehealth: Payer: Self-pay | Admitting: Family

## 2022-12-04 DIAGNOSIS — E1165 Type 2 diabetes mellitus with hyperglycemia: Secondary | ICD-10-CM

## 2022-12-04 DIAGNOSIS — E1159 Type 2 diabetes mellitus with other circulatory complications: Secondary | ICD-10-CM

## 2022-12-04 DIAGNOSIS — E782 Mixed hyperlipidemia: Secondary | ICD-10-CM

## 2022-12-04 MED ORDER — ATORVASTATIN CALCIUM 20 MG PO TABS
20.0000 mg | ORAL_TABLET | Freq: Every day | ORAL | 1 refills | Status: DC
Start: 2022-12-04 — End: 2022-12-12

## 2022-12-04 NOTE — Telephone Encounter (Signed)
Returned call to patient with lab results.  Explanation of results provided.  Pt verbalized understanding of instructions as given.  Requests hold on sending new Atorvastatin dose to pharmacy since she just received a refill of 10 mg Atorvastatin, she will take 2 at bedtime. Jim Like MHA RN CCM

## 2022-12-04 NOTE — Telephone Encounter (Signed)
Patient is returning phone call in regards to lab results.

## 2022-12-11 DIAGNOSIS — Z981 Arthrodesis status: Secondary | ICD-10-CM | POA: Diagnosis not present

## 2022-12-11 DIAGNOSIS — E1322 Other specified diabetes mellitus with diabetic chronic kidney disease: Secondary | ICD-10-CM | POA: Diagnosis not present

## 2022-12-11 DIAGNOSIS — M542 Cervicalgia: Secondary | ICD-10-CM | POA: Diagnosis not present

## 2022-12-11 DIAGNOSIS — E1159 Type 2 diabetes mellitus with other circulatory complications: Secondary | ICD-10-CM | POA: Diagnosis not present

## 2022-12-11 DIAGNOSIS — E1365 Other specified diabetes mellitus with hyperglycemia: Secondary | ICD-10-CM | POA: Diagnosis not present

## 2022-12-11 DIAGNOSIS — M4186 Other forms of scoliosis, lumbar region: Secondary | ICD-10-CM | POA: Diagnosis not present

## 2022-12-11 DIAGNOSIS — W010XXA Fall on same level from slipping, tripping and stumbling without subsequent striking against object, initial encounter: Secondary | ICD-10-CM | POA: Diagnosis not present

## 2022-12-11 DIAGNOSIS — M549 Dorsalgia, unspecified: Secondary | ICD-10-CM | POA: Diagnosis not present

## 2022-12-11 DIAGNOSIS — M47814 Spondylosis without myelopathy or radiculopathy, thoracic region: Secondary | ICD-10-CM | POA: Diagnosis not present

## 2022-12-11 DIAGNOSIS — R6889 Other general symptoms and signs: Secondary | ICD-10-CM | POA: Diagnosis not present

## 2022-12-11 DIAGNOSIS — E782 Mixed hyperlipidemia: Secondary | ICD-10-CM | POA: Diagnosis not present

## 2022-12-11 DIAGNOSIS — I129 Hypertensive chronic kidney disease with stage 1 through stage 4 chronic kidney disease, or unspecified chronic kidney disease: Secondary | ICD-10-CM | POA: Diagnosis not present

## 2022-12-11 DIAGNOSIS — Z7985 Long-term (current) use of injectable non-insulin antidiabetic drugs: Secondary | ICD-10-CM | POA: Diagnosis not present

## 2022-12-11 DIAGNOSIS — M47816 Spondylosis without myelopathy or radiculopathy, lumbar region: Secondary | ICD-10-CM | POA: Diagnosis not present

## 2022-12-11 DIAGNOSIS — M47812 Spondylosis without myelopathy or radiculopathy, cervical region: Secondary | ICD-10-CM | POA: Diagnosis not present

## 2022-12-11 DIAGNOSIS — N1831 Chronic kidney disease, stage 3a: Secondary | ICD-10-CM | POA: Diagnosis not present

## 2022-12-11 DIAGNOSIS — M5030 Other cervical disc degeneration, unspecified cervical region: Secondary | ICD-10-CM | POA: Diagnosis not present

## 2022-12-11 DIAGNOSIS — R519 Headache, unspecified: Secondary | ICD-10-CM | POA: Diagnosis not present

## 2022-12-11 DIAGNOSIS — I152 Hypertension secondary to endocrine disorders: Secondary | ICD-10-CM | POA: Diagnosis not present

## 2022-12-12 ENCOUNTER — Telehealth (HOSPITAL_BASED_OUTPATIENT_CLINIC_OR_DEPARTMENT_OTHER): Payer: Self-pay

## 2022-12-12 DIAGNOSIS — E782 Mixed hyperlipidemia: Secondary | ICD-10-CM

## 2022-12-12 MED ORDER — ATORVASTATIN CALCIUM 40 MG PO TABS: 40.0000 mg | ORAL_TABLET | Freq: Every day | ORAL | 3 refills | Status: DC

## 2022-12-12 NOTE — Telephone Encounter (Addendum)
Results called to patient who verbalizes understanding! Labs ordered and mailed, prescriptions updated and sent to pharmacy on file.     ----- Message from Alver Sorrow sent at 12/12/2022  7:40 AM EDT ----- Stable kidney function.  Direct LDL of 79 which is not at goal <70, increase Atorvastatin to 40mg  daily.  Normal thyroid, liver function.  Repeat FLP/LFT in 2-3 months

## 2022-12-13 ENCOUNTER — Telehealth: Payer: Self-pay | Admitting: Family Medicine

## 2022-12-13 DIAGNOSIS — E1142 Type 2 diabetes mellitus with diabetic polyneuropathy: Secondary | ICD-10-CM | POA: Diagnosis not present

## 2022-12-13 DIAGNOSIS — E1122 Type 2 diabetes mellitus with diabetic chronic kidney disease: Secondary | ICD-10-CM | POA: Diagnosis not present

## 2022-12-13 DIAGNOSIS — I152 Hypertension secondary to endocrine disorders: Secondary | ICD-10-CM | POA: Diagnosis not present

## 2022-12-13 DIAGNOSIS — E1165 Type 2 diabetes mellitus with hyperglycemia: Secondary | ICD-10-CM | POA: Diagnosis not present

## 2022-12-13 DIAGNOSIS — M4316 Spondylolisthesis, lumbar region: Secondary | ICD-10-CM | POA: Diagnosis not present

## 2022-12-13 DIAGNOSIS — N1831 Chronic kidney disease, stage 3a: Secondary | ICD-10-CM | POA: Diagnosis not present

## 2022-12-13 DIAGNOSIS — M48062 Spinal stenosis, lumbar region with neurogenic claudication: Secondary | ICD-10-CM | POA: Diagnosis not present

## 2022-12-13 DIAGNOSIS — E1159 Type 2 diabetes mellitus with other circulatory complications: Secondary | ICD-10-CM | POA: Diagnosis not present

## 2022-12-13 DIAGNOSIS — K529 Noninfective gastroenteritis and colitis, unspecified: Secondary | ICD-10-CM | POA: Diagnosis not present

## 2022-12-13 NOTE — Telephone Encounter (Signed)
Delaney Meigs, Physical Therapist with [facility] called. Ms Kilburg fell on Wednesday and had to go to the ER(she had to report this because she is her Physical Therapist).

## 2022-12-17 IMAGING — CT CT ABD-PELV W/ CM
2 of 5 series · 16 of 46 positions shown, 18 images · IV contrast (APPLIED)
Comparison: None.

CLINICAL DATA: Right lower quadrant pain CVA tenderness

EXAM:
CT ABDOMEN AND PELVIS WITH CONTRAST
TECHNIQUE: Multidetector CT imaging of the abdomen and pelvis was performed
using the standard protocol following bolus administration of
intravenous contrast.
CONTRAST:  100mL OMNIPAQUE IOHEXOL 300 MG/ML  SOLN

[Series 3: abdomen 5.0 (person_name) · axial · 0.97mm/px · z∈[-600,-200]mm · 13 of 94 slices shown, 15 images]
[im 7/94  soft-tissue]
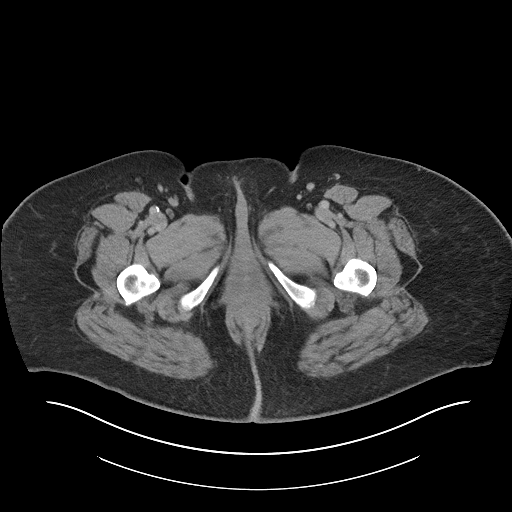
[im 7/94  bone]
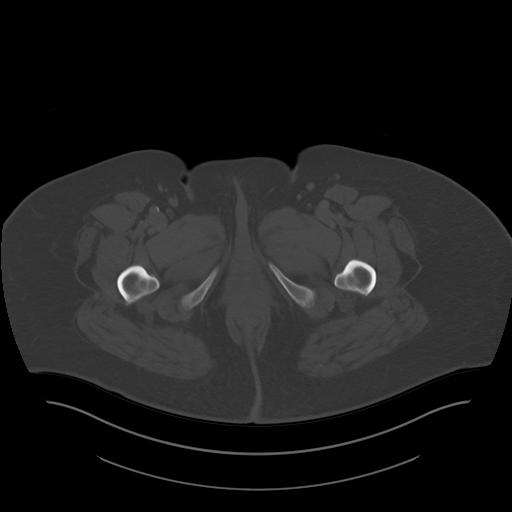
[im 13/94  soft-tissue]
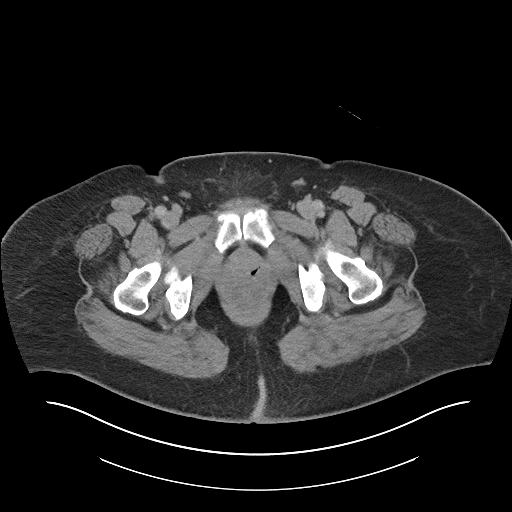
[im 19/94  soft-tissue]
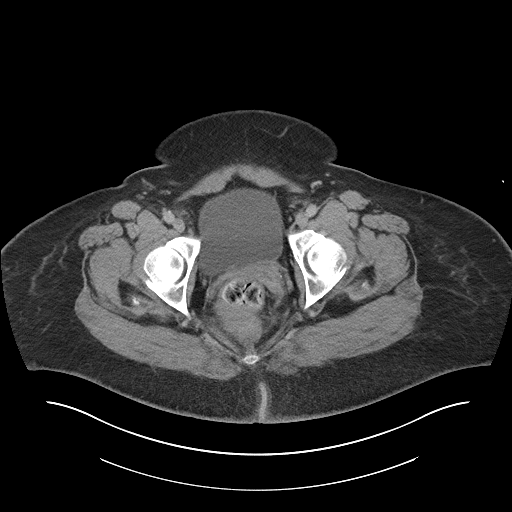
[im 25/94  soft-tissue]
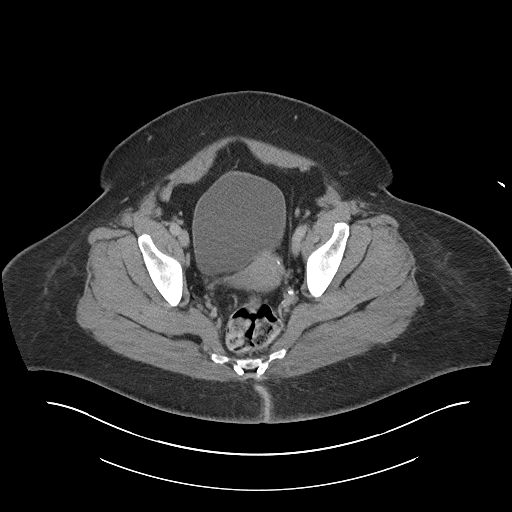
[im 32/94  soft-tissue]
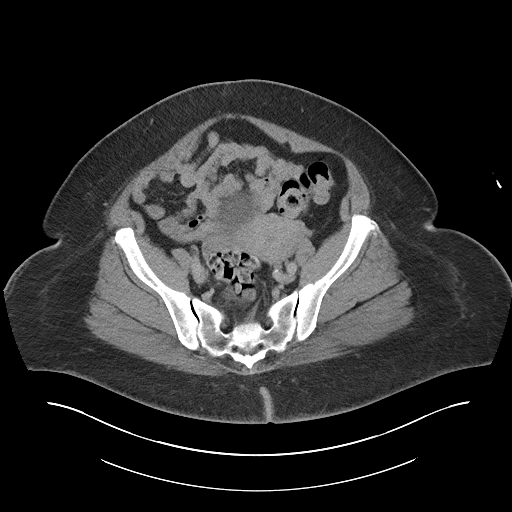
[im 38/94  soft-tissue]
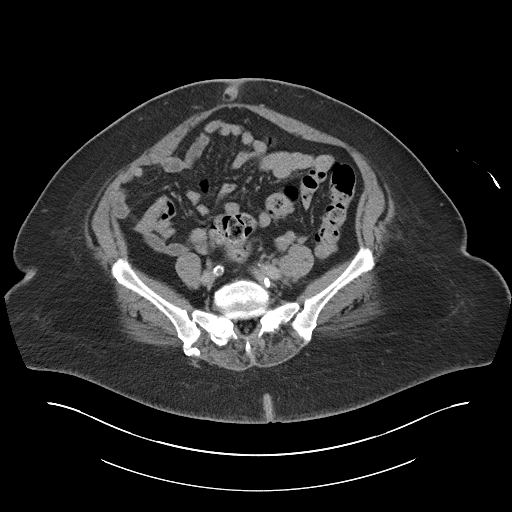
[im 50/94  soft-tissue]
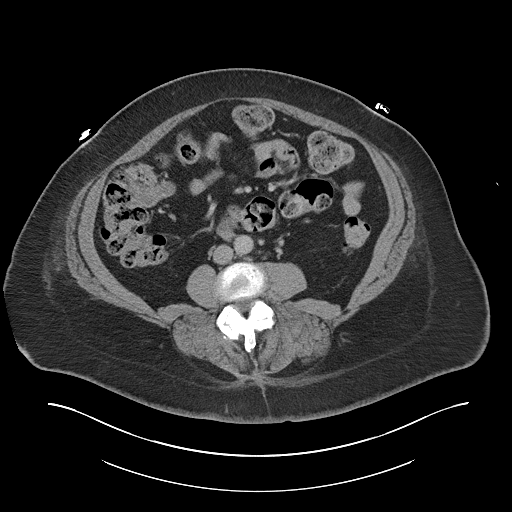
[im 56/94  soft-tissue]
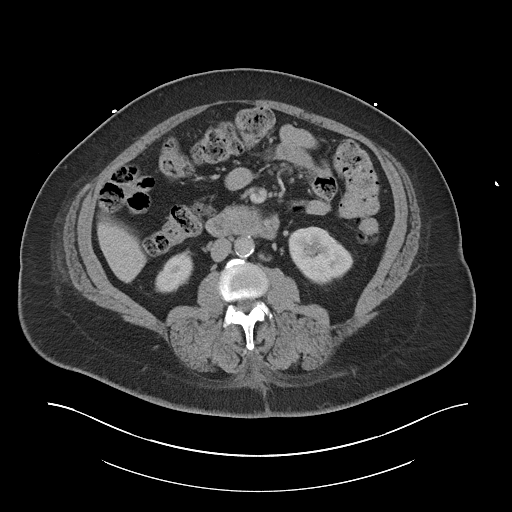
[im 63/94  soft-tissue]
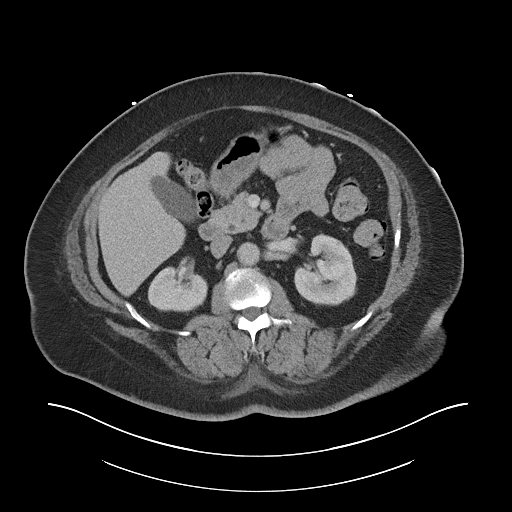
[im 63/94  bone]
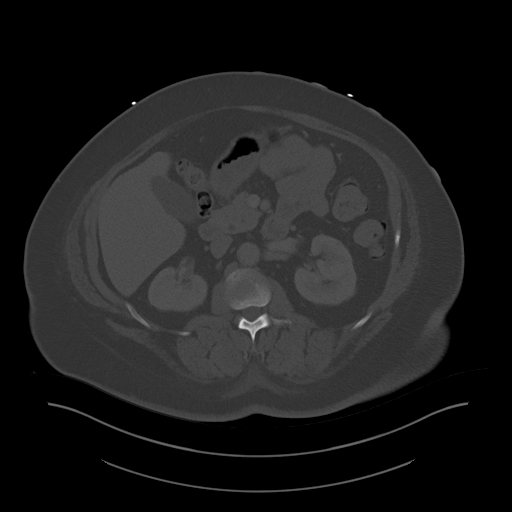
[im 69/94  soft-tissue]
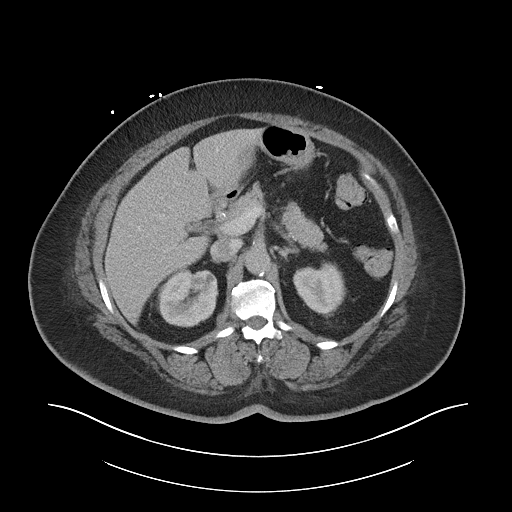
[im 75/94  soft-tissue]
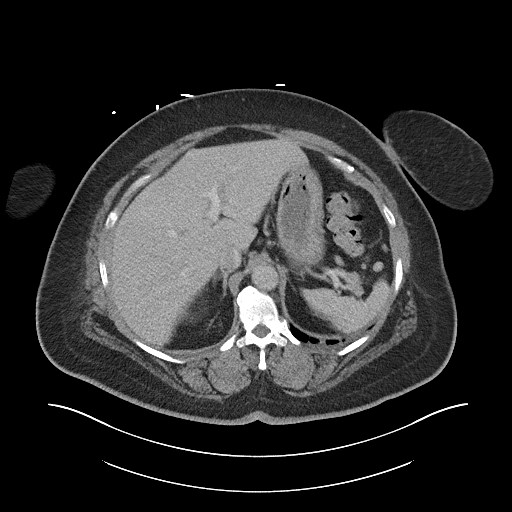
[im 81/94  soft-tissue]
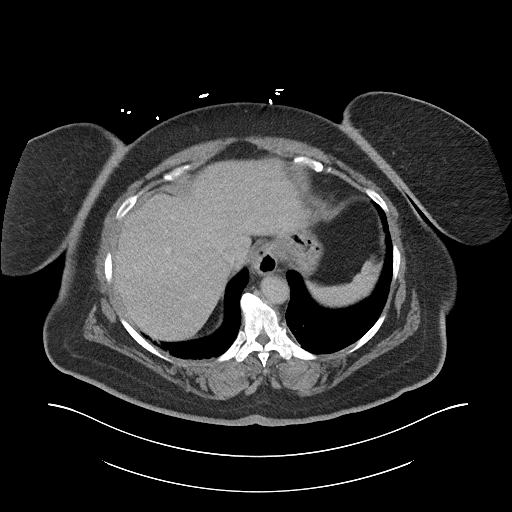
[im 87/94  soft-tissue]
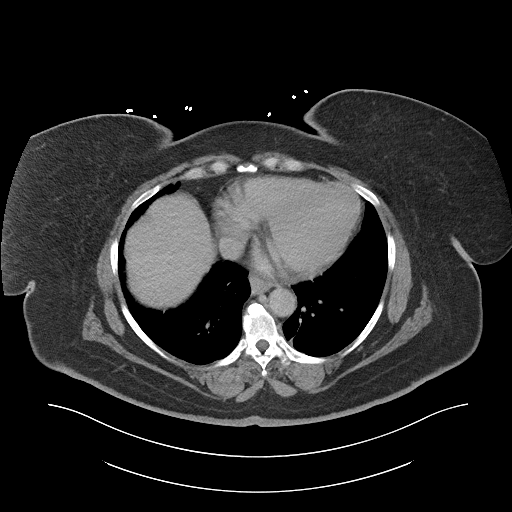

[Series 6: abdomen 3.0 (person_name) · coronal · 0.90mm/px · 3 of 122 slices shown]
[im 41/122  soft-tissue]
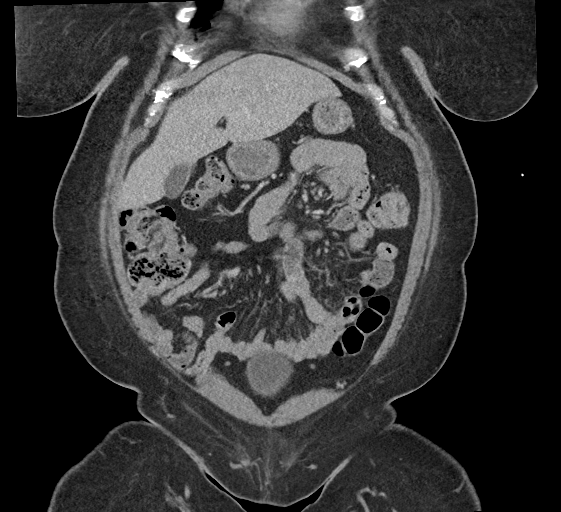
[im 54/122  soft-tissue]
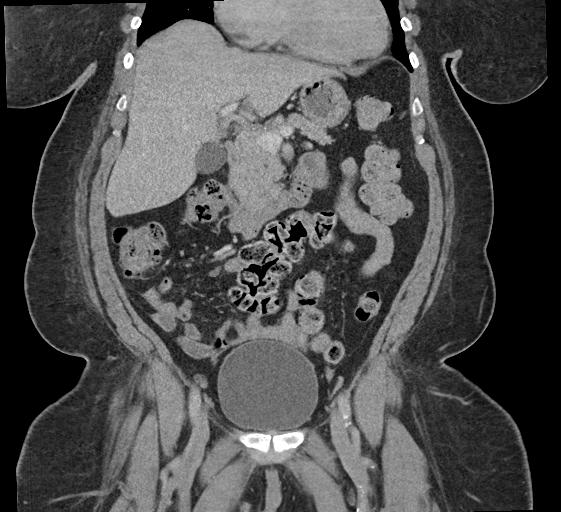
[im 68/122  soft-tissue]
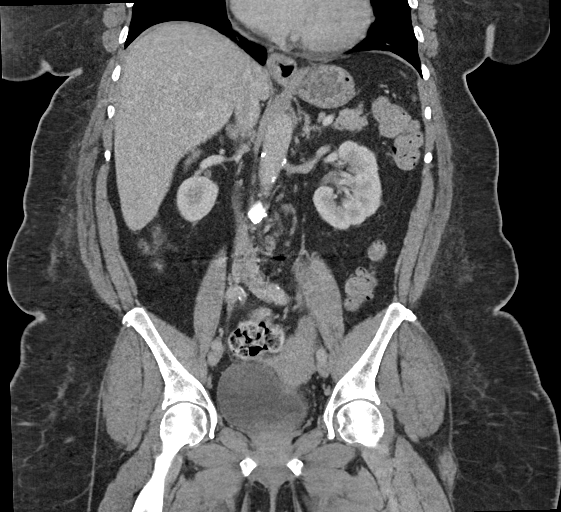

[16 of 46 positions shown; findings below may reference images not displayed]

FINDINGS: Lower chest: The visualized heart size within normal limits. No
pericardial fluid/thickening.

A small hiatal hernia is present.

The visualized portions of the lungs are clear.

Hepatobiliary: There is diffuse low density seen throughout the
liver parenchyma.The main portal vein is patent. No evidence of
calcified gallstones, gallbladder wall thickening or biliary
dilatation.

Pancreas: Unremarkable. No pancreatic ductal dilatation or
surrounding inflammatory changes.

Spleen: Normal in size without focal abnormality.

Adrenals/Urinary Tract: Both adrenal glands appear normal. The
kidneys and collecting system appear normal without evidence of
urinary tract calculus or hydronephrosis. There is a tiny 1 cm
low-density lesion seen in the upper pole of the left kidney.
Bladder is unremarkable.

Stomach/Bowel: The stomach, small bowel, are normal in appearance. A
moderate amount of colonic stool is present throughout. There
appears to be question of mild wall thickening seen around the
rectum with mild presacral edema.

Vascular/Lymphatic: There are no enlarged mesenteric,
retroperitoneal, or pelvic lymph nodes. Scattered aortic
atherosclerotic calcifications are seen without aneurysmal
dilatation.

Reproductive: The uterus and adnexa are unremarkable.

Other: No evidence of abdominal wall mass or hernia.

Musculoskeletal: No acute or significant osseous findings. Final
fixation hardware seen at L4-L5.
IMPRESSION: Moderate amount of colonic stool with findings that could be
suggestive of mild proctocolitis

Aortic Atherosclerosis ([id number]).

Small hiatal hernia

## 2022-12-19 NOTE — Progress Notes (Signed)
Advanced Hypertension Clinic Assessment:    Date:  12/20/2022   ID:  Gabriela Guerrero, DOB [birth year], MRN [id number]  PCP:  Everrett Coombe, DO  Cardiologist:  None  Nephrologist:  Referring MD: Everrett Coombe, DO   CC: Hypertension  History of Present Illness:    Gabriela Guerrero is a 62 y.o. female with a hx of hypertension, obesity, aortic atherosclerosis, HLD, DM2, OSA, neuropathy, prior back surgery here to follow up in the Advanced Hypertension Clinic.   Prior echo 10/24/21 normal LVEF 65-70%, moderate diastolic dysfunction, LA mildly dilated, mild MR/TR.  Prior CT 07/2020 normal adrenal glands, aortic atherosclerosis.  Established with Advanced Hypertension Clinic 11/21/22. Gabriela Guerrero was diagnosed with hypertension in her 31s. No tobacco use. Exercise tolerance limited by prior failed back surgery.Getting meals through [employer]. Amlodipine and Losartan discontinued, she was started on Amlodipine-Valsartan 10-320mg  daily. Coreg 25mg  BID, Clonidine 0.1mg  BID continued. Stressors noted related to housing, SW consulted and gave resources for income based housing. TSH normal. Direct LDL of 79, Atorvastatin increased to 40mg  daily.   ED visit at [facility] 12/11/22 after falling on cement and hitting head on car. X-ray with no fracture no dislocation.   Presents today for follow up. Renal duplex performed earlier today, results not yet available. 11/21/22 aldosteron 11.8, renin 0.442, ratio 26.7 not consistent with hyperaldosteronism. 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 Does note she felt lightheaded, dizzy prior to her fall after I inquired about symptoms. She has lost 5 lbs since last office visit. She does not think she is taking Clonidine at this time. She is taking the Carvedilol.   Previous antihypertensives: Losartan-hydrochlorothiazide  Past Medical History:  Diagnosis Date   Hypertension     Past Surgical History:  Procedure Laterality Date    LUMBAR FUSION      Current Medications: Current Meds  Medication Sig   acetaminophen (TYLENOL) 650 MG CR tablet Take by mouth.   albuterol (VENTOLIN HFA) 108 (90 Base) MCG/ACT inhaler INHALE 2 PUFFS BY MOUTH EVERY 4 TO 6 HOURS AS NEEDED FOR WHEEZING   Alcohol Swabs (DROPSAFE ALCOHOL PREP) 70 % PADS Apply topically.   aspirin 81 MG EC tablet Take by mouth.   atorvastatin (LIPITOR) 40 MG tablet Take 1 tablet (40 mg total) by mouth daily.   butalbital-acetaminophen-caffeine (FIORICET) 50-325-40 MG tablet Take 1 tablet by mouth every 6 (six) hours as needed for headache.   carvedilol (COREG) 25 MG tablet Take 1 tablet (25 mg total) by mouth 2 (two) times daily.   cloNIDine (CATAPRES - DOSED IN MG/24 HR) 0.1 mg/24hr patch Place 1 patch (0.1 mg total) onto the skin once a week.   diphenhydrAMINE (BENADRYL) 25 mg capsule Take by mouth.   furosemide (LASIX) 20 MG tablet Take 1 tablet (20 mg total) by mouth daily as needed.   glucose blood test strip Check sugars twice daily - pre and post prandially   LORazepam (ATIVAN) 0.5 MG tablet Take 1 tablet by mouth twice daily as needed   methocarbamol (ROBAXIN) 750 MG tablet TAKE 1 TABLET THREE TIMES DAILY AS NEEDED   mometasone-formoterol (DULERA) 100-5 MCG/ACT AERO Inhale 2 puffs into the lungs 2 (two) times daily.   nortriptyline (PAMELOR) 10 MG capsule Take 10 mg by mouth at bedtime.   NOVOLOG FLEXPEN 100 UNIT/ML FlexPen Inject into the skin.   nystatin (MYCOSTATIN/NYSTOP) powder Apply 1 application topically 3 (three) times daily.   omeprazole (PRILOSEC) 40 MG capsule Take 1  capsule (40 mg total) by mouth daily.   ondansetron (ZOFRAN-ODT) 4 MG disintegrating tablet DISSOLVE 1 TABLET ON THE TONGUE EVERY 8 HOURS AS NEEDED FOR NAUSEA AND VOMITING   OZEMPIC, 0.25 OR 0.5 MG/DOSE, 2 MG/1.5ML SOPN Inject 0.5 mg into the skin once a week.   potassium chloride (KLOR-CON) 10 MEQ tablet Take 1 tablet (10 mEq total) by mouth daily.   pregabalin (LYRICA) 100 MG  capsule Take 1 capsule (100 mg total) by mouth 2 (two) times daily.   sertraline (ZOLOFT) 100 MG tablet Take 1.5 tablets by mouth daily.   SUMAtriptan (IMITREX) 50 MG tablet Take 50 mg by mouth every 2 (two) hours as needed for migraine. May repeat in 2 hours if headache persists or recurs.   TOUJEO MAX SOLOSTAR 300 UNIT/ML Solostar Pen SMARTSIG:60 Unit(s) SUB-Q Daily   traMADol (ULTRAM) 50 MG tablet Take 1-2 tablets (50-100 mg total) by mouth every 6 (six) hours as needed.   [DISCONTINUED] amLODipine-valsartan (EXFORGE) 10-320 MG tablet Take 1 tablet by mouth daily.   [DISCONTINUED] cloNIDine (CATAPRES) 0.1 MG tablet TAKE 1 TABLET TWICE DAILY     Allergies:   Patient has no known allergies.   Social History   Socioeconomic History   Marital status: Single    Spouse name: Not on file   Number of children: Not on file   Years of education: Not on file   Highest education level: Not on file  Occupational History   Occupation: Disabled  Tobacco Use   Smoking status: Never   Smokeless tobacco: Never  Vaping Use   Vaping status: Never Used  Substance and Sexual Activity   Alcohol use: Never   Drug use: Never   Sexual activity: Not Currently    Partners: Male    Birth control/protection: Abstinence  Other Topics Concern   Not on file  Social History Narrative   Not on file   Social Determinants of Health   Financial Resource Strain: High Risk (11/22/2022)   Overall Financial Resource Strain (CARDIA)    Difficulty of Paying Living Expenses: Hard  Food Insecurity: No Food Insecurity (10/28/2022)   Received from [facility]   Hunger Vital Sign    Worried About Running Out of Food in the Last Year: Never true    Ran Out of Food in the Last Year: Never true  Recent Concern: Food Insecurity - Food Insecurity Present (09/20/2022)   Received from [facility], [facility]   Hunger Vital Sign    Worried About Running Out of Food in the Last Year: Sometimes true    Ran Out of Food  in the Last Year: Never true  Transportation Needs: No Transportation Needs (11/21/2022)   PRAPARE - Administrator, Civil Service (Medical): No    Lack of Transportation (Non-Medical): No  Physical Activity: Unknown (02/22/2022)   Received from [facility], [facility]   Exercise Vital Sign    Days of Exercise per Week: 1 day    Minutes of Exercise per Session: Not on file  Stress: Stress Concern Present (09/20/2022)   Received from [facility], [facility] of Occupational Health - Occupational Stress Questionnaire    Feeling of Stress : Very much  Social Connections: Somewhat Isolated (02/22/2022)   [facility]   Social Network    How would you rate your social network (family, work, friends)?: Restricted participation with some degree of social isolation     Family History:  The patient's family history includes Breast cancer in her mother; Hypertension in her mother and sister; Stroke in her sister.  ROS:   Please see the history of present illness.    All other systems reviewed and are negative.  EKGs/Labs/Other Studies Reviewed:         Recent Labs: 12/11/2022: ALT 16; BUN 15; Creatinine, Ser 1.33; Potassium 4.7; Sodium 136; TSH 3.370   Recent Lipid Panel    Component Value Date/Time   LDLDIRECT 79 12/11/2022 0858    Physical Exam:   VS:  BP (!) 152/80   Pulse 74   Ht 5\' 2"  (1.575 m)   Wt 265 lb (120.2 kg)   BMI 48.47 kg/m  , BMI Body mass index is 48.47 kg/m. GENERAL:  Well appearing HEENT: Pupils equal round and reactive, fundi not visualized, oral mucosa unremarkable NECK:  No jugular venous distention, waveform within normal limits, carotid upstroke brisk and symmetric, no bruits, no thyromegaly LYMPHATICS:  No cervical adenopathy LUNGS:  Clear to auscultation bilaterally HEART:  RRR.  PMI not displaced or sustained,S1 and S2 within normal limits, no S3, no S4, no clicks, no rubs, no  murmurs ABD:  Flat, positive bowel sounds normal in frequency in pitch, no bruits, no rebound, no guarding, no midline pulsatile mass, no hepatomegaly, no splenomegaly EXT:  2 plus pulses throughout, no edema, no cyanosis no clubbing SKIN:  No rashes no nodules NEURO:  Cranial nerves II through XII grossly intact, motor grossly intact throughout PSYCH:  Cognitively intact, oriented to person place and time   ASSESSMENT/PLAN:    HTN -BP not at goal less than 130/80.  Continue Coreg 25mg  BID. Amlodipine-valsartan 10-320 mg daily.  She does not think she is taking Clonidine at this time. For simplification of regimen, stop Clonidine tablet and start Clonidine 0.1mg  patch weekly. Can further up-titrate as needed. Discussed to monitor BP at home at least 2 hours after medications and sitting for 5-10 minutes.  Asked her to bring BP cuff to next office visit to ensure accuracy.  Renin aldosterone with no hyperaldosteronism and normal TSH today  Renal duplex performed today with results not yet available.  Housing instability - Her rent is cost prohibitive. Has been provided information from SW team for housing assistance. Recently re-signed for EBT. We provided handout with info for [facility], encouraged to call to discuss cheaper housing options.   Aortic atherosclerosis/hyperlipidemia - Stable with no anginal symptoms. No indication for ischemic evaluation.  Continue atorvastatin 40mg  daily which was increased 12/11/22 due to LDL 79. Repeat FLP/LFT already ordered.  LE edema -echo 10/2021 normal LVEF.  Nonpitting on exam.  Likely element of venous insufficiency as she is predominately sits with legs in the dependent position.  Continue as needed Lasix.  OSA -she stopped using CPAP during time there was a recall though she is not certain her specific machine was the one recalled.  Asked her to bring her CPAP to her next OV so we can see which it is to know whether it was one of the  ones recalled.   Obesity - Weight loss via diet and exercise encouraged. Discussed the impact being overweight would have on cardiovascular risk.  Exercise limited related to back issues.  Discussed needing to focus on weight loss through dietary changes.  Screening for Secondary Hypertension:     11/21/2022    1:02 PM  Causes  Renovascular HTN Screened     - Comments 11/2022 renal duplex ordered.  Sleep Apnea Screened     - Comments Encouraged to utilize CPAP.  Thyroid Disease Screened  Hyperaldosteronism Screened     - Comments 11/2022 renal -aldosterone ordered.  Pheochromocytoma Screened     - Comments Prior CT normal adrenal glands.  Coarctation of the Aorta Screened     - Comments BP symmetrical.  Compliance Screened    Relevant Labs/Studies:    Latest Ref Rng & Units 12/11/2022    8:58 AM 11/21/2022    9:57 AM 03/22/2021    7:25 PM  Basic Labs  Sodium 134 - 144 mmol/L 136  137  134   Potassium 3.5 - 5.2 mmol/L 4.7  4.7  4.0   Creatinine 0.57 - 1.00 mg/dL 1.61  0.96  0.45        Latest Ref Rng & Units 12/11/2022    8:58 AM 11/21/2022    9:57 AM  Thyroid   TSH 0.450 - 4.500 uIU/mL 3.370  3.370        Latest Ref Rng & Units 12/11/2022    8:58 AM 11/21/2022    9:57 AM  Renin/Aldosterone   Aldosterone 0.0 - 30.0 ng/dL 40.9  81.1   Aldos/Renin Ratio  WILL FOLLOW  P 26.7     P Preliminary result             12/20/2022   10:38 AM  Renovascular   Renal Artery [place] Completed Yes     Disposition:    FU with MD/PharmD in 2 months   Medication Adjustments/Labs and Tests Ordered: Current medicines are reviewed at length with the patient today.  Concerns regarding medicines are outlined above.  No orders of the defined types were placed in this encounter.  Meds ordered this encounter  Medications   cloNIDine (CATAPRES - DOSED IN MG/24 HR) 0.1 mg/24hr patch    Sig: Place 1 patch (0.1 mg total) onto the skin once a week.    Dispense:  4 patch    Refill:  0    Order  Specific Question:   Supervising Provider    Answer:   Jodelle Red [[id number]]   amLODipine-valsartan (EXFORGE) 10-320 MG tablet    Sig: Take 1 tablet by mouth daily.    Dispense:  30 tablet    Refill:  2     Signed, Alver Sorrow, NP  12/20/2022 11:33 AM    [facility]

## 2022-12-20 ENCOUNTER — Ambulatory Visit (INDEPENDENT_AMBULATORY_CARE_PROVIDER_SITE_OTHER): Payer: Medicare HMO

## 2022-12-20 ENCOUNTER — Ambulatory Visit (INDEPENDENT_AMBULATORY_CARE_PROVIDER_SITE_OTHER): Payer: Medicare HMO | Admitting: Family

## 2022-12-20 ENCOUNTER — Encounter (HOSPITAL_BASED_OUTPATIENT_CLINIC_OR_DEPARTMENT_OTHER): Payer: Self-pay | Admitting: Family

## 2022-12-20 VITALS — BP 152/80 | HR 74 | Ht 62.0 in | Wt 265.0 lb

## 2022-12-20 DIAGNOSIS — E785 Hyperlipidemia, unspecified: Secondary | ICD-10-CM | POA: Diagnosis not present

## 2022-12-20 DIAGNOSIS — R6889 Other general symptoms and signs: Secondary | ICD-10-CM | POA: Diagnosis not present

## 2022-12-20 DIAGNOSIS — E1159 Type 2 diabetes mellitus with other circulatory complications: Secondary | ICD-10-CM

## 2022-12-20 DIAGNOSIS — I700 Atherosclerosis of aorta: Secondary | ICD-10-CM

## 2022-12-20 DIAGNOSIS — I10 Essential (primary) hypertension: Secondary | ICD-10-CM | POA: Diagnosis not present

## 2022-12-20 DIAGNOSIS — G4733 Obstructive sleep apnea (adult) (pediatric): Secondary | ICD-10-CM | POA: Diagnosis not present

## 2022-12-20 DIAGNOSIS — M48062 Spinal stenosis, lumbar region with neurogenic claudication: Secondary | ICD-10-CM | POA: Diagnosis not present

## 2022-12-20 DIAGNOSIS — E1165 Type 2 diabetes mellitus with hyperglycemia: Secondary | ICD-10-CM | POA: Diagnosis not present

## 2022-12-20 DIAGNOSIS — E1142 Type 2 diabetes mellitus with diabetic polyneuropathy: Secondary | ICD-10-CM | POA: Diagnosis not present

## 2022-12-20 DIAGNOSIS — I152 Hypertension secondary to endocrine disorders: Secondary | ICD-10-CM

## 2022-12-20 DIAGNOSIS — N1831 Chronic kidney disease, stage 3a: Secondary | ICD-10-CM | POA: Diagnosis not present

## 2022-12-20 DIAGNOSIS — Z6841 Body Mass Index (BMI) 40.0 and over, adult: Secondary | ICD-10-CM

## 2022-12-20 DIAGNOSIS — E1122 Type 2 diabetes mellitus with diabetic chronic kidney disease: Secondary | ICD-10-CM | POA: Diagnosis not present

## 2022-12-20 DIAGNOSIS — K529 Noninfective gastroenteritis and colitis, unspecified: Secondary | ICD-10-CM | POA: Diagnosis not present

## 2022-12-20 DIAGNOSIS — M4316 Spondylolisthesis, lumbar region: Secondary | ICD-10-CM | POA: Diagnosis not present

## 2022-12-20 LAB — COMPREHENSIVE METABOLIC PANEL
ALT: 16 IU/L (ref 0–32)
AST: 11 IU/L (ref 0–40)
Albumin: 3.6 g/dL — ABNORMAL LOW (ref 3.9–4.9)
Alkaline Phosphatase: 89 IU/L (ref 44–121)
BUN/Creatinine Ratio: 11 — ABNORMAL LOW (ref 12–28)
BUN: 15 mg/dL (ref 8–27)
Bilirubin Total: 0.2 mg/dL (ref 0.0–1.2)
CO2: 23 mmol/L (ref 20–29)
Calcium: 9.4 mg/dL (ref 8.7–10.3)
Chloride: 100 mmol/L (ref 96–106)
Creatinine, Ser: 1.33 mg/dL — ABNORMAL HIGH (ref 0.57–1.00)
Globulin, Total: 3.4 g/dL (ref 1.5–4.5)
Glucose: 348 mg/dL — ABNORMAL HIGH (ref 70–99)
Potassium: 4.7 mmol/L (ref 3.5–5.2)
Sodium: 136 mmol/L (ref 134–144)
Total Protein: 7 g/dL (ref 6.0–8.5)
eGFR: 46 mL/min/{1.73_m2} — ABNORMAL LOW (ref >59)

## 2022-12-20 LAB — TSH: TSH: 3.37 u[IU]/mL (ref 0.450–4.500)

## 2022-12-20 LAB — LDL CHOLESTEROL, DIRECT: LDL Direct: 79 mg/dL (ref 0–99)

## 2022-12-20 LAB — ALDOSTERONE + RENIN ACTIVITY W/ RATIO
Aldos/Renin Ratio: 26.3 (ref 0.0–30.0)
Aldosterone: 10.9 ng/dL (ref 0.0–30.0)
Renin Activity, Plasma: 0.415 ng/mL/h (ref 0.167–5.380)

## 2022-12-20 MED ORDER — AMLODIPINE BESYLATE-VALSARTAN 10-320 MG PO TABS: 1.0000 | ORAL_TABLET | Freq: Every day | ORAL | 2 refills | Status: DC

## 2022-12-20 MED ORDER — CLONIDINE 0.1 MG/24HR TD PTWK
0.1000 mg | MEDICATED_PATCH | TRANSDERMAL | 0 refills | Status: DC
Start: 2022-12-20 — End: 2023-01-03

## 2022-12-20 NOTE — Patient Instructions (Addendum)
Medication Instructions:  Your physician has recommended you make the following change in your medication:  STOP Clonidine tablet  START Clonidine 0.1mg  patch once per week  CONTINUE Carvedilol 25mg  twice daily and Amlodipine-Valsartan 10-320mg  daily.   *If you need a refill on your cardiac medications before your next appointment, please call your pharmacy*  Testing/Procedures: We will let you know the results of your kidney artery ultrasound Tuesday.   Follow-Up: At [facility], you and your health needs are our priority.  As part of our continuing mission to provide you with exceptional heart care, we have created designated Provider Care Teams.  These Care Teams include your primary Cardiologist (physician) and Advanced Practice Providers (APPs -  Physician Assistants and Nurse Practitioners) who all work together to provide you with the care you need, when you need it.  We recommend signing up for the patient portal called "MyChart".  Sign up information is provided on this After Visit Summary.  MyChart is used to connect with patients for Virtual Visits (Telemedicine).  Patients are able to view lab/test results, encounter notes, upcoming appointments, etc.  Non-urgent messages can be sent to your provider as well.   To learn more about what you can do with MyChart, go to [email or web address].    Your next appointment:   2 month(s) in Hypertension Clinic   Other Instructions  Bring your CPAP to your next office visit. We will check if it is one that is on recall or not. Untreated sleep apnea can contribute to high blood pressure.   We will call you in 2 weeks to check in on your blood pressure.  Tips to Measure your Blood Pressure Correctly  Here's what you can do to ensure a correct reading:  Don't drink a caffeinated beverage or smoke during the 30 minutes before the test.  Sit quietly for five minutes before the test begins.  During the measurement, sit in a  chair with your feet on the floor and your arm supported so your elbow is at about heart level.  The inflatable part of the cuff should completely cover at least 80% of your upper arm, and the cuff should be placed on bare skin, not over a shirt.  Don't talk during the measurement.   Blood pressure categories  Blood pressure category SYSTOLIC (upper number)  DIASTOLIC (lower number)  Normal Less than 120 mm Hg and Less than 80 mm Hg  Elevated 120-129 mm Hg and Less than 80 mm Hg  High blood pressure: Stage 1 hypertension 130-139 mm Hg or 80-89 mm Hg  High blood pressure: Stage 2 hypertension 140 mm Hg or higher or 90 mm Hg or higher  Hypertensive crisis (consult your doctor immediately) Higher than 180 mm Hg and/or Higher than 120 mm Hg  Source: American Heart Association and American Stroke Association. For more on getting your blood pressure under control, buy Controlling Your Blood Pressure, a Special Health Report from [facility].   Blood Pressure Log   Date   Time  Blood Pressure  Example: Nov 1 9 AM 124/78

## 2022-12-23 DIAGNOSIS — E1165 Type 2 diabetes mellitus with hyperglycemia: Secondary | ICD-10-CM | POA: Diagnosis not present

## 2022-12-23 DIAGNOSIS — K529 Noninfective gastroenteritis and colitis, unspecified: Secondary | ICD-10-CM | POA: Diagnosis not present

## 2022-12-23 DIAGNOSIS — E1159 Type 2 diabetes mellitus with other circulatory complications: Secondary | ICD-10-CM | POA: Diagnosis not present

## 2022-12-23 DIAGNOSIS — M4316 Spondylolisthesis, lumbar region: Secondary | ICD-10-CM | POA: Diagnosis not present

## 2022-12-23 DIAGNOSIS — I152 Hypertension secondary to endocrine disorders: Secondary | ICD-10-CM | POA: Diagnosis not present

## 2022-12-23 DIAGNOSIS — N1831 Chronic kidney disease, stage 3a: Secondary | ICD-10-CM | POA: Diagnosis not present

## 2022-12-23 DIAGNOSIS — E1142 Type 2 diabetes mellitus with diabetic polyneuropathy: Secondary | ICD-10-CM | POA: Diagnosis not present

## 2022-12-23 DIAGNOSIS — M48062 Spinal stenosis, lumbar region with neurogenic claudication: Secondary | ICD-10-CM | POA: Diagnosis not present

## 2022-12-23 DIAGNOSIS — E1122 Type 2 diabetes mellitus with diabetic chronic kidney disease: Secondary | ICD-10-CM | POA: Diagnosis not present

## 2022-12-27 DIAGNOSIS — E1142 Type 2 diabetes mellitus with diabetic polyneuropathy: Secondary | ICD-10-CM | POA: Diagnosis not present

## 2022-12-27 DIAGNOSIS — I152 Hypertension secondary to endocrine disorders: Secondary | ICD-10-CM | POA: Diagnosis not present

## 2022-12-27 DIAGNOSIS — E1122 Type 2 diabetes mellitus with diabetic chronic kidney disease: Secondary | ICD-10-CM | POA: Diagnosis not present

## 2022-12-27 DIAGNOSIS — E1159 Type 2 diabetes mellitus with other circulatory complications: Secondary | ICD-10-CM | POA: Diagnosis not present

## 2022-12-27 DIAGNOSIS — E1165 Type 2 diabetes mellitus with hyperglycemia: Secondary | ICD-10-CM | POA: Diagnosis not present

## 2022-12-27 DIAGNOSIS — N1831 Chronic kidney disease, stage 3a: Secondary | ICD-10-CM | POA: Diagnosis not present

## 2022-12-27 DIAGNOSIS — K529 Noninfective gastroenteritis and colitis, unspecified: Secondary | ICD-10-CM | POA: Diagnosis not present

## 2022-12-27 DIAGNOSIS — M48062 Spinal stenosis, lumbar region with neurogenic claudication: Secondary | ICD-10-CM | POA: Diagnosis not present

## 2022-12-27 DIAGNOSIS — F4323 Adjustment disorder with mixed anxiety and depressed mood: Secondary | ICD-10-CM | POA: Diagnosis not present

## 2022-12-27 DIAGNOSIS — M4316 Spondylolisthesis, lumbar region: Secondary | ICD-10-CM | POA: Diagnosis not present

## 2022-12-30 ENCOUNTER — Ambulatory Visit: Payer: Medicare HMO | Admitting: Psychiatry

## 2023-01-03 ENCOUNTER — Other Ambulatory Visit: Payer: Self-pay | Admitting: *Deleted

## 2023-01-03 ENCOUNTER — Encounter (HOSPITAL_BASED_OUTPATIENT_CLINIC_OR_DEPARTMENT_OTHER): Payer: Self-pay | Admitting: *Deleted

## 2023-01-03 DIAGNOSIS — M4316 Spondylolisthesis, lumbar region: Secondary | ICD-10-CM | POA: Diagnosis not present

## 2023-01-03 DIAGNOSIS — I152 Hypertension secondary to endocrine disorders: Secondary | ICD-10-CM | POA: Diagnosis not present

## 2023-01-03 DIAGNOSIS — K529 Noninfective gastroenteritis and colitis, unspecified: Secondary | ICD-10-CM | POA: Diagnosis not present

## 2023-01-03 DIAGNOSIS — E1122 Type 2 diabetes mellitus with diabetic chronic kidney disease: Secondary | ICD-10-CM | POA: Diagnosis not present

## 2023-01-03 DIAGNOSIS — M48062 Spinal stenosis, lumbar region with neurogenic claudication: Secondary | ICD-10-CM | POA: Diagnosis not present

## 2023-01-03 DIAGNOSIS — F4323 Adjustment disorder with mixed anxiety and depressed mood: Secondary | ICD-10-CM | POA: Diagnosis not present

## 2023-01-03 DIAGNOSIS — I10 Essential (primary) hypertension: Secondary | ICD-10-CM

## 2023-01-03 DIAGNOSIS — E1142 Type 2 diabetes mellitus with diabetic polyneuropathy: Secondary | ICD-10-CM | POA: Diagnosis not present

## 2023-01-03 DIAGNOSIS — N1831 Chronic kidney disease, stage 3a: Secondary | ICD-10-CM | POA: Diagnosis not present

## 2023-01-03 DIAGNOSIS — E1165 Type 2 diabetes mellitus with hyperglycemia: Secondary | ICD-10-CM | POA: Diagnosis not present

## 2023-01-03 DIAGNOSIS — E1159 Type 2 diabetes mellitus with other circulatory complications: Secondary | ICD-10-CM | POA: Diagnosis not present

## 2023-01-03 MED ORDER — CLONIDINE 0.1 MG/24HR TD PTWK: 0.1000 mg | MEDICATED_PATCH | TRANSDERMAL | 11 refills | Status: DC

## 2023-01-03 MED ORDER — AMLODIPINE BESYLATE-VALSARTAN 10-320 MG PO TABS: 1.0000 | ORAL_TABLET | Freq: Every day | ORAL | 3 refills | Status: DC

## 2023-01-03 NOTE — Telephone Encounter (Signed)
Okay to refill - TY!

## 2023-01-03 NOTE — Telephone Encounter (Signed)
This encounter was created in error - please disregard.

## 2023-01-04 ENCOUNTER — Other Ambulatory Visit: Payer: Self-pay | Admitting: Family Medicine

## 2023-01-06 ENCOUNTER — Other Ambulatory Visit: Payer: Self-pay

## 2023-01-06 ENCOUNTER — Telehealth: Payer: Self-pay | Admitting: Family Medicine

## 2023-01-06 NOTE — Telephone Encounter (Signed)
Patient called requesting a refill of ; sertraline (ZOLOFT) 100 MG tablet [[id number]] and lorazepam   Pharmacy ;   [facility] [address] [place] - [address]

## 2023-01-06 NOTE — Progress Notes (Signed)
Pharmacy Quality Measure Review  This patient is appearing on a report for being at risk of failing the Controlling Blood Pressure measure this calendar year.   -Last documented BP 152/80 on 12/20/22 -Contacted patient to assess adherence to medications and home BP -Current regimen:  amlodipine/valsartan 10/320mg  daily, clonidine 0.1mg  patch changed weekly, carvedilol 25mg  BID -Patient is followed by hypertension clinic, and oral clonidine was change to weekly patch at last visit.  It was not clear if patient was taking the tablet on a regular basis, so this increased ease of regimen. -Patient endorses good adherence to BP medications, but she was out of amlodipine/valsartan recently.  Went without for at least 3 days but has been back on since Saturday -She does check home BP but is not able to provide readings -F/u telephone visit scheduled for next Monday-instructed patient to monitor and record daily BP readings between now and then  Lenna Gilford, PharmD, DPLA

## 2023-01-07 ENCOUNTER — Encounter (HOSPITAL_BASED_OUTPATIENT_CLINIC_OR_DEPARTMENT_OTHER): Payer: Self-pay

## 2023-01-08 ENCOUNTER — Other Ambulatory Visit: Payer: Self-pay

## 2023-01-10 ENCOUNTER — Other Ambulatory Visit: Payer: Self-pay | Admitting: Family Medicine

## 2023-01-10 MED ORDER — SERTRALINE HCL 100 MG PO TABS: 150.0000 mg | ORAL_TABLET | Freq: Every day | ORAL | 1 refills | Status: DC

## 2023-01-10 NOTE — Telephone Encounter (Signed)
Completed.

## 2023-01-13 ENCOUNTER — Other Ambulatory Visit: Payer: Medicare HMO

## 2023-01-13 ENCOUNTER — Other Ambulatory Visit: Payer: Self-pay | Admitting: Family Medicine

## 2023-01-13 DIAGNOSIS — E1142 Type 2 diabetes mellitus with diabetic polyneuropathy: Secondary | ICD-10-CM | POA: Diagnosis not present

## 2023-01-13 DIAGNOSIS — N1831 Chronic kidney disease, stage 3a: Secondary | ICD-10-CM | POA: Diagnosis not present

## 2023-01-13 DIAGNOSIS — M48062 Spinal stenosis, lumbar region with neurogenic claudication: Secondary | ICD-10-CM | POA: Diagnosis not present

## 2023-01-13 DIAGNOSIS — E1165 Type 2 diabetes mellitus with hyperglycemia: Secondary | ICD-10-CM | POA: Diagnosis not present

## 2023-01-13 DIAGNOSIS — K529 Noninfective gastroenteritis and colitis, unspecified: Secondary | ICD-10-CM | POA: Diagnosis not present

## 2023-01-13 DIAGNOSIS — I152 Hypertension secondary to endocrine disorders: Secondary | ICD-10-CM | POA: Diagnosis not present

## 2023-01-13 DIAGNOSIS — E1122 Type 2 diabetes mellitus with diabetic chronic kidney disease: Secondary | ICD-10-CM | POA: Diagnosis not present

## 2023-01-13 DIAGNOSIS — M4316 Spondylolisthesis, lumbar region: Secondary | ICD-10-CM | POA: Diagnosis not present

## 2023-01-13 DIAGNOSIS — E1159 Type 2 diabetes mellitus with other circulatory complications: Secondary | ICD-10-CM | POA: Diagnosis not present

## 2023-01-13 NOTE — Progress Notes (Signed)
01/13/2023  Patient ID: Gabriela Guerrero, female   DOB: 09-24-1960, 62 y.o.   MRN: [id number]  Pharmacy Quality Measure Review  This patient is appearing on a report for being at risk of failing the Controlling Blood Pressure measure this calendar year.   Last documented BP 152/80 on 8/30 -Telephone follow-up to evaluate home BP readings -Patient has been monitoring and recording BP on a daily basis -PT was at her home today for a therapy appointment, and they also checked her BP.  This reading was 138/30 -Recommended that patient continue to monitor and record daily BP to bring to f/u PCP and CVD visits -Patient inquiring about methods to help keep clonidine patch/patch cover on, as this week the protective cover came off of her patch.  Informed her that if this happens, she can replace these with tegaderm patches or even bandaids if needed to help secure medication patch  Lenna Gilford, PharmD, DPLA

## 2023-01-14 ENCOUNTER — Other Ambulatory Visit: Payer: Self-pay | Admitting: Family Medicine

## 2023-01-14 NOTE — Telephone Encounter (Signed)
Pls contact patient to schedule appt for medication refills. Past due since July. Thanks

## 2023-01-14 NOTE — Telephone Encounter (Signed)
Patient is scheduled for 01/22/2023, thanks.

## 2023-01-15 NOTE — Telephone Encounter (Signed)
Patient already has appointment on 10/2

## 2023-01-18 ENCOUNTER — Other Ambulatory Visit: Payer: Self-pay | Admitting: Family Medicine

## 2023-01-18 DIAGNOSIS — K219 Gastro-esophageal reflux disease without esophagitis: Secondary | ICD-10-CM

## 2023-01-22 ENCOUNTER — Encounter: Payer: Self-pay | Admitting: Family Medicine

## 2023-01-22 ENCOUNTER — Ambulatory Visit (INDEPENDENT_AMBULATORY_CARE_PROVIDER_SITE_OTHER): Payer: Medicare HMO | Admitting: Family Medicine

## 2023-01-22 VITALS — BP 134/74 | HR 73 | Ht 62.0 in | Wt 263.0 lb

## 2023-01-22 DIAGNOSIS — K219 Gastro-esophageal reflux disease without esophagitis: Secondary | ICD-10-CM | POA: Diagnosis not present

## 2023-01-22 DIAGNOSIS — K148 Other diseases of tongue: Secondary | ICD-10-CM

## 2023-01-22 DIAGNOSIS — Z124 Encounter for screening for malignant neoplasm of cervix: Secondary | ICD-10-CM | POA: Diagnosis not present

## 2023-01-22 DIAGNOSIS — I152 Hypertension secondary to endocrine disorders: Secondary | ICD-10-CM

## 2023-01-22 DIAGNOSIS — F329 Major depressive disorder, single episode, unspecified: Secondary | ICD-10-CM | POA: Diagnosis not present

## 2023-01-22 DIAGNOSIS — Z1231 Encounter for screening mammogram for malignant neoplasm of breast: Secondary | ICD-10-CM

## 2023-01-22 DIAGNOSIS — E1165 Type 2 diabetes mellitus with hyperglycemia: Secondary | ICD-10-CM

## 2023-01-22 DIAGNOSIS — E1159 Type 2 diabetes mellitus with other circulatory complications: Secondary | ICD-10-CM

## 2023-01-22 DIAGNOSIS — Z794 Long term (current) use of insulin: Secondary | ICD-10-CM

## 2023-01-22 DIAGNOSIS — B372 Candidiasis of skin and nail: Secondary | ICD-10-CM

## 2023-01-22 DIAGNOSIS — E11319 Type 2 diabetes mellitus with unspecified diabetic retinopathy without macular edema: Secondary | ICD-10-CM

## 2023-01-22 DIAGNOSIS — R6889 Other general symptoms and signs: Secondary | ICD-10-CM | POA: Diagnosis not present

## 2023-01-22 LAB — POCT GLYCOSYLATED HEMOGLOBIN (HGB A1C): HbA1c, POC (controlled diabetic range): 8.9 % — AB (ref 0.0–7.0)

## 2023-01-22 MED ORDER — NYSTATIN 100000 UNIT/GM EX POWD: 1.0000 | Freq: Three times a day (TID) | CUTANEOUS | 3 refills | Status: AC

## 2023-01-22 MED ORDER — SEMAGLUTIDE (1 MG/DOSE) 4 MG/3ML ~~LOC~~ SOPN: 1.0000 mg | PEN_INJECTOR | SUBCUTANEOUS | 0 refills | Status: DC

## 2023-01-22 MED ORDER — SEMAGLUTIDE (1 MG/DOSE) 4 MG/3ML ~~LOC~~ SOPN: 1.0000 mg | PEN_INJECTOR | SUBCUTANEOUS | 1 refills | Status: DC

## 2023-01-22 MED ORDER — ONDANSETRON 4 MG PO TBDP
ORAL_TABLET | ORAL | 0 refills | Status: DC
Start: 2023-01-22 — End: 2023-07-24

## 2023-01-22 NOTE — Progress Notes (Signed)
Gabriela Guerrero - 62 y.o. female MRN [id number]  Date of birth: 02/04/1961  Subjective Chief Complaint  Patient presents with   Hypertension   Diabetes    HPI Gabriela Guerrero is a 62 y.o. female here today for follow up visit.   She reports she is doing pretty well.  She has been seeing advanced hypertension clinic for management of her blood pressure.  Blood pressure is much better controlled at this time.  She is on Exforge, carvedilol and clonidine patch.  Tolerating this well at this time.  Denies side effects.  Blood sugars are not well-controlled.  A1c 8.9%. currently using Ozempic 0.5 mg of weekly, Toujeo 60 units daily and NovoLog 12 units with meals.  Denies hypoglycemia.  She does have some nausea due to after using Ozempic.  History of anxiety and is managed with Zoloft 100 mg daily.  Additionally she has lorazepam that she uses rarely.  No side effects with this.  Several health maintenance items need to be updated including cervical cancer screening  ROS:  A comprehensive ROS was completed and negative except as noted per HPI  No Known Allergies  Past Medical History:  Diagnosis Date   Hypertension     Past Surgical History:  Procedure Laterality Date   LUMBAR FUSION      Social History   Socioeconomic History   Marital status: Single    Spouse name: Not on file   Number of children: Not on file   Years of education: Not on file   Highest education level: Not on file  Occupational History   Occupation: Disabled  Tobacco Use   Smoking status: Never   Smokeless tobacco: Never  Vaping Use   Vaping status: Never Used  Substance and Sexual Activity   Alcohol use: Never   Drug use: Never   Sexual activity: Not Currently    Partners: Male    Birth control/protection: Abstinence  Other Topics Concern   Not on file  Social History Narrative   Not on file   Social Determinants of Health   Financial Resource Strain: High Risk (11/22/2022)   Overall Financial  Resource Strain (CARDIA)    Difficulty of Paying Living Expenses: Hard  Food Insecurity: No Food Insecurity (10/28/2022)   Received [facility]   Hunger Vital Sign    Worried About Running Out of Food in the Last Year: Never true    Ran Out of Food in the Last Year: Never true  Recent Concern: Food Insecurity - Food Insecurity Present (09/20/2022)   Received from [facility], [facility]   Hunger Vital Sign    Worried About Running Out of Food in the Last Year: Sometimes true    Ran Out of Food in the Last Year: Never true  Transportation Needs: No Transportation Needs (11/21/2022)   PRAPARE - Administrator, Civil Service (Medical): No    Lack of Transportation (Non-Medical): No  Physical Activity: Unknown (02/22/2022)   Received from [facility], [facility]   Exercise Vital Sign    Days of Exercise per Week: 1 day    Minutes of Exercise per Session: Not on file  Stress: Stress Concern Present (09/20/2022)   Received from [facility] of Occupational Health - Occupational Stress Questionnaire    Feeling of Stress : Very much  Social Connections: Somewhat Isolated (02/22/2022)   Received from [facility], [facility]   Social Network    How would  you rate your social network (family, work, friends)?: Restricted participation with some degree of social isolation    Family History  Problem Relation Age of Onset   Hypertension Mother    Breast cancer Mother    Hypertension Sister        died of aneurysm from HTN   Stroke Sister     Health Maintenance  Topic Date Due   Medicare Annual Wellness (AWV)  Never done   OPHTHALMOLOGY EXAM  Never done   HIV Screening  Never done   Hepatitis C Screening  Never done   Cervical Cancer Screening (HPV/Pap Cotest)  Never done   Zoster Vaccines- Shingrix (1 of 2) Never done   MAMMOGRAM  04/20/2020   INFLUENZA VACCINE  11/21/2022   COVID-19 Vaccine (3 - 2023-24 season)  12/22/2022   HEMOGLOBIN A1C  07/23/2023   Diabetic kidney evaluation - Urine ACR  09/24/2023   Diabetic kidney evaluation - eGFR measurement  12/11/2023   FOOT EXAM  01/22/2024   DTaP/Tdap/Td (2 - Td or Tdap) 06/26/2025   Colonoscopy  01/31/2029   HPV VACCINES  Aged Out     ----------------------------------------------------------------------------------------------------------------------------------------------------------------------------------------------------------------- Physical Exam BP 134/74 (BP Location: Left Arm, Patient Position: Sitting, Cuff Size: Large)   Pulse 73   Ht 5\' 2"  (1.575 m)   Wt 263 lb (119.3 kg)   SpO2 97%   BMI 48.10 kg/m   Physical Exam Constitutional:      Appearance: Normal appearance.  HENT:     Head: Normocephalic and atraumatic.  Eyes:     General: No scleral icterus. Cardiovascular:     Rate and Rhythm: Normal rate and regular rhythm.  Pulmonary:     Effort: Pulmonary effort is normal.     Breath sounds: Normal breath sounds.  Musculoskeletal:     Cervical back: Neck supple.  Neurological:     Mental Status: She is alert.  Psychiatric:        Mood and Affect: Mood normal.        Behavior: Behavior normal.     ------------------------------------------------------------------------------------------------------------------------------------------------------------------------------------------------------------------- Assessment and Plan  Tongue lesion Reports that she never had anything for oral surgery.  Referral reentered.  MDD (major depressive disorder) History of depression and anxiety.  Remains on sertraline with lorazepam on occasion.  Will plan to continue sertraline at current strength.  I asked her to minimize use of lorazepam.  Hypertension associated with diabetes (HCC) Blood pressure is much better controlled.  She will continue Exforge with clonidine and carvedilol.  Diabetic retinopathy [facility]) Referral placed  ophthalmology for continued monitoring.  Uncontrolled type 2 diabetes mellitus with hyperglycemia, with long-term current use of insulin (HCC) Diabetes is not well-controlled.  Increasing Ozempic to 1 mg.  Continue insulin at current strength.  Encouraged dietary change.   Meds ordered this encounter  Medications   nystatin (MYCOSTATIN/NYSTOP) powder    Sig: Apply 1 Application topically 3 (three) times daily.    Dispense:  135 g    Refill:  3   ondansetron (ZOFRAN-ODT) 4 MG disintegrating tablet    Sig: DISSOLVE 1 TABLET ON THE TONGUE EVERY 8 HOURS AS NEEDED FOR NAUSEA AND VOMITING    Dispense:  20 tablet    Refill:  0   DISCONTD: Semaglutide, 1 MG/DOSE, 4 MG/3ML SOPN    Sig: Inject 1 mg as directed once a week.    Dispense:  9 mL    Refill:  1   Semaglutide, 1 MG/DOSE, 4 MG/3ML SOPN    Sig:  Inject 1 mg as directed once a week.    Dispense:  3 mL    Refill:  0    Return in about 3 months (around 04/24/2023) for Type 2 Diabetes, Hypertension.    This visit occurred during the SARS-CoV-2 public health emergency.  Safety protocols were in place, including screening questions prior to the visit, additional usage of staff PPE, and extensive cleaning of exam room while observing appropriate contact time as indicated for disinfecting solutions.

## 2023-01-22 NOTE — Assessment & Plan Note (Signed)
History of depression and anxiety.  Remains on sertraline with lorazepam on occasion.  Will plan to continue sertraline at current strength.  I asked her to minimize use of lorazepam.

## 2023-01-22 NOTE — Assessment & Plan Note (Signed)
Blood pressure is much better controlled.  She will continue Exforge with clonidine and carvedilol.

## 2023-01-22 NOTE — Assessment & Plan Note (Signed)
Diabetes is not well-controlled.  Increasing Ozempic to 1 mg.  Continue insulin at current strength.  Encouraged dietary change.

## 2023-01-22 NOTE — Assessment & Plan Note (Signed)
Referral placed ophthalmology for continued monitoring.

## 2023-01-22 NOTE — Assessment & Plan Note (Signed)
Reports that she never had anything for oral surgery.  Referral reentered.

## 2023-01-22 NOTE — Patient Instructions (Addendum)
Increase ozempic to 1mg  weekly.  Try injections in the leg rather than the abdomen to help with nausea.

## 2023-01-23 ENCOUNTER — Telehealth: Payer: Self-pay | Admitting: Family Medicine

## 2023-01-23 NOTE — Telephone Encounter (Signed)
Patient called wanting to clarify if she has received 2 different referrals for Neurology. Patient had a referral placed on 09/24/22 for Dr. Ane Payment. She has receive a call to confirm an appointment with them on 01/24/23 and another office on 02/26/23. Please advise.

## 2023-01-24 DIAGNOSIS — G43E09 Chronic migraine with aura, not intractable, without status migrainosus: Secondary | ICD-10-CM | POA: Diagnosis not present

## 2023-01-24 DIAGNOSIS — I10 Essential (primary) hypertension: Secondary | ICD-10-CM | POA: Diagnosis not present

## 2023-01-24 NOTE — Telephone Encounter (Signed)
Referral would have been sent to different office based on patients request. Please have patient contact [facility] and [facility] to determine which appointment she would like to keep.   Left message on patients voicemail advising her that referral was sent to 2 different offices based on her request to be seen sooner.

## 2023-02-08 ENCOUNTER — Other Ambulatory Visit: Payer: Self-pay | Admitting: Family Medicine

## 2023-02-08 DIAGNOSIS — E1159 Type 2 diabetes mellitus with other circulatory complications: Secondary | ICD-10-CM

## 2023-02-13 ENCOUNTER — Encounter (HOSPITAL_BASED_OUTPATIENT_CLINIC_OR_DEPARTMENT_OTHER): Payer: Medicare HMO | Admitting: Family

## 2023-02-13 NOTE — Progress Notes (Deleted)
Advanced Hypertension Clinic Assessment:    Date:  02/13/2023   ID:  Gabriela Guerrero, DOB January 22, 1961, MRN [id number]  PCP:  Everrett Coombe, DO  Cardiologist:  None  Nephrologist:  Referring MD: Everrett Coombe, DO   CC: Hypertension  History of Present Illness:    Gabriela Guerrero is a 62 y.o. female with a hx of hypertension, obesity, aortic atherosclerosis, HLD, DM2, OSA, neuropathy, prior back surgery here to follow up in the Advanced Hypertension Clinic.   Prior echo 10/24/21 normal LVEF 65-70%, moderate diastolic dysfunction, LA mildly dilated, mild MR/TR.  Prior CT 07/2020 normal adrenal glands, aortic atherosclerosis.  Established with Advanced Hypertension Clinic 11/21/22. Gabriela Guerrero was diagnosed with hypertension in her 72s. No tobacco use. Exercise tolerance limited by prior failed back surgery.Getting meals through [employer]. Amlodipine and Losartan discontinued, she was started on Amlodipine-Valsartan 10-320mg  daily. Coreg 25mg  BID, Clonidine 0.1mg  BID continued. Stressors noted related to housing, SW consulted and gave resources for income based housing. TSH normal. Direct LDL of 79, Atorvastatin increased to 40mg  daily.   ED visit at [facility] 12/11/22 after falling on cement and hitting head on car. X-ray with no fracture no dislocation.   Last seen 12/20/22, Clonidine tablet was transitioned to Clonidine patch. She was asked to bring BP cuff to next visit. She was not using CPAP as was feared it was on recall and was asked to bring to follow up. Renal duplex 12/20/22 with no renal artery stenosis. Unremarkable TSH, renin-aldosterone.   Presents today for follow up. ***  Previous antihypertensives: Losartan-hydrochlorothiazide  Past Medical History:  Diagnosis Date   Hypertension     Past Surgical History:  Procedure Laterality Date   LUMBAR FUSION      Current Medications: No outpatient medications have been marked as taking for the 02/13/23 encounter (Appointment) with  Alver Sorrow, NP.     Allergies:   Patient has no known allergies.   Social History   Socioeconomic History   Marital status: Single    Spouse name: Not on file   Number of children: Not on file   Years of education: Not on file   Highest education level: Not on file  Occupational History   Occupation: Disabled  Tobacco Use   Smoking status: Never   Smokeless tobacco: Never  Vaping Use   Vaping status: Never Used  Substance and Sexual Activity   Alcohol use: Never   Drug use: Never   Sexual activity: Not Currently    Partners: Male    Birth control/protection: Abstinence  Other Topics Concern   Not on file  Social History Narrative   Not on file   Social Determinants of Health   Financial Resource Strain: High Risk (11/22/2022)   Overall Financial Resource Strain (CARDIA)    Difficulty of Paying Living Expenses: Hard  Food Insecurity: No Food Insecurity (10/28/2022)   Received [facility]   Hunger Vital Sign    Worried About Running Out of Food in the Last Year: Never true    Ran Out of Food in the Last Year: Never true  Recent Concern: Food Insecurity - Food Insecurity Present (09/20/2022)   Received from [facility], [facility]   Hunger Vital Sign    Worried About Running Out of Food in the Last Year: Sometimes true    Ran Out of Food in the Last Year: Never true  Transportation Needs: No Transportation Needs (11/21/2022)   PRAPARE - Transportation    Lack of  Transportation (Medical): No    Lack of Transportation (Non-Medical): No  Physical Activity: Unknown (02/22/2022)   Received from [facility], [facility]   Exercise Vital Sign    Days of Exercise per Week: 1 day    Minutes of Exercise per Session: Not on file  Stress: Stress Concern Present (09/20/2022)   Received from [facility], [facility] of Occupational Health - Occupational Stress Questionnaire    Feeling of Stress : Very much  Social Connections: Somewhat  Isolated (02/22/2022)   Received from [facility], [facility]   Social Network    How would you rate your social network (family, work, friends)?: Restricted participation with some degree of social isolation     Family History: The patient's family history includes Breast cancer in her mother; Hypertension in her mother and sister; Stroke in her sister.  ROS:   Please see the history of present illness.    All other systems reviewed and are negative.  EKGs/Labs/Other Studies Reviewed:         Recent Labs: 12/11/2022: ALT 16; BUN 15; Creatinine, Ser 1.33; Potassium 4.7; Sodium 136; TSH 3.370   Recent Lipid Panel    Component Value Date/Time   LDLDIRECT 79 12/11/2022 0858    Physical Exam:   VS:  There were no vitals taken for this visit. , BMI There is no height or weight on file to calculate BMI. GENERAL:  Well appearing HEENT: Pupils equal round and reactive, fundi not visualized, oral mucosa unremarkable NECK:  No jugular venous distention, waveform within normal limits, carotid upstroke brisk and symmetric, no bruits, no thyromegaly LYMPHATICS:  No cervical adenopathy LUNGS:  Clear to auscultation bilaterally HEART:  RRR.  PMI not displaced or sustained,S1 and S2 within normal limits, no S3, no S4, no clicks, no rubs, no murmurs ABD:  Flat, positive bowel sounds normal in frequency in pitch, no bruits, no rebound, no guarding, no midline pulsatile mass, no hepatomegaly, no splenomegaly EXT:  2 plus pulses throughout, no edema, no cyanosis no clubbing SKIN:  No rashes no nodules NEURO:  Cranial nerves II through XII grossly intact, motor grossly intact throughout PSYCH:  Cognitively intact, oriented to person place and time   ASSESSMENT/PLAN:    HTN -*** Renin aldosterone with no hyperaldosteronism and normal TSH  Renal duplex performed today with results not yet available.  Aortic atherosclerosis/hyperlipidemia - Stable with no anginal symptoms. No indication for  ischemic evaluation.  Continue atorvastatin 40mg  daily which was increased 12/11/22 due to LDL 79. Repeat FLP/LFT already ordered.  LE edema -echo 10/2021 normal LVEF.  Nonpitting on exam.  Likely element of venous insufficiency as she is predominately sits with legs in the dependent position.  Continue as needed Lasix.***  OSA -she stopped using CPAP during time there was a recall though she is not certain her specific machine was the one recalled.  Asked her to bring her CPAP to her next OV ***  Obesity - Weight loss via diet and exercise encouraged. Discussed the impact being overweight would have on cardiovascular risk.  Exercise limited related to back issues.  Discussed needing to focus on weight loss through dietary changes.  Screening for Secondary Hypertension:     11/21/2022    1:02 PM  Causes  Renovascular HTN Screened     - Comments 11/2022 renal duplex ordered.  Sleep Apnea Screened     - Comments Encouraged to utilize CPAP.  Thyroid Disease Screened  Hyperaldosteronism Screened     -  Comments 11/2022 renal -aldosterone ordered.  Pheochromocytoma Screened     - Comments Prior CT normal adrenal glands.  Coarctation of the Aorta Screened     - Comments BP symmetrical.  Compliance Screened    Relevant Labs/Studies:    Latest Ref Rng & Units 12/11/2022    8:58 AM 11/21/2022    9:57 AM 03/22/2021    7:25 PM  Basic Labs  Sodium 134 - 144 mmol/L 136  137  134   Potassium 3.5 - 5.2 mmol/L 4.7  4.7  4.0   Creatinine 0.57 - 1.00 mg/dL 8.25  0.53  9.76        Latest Ref Rng & Units 12/11/2022    8:58 AM 11/21/2022    9:57 AM  Thyroid   TSH 0.450 - 4.500 uIU/mL 3.370  3.370        Latest Ref Rng & Units 12/11/2022    8:58 AM 11/21/2022    9:57 AM  Renin/Aldosterone   Aldosterone 0.0 - 30.0 ng/dL 73.4  19.3   Aldos/Renin Ratio 0.0 - 30.0 26.3  26.7              12/20/2022   10:38 AM  Renovascular   Renal Artery [place] Completed Yes     Disposition:    FU with MD/PharmD in ***  months   Medication Adjustments/Labs and Tests Ordered: Current medicines are reviewed at length with the patient today.  Concerns regarding medicines are outlined above.  No orders of the defined types were placed in this encounter.  No orders of the defined types were placed in this encounter.    Signed, Alver Sorrow, NP  02/13/2023 7:44 AM    [facility]

## 2023-02-26 ENCOUNTER — Telehealth: Payer: Self-pay | Admitting: Family Medicine

## 2023-02-26 ENCOUNTER — Encounter: Payer: Self-pay | Admitting: Neurology

## 2023-02-26 ENCOUNTER — Other Ambulatory Visit: Payer: Self-pay | Admitting: Neurology

## 2023-02-26 ENCOUNTER — Ambulatory Visit (INDEPENDENT_AMBULATORY_CARE_PROVIDER_SITE_OTHER): Payer: Medicare HMO | Admitting: Neurology

## 2023-02-26 VITALS — BP 134/72 | HR 77 | Ht 61.0 in | Wt 258.4 lb

## 2023-02-26 DIAGNOSIS — Z8673 Personal history of transient ischemic attack (TIA), and cerebral infarction without residual deficits: Secondary | ICD-10-CM | POA: Diagnosis not present

## 2023-02-26 DIAGNOSIS — G4730 Sleep apnea, unspecified: Secondary | ICD-10-CM | POA: Diagnosis not present

## 2023-02-26 DIAGNOSIS — G43709 Chronic migraine without aura, not intractable, without status migrainosus: Secondary | ICD-10-CM

## 2023-02-26 MED ORDER — NURTEC 75 MG PO TBDP: ORAL_TABLET | ORAL | Status: DC

## 2023-02-26 MED ORDER — AJOVY 225 MG/1.5ML ~~LOC~~ SOAJ
225.0000 mg | SUBCUTANEOUS | 11 refills | Status: DC
Start: 2023-02-26 — End: 2023-02-26

## 2023-02-26 NOTE — Progress Notes (Signed)
Nurtec samples 4 tablets ndc [id number] 1 lot [id number] 08/2024  .take one daily for 4 days.

## 2023-02-26 NOTE — Telephone Encounter (Signed)
Pt called office stating she thinks she has been referred to 2 neurologists  for the same thing. Pt states she is currently at an appointment today 02/26/23 at 9:00am with Dr. Willaim Bane. Pt was advised that she could cancel her appointment if she did not want to follow through with the appointment. Pt states she will go ahead and see provider today but would like something to be done regarding appointments.   Per previous notes, referral was sent to [facility] initially but wanted to be seen sooner. So referral was sent to [facility]. Patient scheduled appointments with both providers, [facility] on 01/24/23 and 02/26/23 with [facility].

## 2023-02-26 NOTE — Patient Instructions (Addendum)
Needs OSA treatment, dxed in 2021, send back to :  [facility]  [address], [address]  [place], [place] [address]  Phone: [phone]  Fax: [phone]   Need a follow up to evaluate strokes, need MRI brain on CD will order  Start Ajovy monthly injection - monthly injection for migraine prevention  Sleep Apnea - can cause headaches and strokes, please follow up and get cpap started!    Sleep apnea is a condition that affects your breathing while you are sleeping. Your tongue or soft tissue in your throat may block the flow of air while you sleep. You may have shallow breathing or stop breathing for short periods of time. People with sleep apnea may snore loudly. There are three kinds of sleep apnea: Obstructive sleep apnea. This kind is caused by a blocked or collapsed airway. This is the most common. Central sleep apnea. This kind happens when the part of the brain that controls breathing does not send the correct signals to the muscles that control breathing. Mixed sleep apnea. This is a combination of obstructive and central sleep apnea. What are the causes? The most common cause of sleep apnea is a collapsed or blocked airway. What increases the risk? Being very overweight. Having family members with sleep apnea. Having a tongue or tonsils that are larger than normal. Having a small airway or jaw problems. Being older. What are the signs or symptoms? Loud snoring. Restless sleep. Trouble staying asleep. Being sleepy or tired during the day. Waking up gasping or choking. Having a headache in the morning. Mood swings. Having a hard time remembering things and concentrating. How is this diagnosed? A medical history. A physical exam. A sleep study. This is also called a polysomnography test. This test is done at a sleep lab or in your home while you are sleeping. How is this treated? Treatment may include: Sleeping on your side. Losing weight if  you're overweight. Wearing an oral appliance. This is a mouthpiece that moves your lower jaw forward. Using a positive airway pressure (PAP) device to keep your airways open while you sleep, such as: A continuous positive airway pressure (CPAP) device. This device gives forced air through a mask when you breathe out. This keeps your airways open. A bilevel positive airway pressure (BIPAP) device. This device gives forced air through a mask when you breathe in and when you breathe out to keep your airways open. Having surgery if other treatments do not work. If your sleep apnea is not treated, you may be at risk for: Heart failure. Heart attack. Stroke. Type 2 diabetes or a problem with your blood sugar called insulin resistance. Follow these instructions at home: Medicines Take your medicines only as told by your health care provider. Avoid alcohol, medicines to help you relax, and certain pain medicines. These may make sleep apnea worse. General instructions Do not smoke, vape, or use products with nicotine or tobacco in them. If you need help quitting, talk with your provider. If you were given a PAP device to open your airway while you sleep, use it as told by your provider. If you're having surgery, make sure to tell your provider you have sleep apnea. You may need to bring your PAP device with you. Contact a health care provider if: The PAP device that you were given to use during sleep bothers you or does not seem to be working. You do not feel better or you feel worse. Get help right away  if: You have trouble breathing. You have chest pain. You have trouble talking. One side of your body feels weak. A part of your face is hanging down. These symptoms may be an emergency. Call 911 right away. Do not wait to see if the symptoms will go away. Do not drive yourself to the hospital. This information is not intended to replace advice given to you by your health care provider. Make  sure you discuss any questions you have with your health care provider. Document Revised: 06/13/2022 Document Reviewed: 06/13/2022 Elsevier Patient Education  2024 [employer]  [place] Injection What is this medication? FREMANEZUMAB (fre ma NEZ ue mab) prevents migraines. It works by blocking a substance in the body that causes migraines. It is a monoclonal antibody. This medicine may be used for other purposes; ask your health care provider or pharmacist if you have questions. COMMON BRAND NAME(S): AJOVY What should I tell my care team before I take this medication? They need to know if you have any of these conditions: An unusual or allergic reaction to fremanezumab, other medications, foods, dyes, or preservatives Pregnant or trying to get pregnant Breast-feeding How should I use this medication? This medication is injected under the skin. You will be taught how to prepare and give it. Take it as directed on the prescription label. Keep taking it unless your care team tells you to stop. It is important that you put your used needles and syringes in a special sharps container. Do not put them in a trash can. If you do not have a sharps container, call your pharmacist or care team to get one. Talk to your care team about the use of this medication in children. Special care may be needed. Overdosage: If you think you have taken too much of this medicine contact a poison control center or emergency room at once. NOTE: This medicine is only for you. Do not share this medicine with others. What if I miss a dose? If you miss a dose, take it as soon as you can. If it is almost time for your next dose, take only that dose. Do not take double or extra doses. What may interact with this medication? Interactions are not expected. This list may not describe all possible interactions. Give your health care provider a list of all the medicines, herbs, non-prescription drugs, or dietary  supplements you use. Also tell them if you smoke, drink alcohol, or use illegal drugs. Some items may interact with your medicine. What should I watch for while using this medication? Tell your care team if your symptoms do not start to get better or if they get worse. What side effects may I notice from receiving this medication? Side effects that you should report to your care team as soon as possible: Allergic reactions or angioedema--skin rash, itching or hives, swelling of the face, eyes, lips, tongue, arms, or legs, trouble swallowing or breathing Side effects that usually do not require medical attention (report to your care team if they continue or are bothersome): Pain, redness, or irritation at injection site This list may not describe all possible side effects. Call your doctor for medical advice about side effects. You may report side effects to FDA at 1-800-FDA-1088. Where should I keep my medication? Keep out of the reach of children and pets. Store in a refrigerator or at room temperature between 20 and 25 degrees C (68 and 77 degrees F). Refrigeration (preferred): Store in the refrigerator. Do not freeze. Keep  in the original container until you are ready to take it. Remove the dose from the carton about 30 minutes before it is time for you to use it. If the dose is not used, it may be stored in the original container at room temperature for 7 days. Get rid of any unused medication after the expiration date. Room Temperature: This medication may be stored at room temperature for up to 7 days. Keep it in the original container. Protect from light until time of use. If it is stored at room temperature, get rid of any unused medication after 7 days or after it expires, whichever is first. To get rid of medications that are no longer needed or have expired: Take the medication to a medication take-back program. Check with your pharmacy or law enforcement to find a location. If you cannot  return the medication, ask your pharmacist or care team how to get rid of this medication safely. NOTE: This sheet is a summary. It may not cover all possible information. If you have questions about this medicine, talk to your doctor, pharmacist, or health care provider.  2024 Elsevier/Gold Standard (2021-06-01 00:00:00)

## 2023-02-26 NOTE — Progress Notes (Signed)
[facility]    Provider:  Dr Lucia Gaskins Requesting Provider: Everrett Coombe, DO Primary Care Provider:  Everrett Coombe, DO  CC:  Migraines  HPI:  Gabriela Guerrero is a 62 y.o. female here as requested by Everrett Coombe, DO for migraines. has Uncontrolled type 2 diabetes mellitus with hyperglycemia, with long-term current use of insulin (HCC); Stage 3a chronic kidney disease (HCC); S/P lumbar spinal fusion; Persistent dyspnea after COVID-19; Moderate mitral regurgitation; MDD (major depressive disorder); Insomnia; Hypertension associated with diabetes (HCC); GERD (gastroesophageal reflux disease); Diabetic retinopathy (HCC); Failed back syndrome of lumbar spine; Breast pain, right; Right hip pain; Chronic pain of right knee; Tongue lesion; Decreased activities of daily living (ADL); and Migraine on their problem list.   Migraines started about a year ago. In the setting of several deaths in the family since Covid started. She is sitting int he dark today. She states she has a severe migraine. [place] family history of migraine but she had never had headaches as bad. In the back of the eyes, throbbing, nausea, light and sound sensitivity, hurts to move. She has tried multiple medications and have not helped. Been worsening over the last year. She has sumatriptan. She has not seen the eye doctor, she has an appointment. Right now her headache is severe. Headache been going on since yesterday, imitrex has not helped. When she starts moving in the morning she notices she has a headache worse standing up better laying down, positional. She feels she has blurry vision, she was referred to an eye doctor. Some neck pain muscular. MRi in May was for the headaches. But list of imaging sees headaches as far back as 2016 when she had  CT of the head. Having at least 20 headache days out of the month and at least 10 severe migraine days a month > 6 months. No other focal neurologic deficits, associated  symptoms, inciting events or modifiable factors.  Reviewed notes, labs and imaging from outside physicians, which showed:  Patient was established with [facility], last note January 24, 2023, I reviewed that note, at the time she was on Reglan, nortriptyline, sumatriptan, and the patient was reporting 12 days/month of headaches and she denied any "complicating neurologic features".  At that appointment she had been experiencing a migraine going on for multiple days, on nortriptyline and Imitrex, history of obstructive sleep apnea nonadherent to CPAP.  She was started on Ajovy at that appointment.  Medications tried > 3 months that can be used in migraine management include: Tramadol, Imitrex, Lyrica, nortriptyline, amlodipine (similar to verapamil), on carvedilol currently, on clonidine currently, gabapentin, losartan, Robaxin, Zofran, Zoloft, topiramate.  Also prescribed Ajovy January 24, 2023. She is on tramadol and Lyrica and Ozempic and she has constipation so Aimovig is contraindicated and she has to take laxatives.   CT of the head September 23, 2022 showed encephalomalacia in the paramedial left frontoparietal region consistent with chronic infarct, MRV in May 2024 showed old cortical infarcts in the anterior medial left frontal lobe and the posterior medial left parietal lobe, old lacunar infarcts in the left side of the thalamus and left caudate nucleus, but otherwise normal flow signal void in both the carotid arteries and the basilar artery, MRI of the head in May 2024 showed old cortical infarcts in the anterior medial left frontal lobe and the posterior medial left parietal lobe.  Old lacunar infarcts in the left side of the thalamus and left caudate nucleus.  Recent Results (from the past 2160  hour(s))  Comprehensive metabolic panel     Status: Abnormal   Collection Time: 12/11/22  8:58 AM  Result Value Ref Range   Glucose 348 (H) 70 - 99 mg/dL   BUN 15 8 - 27 mg/dL   Creatinine, Ser 4.09  (H) 0.57 - 1.00 mg/dL   eGFR 46 (L) >81 XB/JYN/8.29   BUN/Creatinine Ratio 11 (L) 12 - 28   Sodium 136 134 - 144 mmol/L   Potassium 4.7 3.5 - 5.2 mmol/L   Chloride 100 96 - 106 mmol/L   CO2 23 20 - 29 mmol/L   Calcium 9.4 8.7 - 10.3 mg/dL   Total Protein 7.0 6.0 - 8.5 g/dL   Albumin 3.6 (L) 3.9 - 4.9 g/dL   Globulin, Total 3.4 1.5 - 4.5 g/dL   Bilirubin Total 0.2 0.0 - 1.2 mg/dL   Alkaline Phosphatase 89 44 - 121 IU/L   AST 11 0 - 40 IU/L   ALT 16 0 - 32 IU/L  Direct LDL     Status: None   Collection Time: 12/11/22  8:58 AM  Result Value Ref Range   LDL Direct 79 0 - 99 mg/dL  Aldosterone + renin activity w/ ratio     Status: None   Collection Time: 12/11/22  8:58 AM  Result Value Ref Range   Aldosterone 10.9 0.0 - 30.0 ng/dL   Renin Activity, Plasma 0.415 0.167 - 5.380 ng/mL/hr   Aldos/Renin Ratio 26.3 0.0 - 30.0    Comment:                          Units:      ng/dL per ng/mL/hr  TSH     Status: None   Collection Time: 12/11/22  8:58 AM  Result Value Ref Range   TSH 3.370 0.450 - 4.500 uIU/mL  POCT HgB A1C     Status: Abnormal   Collection Time: 01/22/23  2:35 PM  Result Value Ref Range   Hemoglobin A1C     HbA1c POC (<> result, manual entry)     HbA1c, POC (prediabetic range)     HbA1c, POC (controlled diabetic range) 8.9 (A) 0.0 - 7.0 %      Review of Systems: Patient complains of symptoms per HPI as well as the following symptoms headache. Pertinent negatives and positives per HPI. All others negative.   Social History   Socioeconomic History   Marital status: Single    Spouse name: Not on file   Number of children: Not on file   Years of education: Not on file   Highest education level: Not on file  Occupational History   Occupation: Disabled  Tobacco Use   Smoking status: Never   Smokeless tobacco: Never  Vaping Use   Vaping status: Never Used  Substance and Sexual Activity   Alcohol use: Never   Drug use: Never   Sexual activity: Not Currently     Partners: Male    Birth control/protection: Abstinence  Other Topics Concern   Not on file  Social History Narrative   Not on file   Social Determinants of Health   Financial Resource Strain: High Risk (11/22/2022)   Overall Financial Resource Strain (CARDIA)    Difficulty of Paying Living Expenses: Hard  Food Insecurity: No Food Insecurity (10/28/2022)   Received from [facility]   Hunger Vital Sign    Worried About Running Out of Food in the Last Year: Never true  Ran Out of Food in the Last Year: Never true  Recent Concern: Food Insecurity - Food Insecurity Present (09/20/2022)   [facility], [facility]   Hunger Vital Sign    Worried About Running Out of Food in the Last Year: Sometimes true    Ran Out of Food in the Last Year: Never true  Transportation Needs: No Transportation Needs (11/21/2022)   PRAPARE - Administrator, Civil Service (Medical): No    Lack of Transportation (Non-Medical): No  Physical Activity: Unknown (02/22/2022)   Received from [facility], [facility]   Exercise Vital Sign    Days of Exercise per Week: 1 day    Minutes of Exercise per Session: Not on file  Stress: Stress Concern Present (09/20/2022)   Received from [facility], [facility] Occupational Health - Occupational Stress Questionnaire    Feeling of Stress : Very much  Social Connections: Somewhat Isolated (02/22/2022)   Received from [facility], [facility]   Social Network    How would you rate your social network (family, work, friends)?: Restricted participation with some degree of social isolation  Intimate Partner Violence: Not At Risk (12/11/2022)   Received from [facility]   HITS    Over the last 12 months how often did your partner physically hurt you?: 1    Over the last 12 months how often did your partner insult you or talk down to you?: 1    Over the last 12 months how often did your partner threaten you with  physical harm?: 1    Over the last 12 months how often did your partner scream or curse at you?: 1    Family History  Problem Relation Age of Onset   Hypertension Mother    Breast cancer Mother    Hypertension Sister        died of aneurysm from HTN   Stroke Sister    Migraines Neg Hx     Past Medical History:  Diagnosis Date   Hypertension     Patient Active Problem List   Diagnosis Date Noted   Migraine 09/24/2022   Decreased activities of daily living (ADL) 05/05/2022   Tongue lesion 03/25/2022   Right hip pain 01/02/2022   Chronic pain of right knee 01/02/2022   Breast pain, right 05/14/2021   Failed back syndrome of lumbar spine 08/15/2020   Persistent dyspnea after COVID-19 05/25/2020   Stage 3a chronic kidney disease (HCC) 07/30/2019   MDD (major depressive disorder) 07/12/2019   Moderate mitral regurgitation 06/24/2019   Insomnia 06/01/2018   S/P lumbar spinal fusion 09/01/2017   Uncontrolled type 2 diabetes mellitus with hyperglycemia, with long-term current use of insulin (HCC) 01/29/2017   Hypertension associated with diabetes (HCC) 01/26/2017   Diabetic retinopathy (HCC) 04/13/2013   GERD (gastroesophageal reflux disease) 07/23/2010    Past Surgical History:  Procedure Laterality Date   LUMBAR FUSION      Current Outpatient Medications  Medication Sig Dispense Refill   acetaminophen (TYLENOL) 650 MG CR tablet Take by mouth.     albuterol (VENTOLIN HFA) 108 (90 Base) MCG/ACT inhaler INHALE 2 PUFFS BY MOUTH EVERY 4 TO 6 HOURS AS NEEDED FOR WHEEZING 54 g 1   Alcohol Swabs (DROPSAFE ALCOHOL PREP) 70 % PADS Apply topically.     amLODipine-valsartan (EXFORGE) 10-320 MG tablet Take 1 tablet by mouth daily. 90 tablet 3   aspirin 81 MG EC tablet Take by  mouth.     atorvastatin (LIPITOR) 40 MG tablet Take 1 tablet (40 mg total) by mouth daily. 90 tablet 3   butalbital-acetaminophen-caffeine (FIORICET) 50-325-40 MG tablet Take 1 tablet by mouth every 6 (six)  hours as needed for headache. 14 tablet 0   carvedilol (COREG) 25 MG tablet TAKE 1 TABLET TWICE DAILY 180 tablet 3   cloNIDine (CATAPRES - DOSED IN MG/24 HR) 0.1 mg/24hr patch Place 1 patch (0.1 mg total) onto the skin once a week. 4 patch 11   diphenhydrAMINE (BENADRYL) 25 mg capsule Take by mouth.     furosemide (LASIX) 20 MG tablet Take 1 tablet (20 mg total) by mouth daily as needed. 90 tablet 3   glucose blood test strip Check sugars twice daily - pre and post prandially     LORazepam (ATIVAN) 0.5 MG tablet Take 1 tablet by mouth twice daily as needed 30 tablet 0   methocarbamol (ROBAXIN) 750 MG tablet TAKE 1 TABLET THREE TIMES DAILY AS NEEDED 90 tablet 11   mometasone-formoterol (DULERA) 100-5 MCG/ACT AERO Inhale 2 puffs into the lungs 2 (two) times daily. 13 g 6   nortriptyline (PAMELOR) 10 MG capsule Take 10 mg by mouth at bedtime.     NOVOLOG FLEXPEN 100 UNIT/ML FlexPen Inject into the skin.     nystatin (MYCOSTATIN/NYSTOP) powder Apply 1 Application topically 3 (three) times daily. 135 g 3   omeprazole (PRILOSEC) 40 MG capsule TAKE 1 CAPSULE EVERY DAY 30 capsule 0   ondansetron (ZOFRAN-ODT) 4 MG disintegrating tablet DISSOLVE 1 TABLET ON THE TONGUE EVERY 8 HOURS AS NEEDED FOR NAUSEA AND VOMITING 20 tablet 0   potassium chloride (KLOR-CON) 10 MEQ tablet Take 1 tablet (10 mEq total) by mouth daily. 90 tablet 1   pregabalin (LYRICA) 100 MG capsule Take 1 capsule (100 mg total) by mouth 2 (two) times daily. 180 capsule 3   Rimegepant Sulfate (NURTEC) 75 MG TBDP Take 1 tablet daily for 4 days. NDC [address]-4 LOT [id number] exp 08-2024     Semaglutide, 1 MG/DOSE, 4 MG/3ML SOPN Inject 1 mg as directed once a week. 3 mL 0   sertraline (ZOLOFT) 100 MG tablet Take 1.5 tablets (150 mg total) by mouth daily. 135 tablet 1   SUMAtriptan (IMITREX) 50 MG tablet Take 50 mg by mouth every 2 (two) hours as needed for migraine. May repeat in 2 hours if headache persists or recurs.     TOUJEO MAX SOLOSTAR 300  UNIT/ML Solostar Pen SMARTSIG:60 Unit(s) SUB-Q Daily     traMADol (ULTRAM) 50 MG tablet Take 1-2 tablets (50-100 mg total) by mouth every 6 (six) hours as needed. 30 tablet 2   AJOVY 225 MG/1.5ML SOAJ INJECT 225 MG INTO THE SKIN EVERY 30 DAYS. 2 mL 11   Galcanezumab-gnlm (EMGALITY) 120 MG/ML SOAJ Inject 120 mg into the skin every 30 (thirty) days. 1 mL 4   No current facility-administered medications for this visit.    Allergies as of 02/26/2023   (No Known Allergies)    Vitals: BP 134/72   Pulse 77   Ht 5\' 1"  (1.549 m)   Wt 258 lb 6.4 oz (117.2 kg)   SpO2 98%   BMI 48.82 kg/m  Last Weight:  Wt Readings from Last 1 Encounters:  02/26/23 258 lb 6.4 oz (117.2 kg)   Last Height:   Ht Readings from Last 1 Encounters:  02/26/23 5\' 1"  (1.549 m)     Physical exam: Exam: Gen: NAD, conversant, well nourised, obese, well  groomed                     CV: RRR, no MRG. No Carotid Bruits. No peripheral edema, warm, nontender Eyes: Conjunctivae clear without exudates or hemorrhage  Neuro: Detailed Neurologic Exam  Speech:    Speech is normal; fluent and spontaneous with normal comprehension.  Cognition:    The patient is oriented to person, place, and time;     recent and remote memory intact;     language fluent;     normal attention, concentration,     fund of knowledge Cranial Nerves:    The pupils are equal, round, and reactive to light. Cannot examin eyes due to photophobia Visual fields are full to finger confrontation. Extraocular movements are intact. Trigeminal sensation is intact and the muscles of mastication are normal. The face is symmetric. The palate elevates in the midline. Hearing intact. Voice is normal. Shoulder shrug is normal. The tongue has normal motion without fasciculations.   Coordination:    Normal finger to nose and heel to shin. Normal rapid alternating movements.   Gait:  Antalgic with a walker (back pain)  Motor Observation:    No asymmetry, no  atrophy, and no involuntary movements noted. Tone:    Normal muscle tone.    Posture:    Posture is normal. normal erect    Strength: Motor exam limited by pain but appears symmetrical     Sensation: intact to LT     Reflex Exam:  DTR's: Absent Ajs otherwise symmetrical Toes:    The toes are downgoing bilaterally.   Clonus:    Clonus is absent.    Assessment/Plan:  Gabriela Guerrero is a 62 y.o. female here as requested by Everrett Coombe, DO for migraines. has Uncontrolled type 2 diabetes mellitus with hyperglycemia, with long-term current use of insulin (HCC); Stage 3a chronic kidney disease (HCC); S/P lumbar spinal fusion; Persistent dyspnea after COVID-19; Moderate mitral regurgitation; MDD (major depressive disorder); Insomnia; Hypertension associated with diabetes (HCC); GERD (gastroesophageal reflux disease); Diabetic retinopathy (HCC); Failed back syndrome of lumbar spine; Breast pain, right; Right hip pain; Chronic pain of right knee; Tongue lesion; Decreased activities of daily living (ADL); and Migraine on their problem list.  DDx: Morbid obesity BMI 48, Chronic Migraines, Untreated sleep apnea contributing to headaches, strokes  Needs OSA treatment, dxed in 2021, send back to :  [facility]  [address], [address]  [place], [place] [address]  Phone: [phone]  Fax: [phone]   Need a follow up to evaluate strokes, need MRI brain on CD will order  Start Ajovy monthly injection - monthly injection for migraine prevention  Sleep Apnea - can cause headaches and strokes, please follow up and get cpap started!   Uncontrolled diabetes: f/u pcp     Orders Placed This Encounter  Procedures   CBC with Differential/Platelets   Comprehensive metabolic panel   Ambulatory referral to Sleep Studies   Meds ordered this encounter  Medications   DISCONTD: Fremanezumab-vfrm (AJOVY) 225 MG/1.5ML SOAJ    Sig: Inject 225 mg into the skin every 30 (thirty)  days.    Dispense:  1.5 mL    Refill:  11   Rimegepant Sulfate (NURTEC) 75 MG TBDP    Sig: Take 1 tablet daily for 4 days. [facility] [address]-1 LOT [id number] exp 08-2024    Cc: Everrett Coombe, DO,  Everrett Coombe, DO  Naomie Dean, MD  [facility] [phone]  [place], [place] [address]  Phone [phone] Fax [phone]  I spent over 60 minutes of face-to-face and non-face-to-face time with patient on the  1. Chronic migraine without aura without status migrainosus, not intractable   2. Sleep apnea, unspecified type   3. History of multiple strokes    diagnosis.  This included previsit chart review, lab review, study review, order entry, electronic health record documentation, patient education on the different diagnostic and therapeutic options, counseling and coordination of care, risks and benefits of management, compliance, or risk factor reduction

## 2023-02-27 MED ORDER — EMGALITY 120 MG/ML ~~LOC~~ SOAJ: 240.0000 mg | Freq: Once | SUBCUTANEOUS | 0 refills | Status: AC

## 2023-02-27 MED ORDER — EMGALITY 120 MG/ML ~~LOC~~ SOAJ: 120.0000 mg | SUBCUTANEOUS | 4 refills | Status: DC

## 2023-02-27 NOTE — Addendum Note (Signed)
Addended by: Raynald Kemp A on: 02/27/2023 07:40 AM   Modules accepted: Orders

## 2023-02-28 ENCOUNTER — Telehealth: Payer: Self-pay | Admitting: Neurology

## 2023-02-28 ENCOUNTER — Encounter: Payer: Self-pay | Admitting: Neurology

## 2023-02-28 NOTE — Telephone Encounter (Signed)
Patient had an MRI May 2024, I had her sign a release of records, please ensure I have the MRI before her January Appointment thanks

## 2023-03-04 ENCOUNTER — Telehealth: Payer: Self-pay | Admitting: Neurology

## 2023-03-04 NOTE — Telephone Encounter (Signed)
Referral for sleep study sent through Epic to [facility]. Phone: [phone]

## 2023-03-05 ENCOUNTER — Ambulatory Visit: Payer: Medicare HMO

## 2023-03-05 DIAGNOSIS — Z1231 Encounter for screening mammogram for malignant neoplasm of breast: Secondary | ICD-10-CM

## 2023-03-06 ENCOUNTER — Other Ambulatory Visit: Payer: Self-pay | Admitting: Family Medicine

## 2023-03-06 DIAGNOSIS — I152 Hypertension secondary to endocrine disorders: Secondary | ICD-10-CM

## 2023-03-17 ENCOUNTER — Telehealth: Payer: Self-pay | Admitting: *Deleted

## 2023-03-17 NOTE — Telephone Encounter (Addendum)
R/c report from [facility] waiting on cd. R/c cd records with the referral dept

## 2023-03-27 ENCOUNTER — Other Ambulatory Visit: Payer: Self-pay | Admitting: Family Medicine

## 2023-03-28 ENCOUNTER — Telehealth: Payer: Self-pay

## 2023-03-28 NOTE — Telephone Encounter (Signed)
Copied from CRM [id number]. Topic: Clinical - Medical Advice >> Mar 27, 2023  1:39 PM Amy B wrote: Reason for CRM: Patient would like to know if she needs a prescription for a diabetic monitor device.

## 2023-03-28 NOTE — Telephone Encounter (Signed)
New rx request.

## 2023-04-03 NOTE — Telephone Encounter (Signed)
LVM requesting pt call back with information.   Is she seeing Endocrinology? Which Endo doc? Did she request CGM from them?  If no, why?

## 2023-04-11 ENCOUNTER — Telehealth: Payer: Self-pay | Admitting: Family Medicine

## 2023-04-11 NOTE — Telephone Encounter (Signed)
Copied from CRM [id number]. Topic: Clinical - Medical Advice >> Apr 10, 2023  4:27 PM Shelah Lewandowsky wrote: Reason for CRM: Patient states she does not have a blood sugar monitor. [employer] needs prescription from endocrinologist.  She will talk to them about getting that taken care of. Can you help with this request?

## 2023-04-14 ENCOUNTER — Telehealth: Payer: Self-pay

## 2023-04-14 NOTE — Telephone Encounter (Signed)
Copied from CRM [id number]. Topic: Clinical - Medical Advice >> Apr 14, 2023 12:11 PM Gildardo Pounds wrote: Reason for CRM: Patient wants to know if she can go to the [facility] doctor in [place]. Patient's callback number is [phone]. Gave patient referral information.

## 2023-04-14 NOTE — Telephone Encounter (Signed)
Patient states [facility]  Will only accept appts online and she does  like - patient would like to speak with someone directly for scheduling  or at least a phone tree where she could speak with someone in person.  Can referral be done to another facility ?

## 2023-04-24 ENCOUNTER — Telehealth (HOSPITAL_COMMUNITY): Payer: Self-pay | Admitting: Licensed Clinical Social Worker

## 2023-04-24 NOTE — Telephone Encounter (Signed)
 Referral, clinical notes, demographics and copies of insurance cards have been faxed to [facility] at [phone]. Office will contact patient to schedule referral appointment.    [facility] [address] [place], [place] [address]   Local: [phone] Fax: [phone]

## 2023-04-24 NOTE — Telephone Encounter (Signed)
 H&V Care Navigation CSW Progress Note  Clinical Social Worker received call from pt reporting she lost her medicaid and not being sure what to do.  CSW confirmed in [facility] Tracks that she does not appear to have active coverage.  Provided her with [facility] number to call and speak to her Medicaid case worker to see what happened to her coverage.   SDOH Screenings   Food Insecurity: No Food Insecurity (10/28/2022)   Received from [facility]  Recent Concern: Food Insecurity - Food Insecurity Present (09/20/2022)   Received from [facility], [facility]  Housing: Medium Risk (11/22/2022)  Transportation Needs: No Transportation Needs (11/21/2022)  Utilities: Not At Risk (11/21/2022)  Recent Concern: Utilities - At Risk (09/20/2022)   Received from [facility], [facility]  Alcohol Screen: Low Risk  (11/21/2022)  Depression (PHQ2-9): High Risk (01/22/2023)  Financial Resource Strain: High Risk (11/22/2022)  Physical Activity: Unknown (02/22/2022)   Received from [facility], [facility]  Social Connections: Somewhat Isolated (02/22/2022)   Received from [facility], [facility]  Stress: Stress Concern Present (09/20/2022)   Received from [facility], [facility]  Tobacco Use: Low Risk  (02/28/2023)    Gabriela HILARIO Leech, LCSW Clinical Social Worker [facility] Desk#: [phone] Cell#: [phone]

## 2023-04-28 ENCOUNTER — Other Ambulatory Visit: Payer: Self-pay | Admitting: Family Medicine

## 2023-04-28 DIAGNOSIS — M961 Postlaminectomy syndrome, not elsewhere classified: Secondary | ICD-10-CM

## 2023-04-28 DIAGNOSIS — E782 Mixed hyperlipidemia: Secondary | ICD-10-CM

## 2023-04-29 ENCOUNTER — Institutional Professional Consult (permissible substitution): Payer: Medicare HMO | Admitting: Neurology

## 2023-04-29 MED ORDER — ATORVASTATIN CALCIUM 40 MG PO TABS: 40.0000 mg | ORAL_TABLET | Freq: Every day | ORAL | 3 refills | Status: DC

## 2023-05-01 MED ORDER — TRAMADOL HCL 50 MG PO TABS: 50.0000 mg | ORAL_TABLET | Freq: Four times a day (QID) | ORAL | 2 refills | Status: DC | PRN

## 2023-05-01 MED ORDER — PREGABALIN 100 MG PO CAPS: 100.0000 mg | ORAL_CAPSULE | Freq: Two times a day (BID) | ORAL | 3 refills | Status: DC

## 2023-05-24 ENCOUNTER — Other Ambulatory Visit: Payer: Self-pay | Admitting: Family Medicine

## 2023-05-24 DIAGNOSIS — E1159 Type 2 diabetes mellitus with other circulatory complications: Secondary | ICD-10-CM

## 2023-06-11 ENCOUNTER — Other Ambulatory Visit: Payer: Self-pay | Admitting: Family Medicine

## 2023-06-11 NOTE — Telephone Encounter (Signed)
 Copied from CRM [id number]. Topic: Clinical - Medication Refill >> Jun 11, 2023 11:07 AM Nila Nephew wrote: Most Recent Primary Care Visit:  Provider: Everrett Coombe  Department: [facility] CARE [facility]  Visit Type: OFFICE VISIT  Date: 01/22/2023  Medication: TOUJEO MAX SOLOSTAR 300 UNIT/ML Solostar Pen  Patient requesting all further medications be sent to [facility] on [address] in [place].   Has the patient contacted their pharmacy? No  Is this the correct pharmacy for this prescription? Yes  This is the patient's preferred pharmacy:   [facility] [address], [place] - [address] [place] [address] Phone: [phone] Fax: [phone]   Has the prescription been filled recently? No  Is the patient out of the medication? Yes  Has the patient been seen for an appointment in the last year OR does the patient have an upcoming appointment? Yes  Can we respond through MyChart? Yes  Agent: Please be advised that Rx refills may take up to 3 business days. We ask that you follow-up with your pharmacy.

## 2023-06-17 DIAGNOSIS — M79674 Pain in right toe(s): Secondary | ICD-10-CM | POA: Diagnosis not present

## 2023-06-17 DIAGNOSIS — B351 Tinea unguium: Secondary | ICD-10-CM | POA: Diagnosis not present

## 2023-06-17 DIAGNOSIS — M79675 Pain in left toe(s): Secondary | ICD-10-CM | POA: Diagnosis not present

## 2023-06-18 DIAGNOSIS — Z794 Long term (current) use of insulin: Secondary | ICD-10-CM | POA: Diagnosis not present

## 2023-06-18 DIAGNOSIS — E1165 Type 2 diabetes mellitus with hyperglycemia: Secondary | ICD-10-CM | POA: Diagnosis not present

## 2023-06-18 NOTE — Telephone Encounter (Signed)
 Attempted call to patient. Left a detailed voice mail message on listed home # ( allowed on DPR)

## 2023-06-19 ENCOUNTER — Telehealth: Payer: Self-pay | Admitting: Family Medicine

## 2023-06-19 NOTE — Telephone Encounter (Signed)
 Copied from CRM [id number]. Topic: Clinical - Prescription Issue >> Jun 19, 2023  9:47 AM Gabriela Guerrero wrote: Reason for CRM: Diabetic doctor, Gabriela Guerrero, has already filled medication TOUJEO MAX SOLOSTAR. The diabetic doctor will be the one to refill this medication.  Patient just wanted to make [place] aware that she would be getting her medication from her Endoc Doctor Return in about 3 months (around 09/16/2023) for Type 2 Diabetes.

## 2023-07-05 DIAGNOSIS — E785 Hyperlipidemia, unspecified: Secondary | ICD-10-CM | POA: Diagnosis not present

## 2023-07-05 DIAGNOSIS — K219 Gastro-esophageal reflux disease without esophagitis: Secondary | ICD-10-CM | POA: Diagnosis not present

## 2023-07-05 DIAGNOSIS — M25561 Pain in right knee: Secondary | ICD-10-CM | POA: Diagnosis not present

## 2023-07-05 DIAGNOSIS — E1159 Type 2 diabetes mellitus with other circulatory complications: Secondary | ICD-10-CM | POA: Diagnosis not present

## 2023-07-05 DIAGNOSIS — M545 Low back pain, unspecified: Secondary | ICD-10-CM | POA: Diagnosis not present

## 2023-07-05 DIAGNOSIS — E1122 Type 2 diabetes mellitus with diabetic chronic kidney disease: Secondary | ICD-10-CM | POA: Diagnosis not present

## 2023-07-05 DIAGNOSIS — I129 Hypertensive chronic kidney disease with stage 1 through stage 4 chronic kidney disease, or unspecified chronic kidney disease: Secondary | ICD-10-CM | POA: Diagnosis not present

## 2023-07-05 DIAGNOSIS — Z981 Arthrodesis status: Secondary | ICD-10-CM | POA: Diagnosis not present

## 2023-07-05 DIAGNOSIS — K149 Disease of tongue, unspecified: Secondary | ICD-10-CM | POA: Diagnosis not present

## 2023-07-05 DIAGNOSIS — K148 Other diseases of tongue: Secondary | ICD-10-CM | POA: Diagnosis not present

## 2023-07-05 DIAGNOSIS — N1831 Chronic kidney disease, stage 3a: Secondary | ICD-10-CM | POA: Diagnosis not present

## 2023-07-05 DIAGNOSIS — E1169 Type 2 diabetes mellitus with other specified complication: Secondary | ICD-10-CM | POA: Diagnosis not present

## 2023-07-07 ENCOUNTER — Telehealth: Payer: Self-pay

## 2023-07-07 NOTE — Transitions of Care (Post Inpatient/ED Visit) (Unsigned)
   07/07/2023  Name: Gabriela Guerrero MRN: [id number] DOB: 30-Dec-1960  Today's TOC FU Call Status: Today's TOC FU Call Status:: Unsuccessful Call (1st Attempt) Unsuccessful Call (1st Attempt) Date: 07/07/23  Attempted to reach the patient regarding the most recent Inpatient/ED visit.  Follow Up Plan: Additional outreach attempts will be made to reach the patient to complete the Transitions of Care (Post Inpatient/ED visit) call.   Signature Karena Addison, LPN [facility] Nurse Health Advisor Direct Dial [phone]

## 2023-07-10 NOTE — Transitions of Care (Post Inpatient/ED Visit) (Signed)
 07/10/2023  Name: Pinkey Guerrero MRN: [id number] DOB: Nov 14, 1960  Today's TOC FU Call Status: Today's TOC FU Call Status:: Successful TOC FU Call Completed Unsuccessful Call (1st Attempt) Date: 07/07/23 [facility] FU Call Complete Date: 07/10/23 Patient's Name and Date of Birth confirmed.  Transition Care Management Follow-up Telephone Call Date of Discharge: 07/05/23 Discharge Facility: Other ([facility]) Name of Other ([facility]) Discharge Facility: [facility] Type of Discharge: Emergency Department Reason for ED Visit: Other: (tongue lesion) How have you been since you were released from the hospital?: Same Any questions or concerns?: No  Items Reviewed: Did you receive and understand the discharge instructions provided?: Yes Medications obtained,verified, and reconciled?: Yes (Medications Reviewed) Any new allergies since your discharge?: No Dietary orders reviewed?: NA Do you have support at home?: No  Medications Reviewed Today: Medications Reviewed Today     Reviewed by Karena Addison, LPN (Licensed Practical Nurse) on 07/10/23 at 1525  Med List Status: <None>   Medication Order Taking? Sig Documenting Provider Last Dose Status Informant  acetaminophen (TYLENOL) 650 MG CR tablet [id number] No Take by mouth. [provider] Taking Active   AJOVY 225 MG/1.5ML SOAJ [id number]  INJECT 225 MG INTO THE SKIN EVERY 30 DAYS. Anson Fret, MD  Active   albuterol (VENTOLIN HFA) 108 (90 Base) MCG/ACT inhaler [id number] No INHALE 2 PUFFS BY MOUTH EVERY 4 TO 6 HOURS AS NEEDED FOR WHEEZING Everrett Coombe, DO Taking Active   Alcohol Swabs (DROPSAFE ALCOHOL PREP) 70 % PADS [id number] No Apply topically. [provider] Taking Active   amLODipine-valsartan (EXFORGE) 10-320 MG tablet [id number] No Take 1 tablet by mouth daily. Alver Sorrow, NP Taking Active   aspirin 81 MG EC tablet [id number] No Take by mouth. [provider] Taking Active   atorvastatin (LIPITOR)  40 MG tablet [id number]  Take 1 tablet (40 mg total) by mouth daily. Everrett Coombe, DO  Active   butalbital-acetaminophen-caffeine (FIORICET) [phone] MG tablet [id number] No Take 1 tablet by mouth every 6 (six) hours as needed for headache. Everrett Coombe, DO Taking Active   carvedilol (COREG) 25 MG tablet [id number] No TAKE 1 TABLET TWICE DAILY Everrett Coombe, DO Taking Active   cloNIDine (CATAPRES - DOSED IN MG/24 HR) 0.1 mg/24hr patch [id number] No Place 1 patch (0.1 mg total) onto the skin once a week. Alver Sorrow, NP Taking Active   diphenhydrAMINE (BENADRYL) 25 mg capsule [id number] No Take by mouth. [provider] Taking Active   furosemide (LASIX) 20 MG tablet [id number] No Take 1 tablet (20 mg total) by mouth daily as needed. Everrett Coombe, DO Taking Active   Galcanezumab-gnlm [facility]) 120 MG/ML Ivory Broad [id number]  Inject 120 mg into the skin every 30 (thirty) days. Anson Fret, MD  Active   glucose blood test strip [id number] No Check sugars twice daily - pre and post prandially [provider] Taking Active   LORazepam (ATIVAN) 0.5 MG tablet [id number] No Take 1 tablet by mouth twice daily as needed Everrett Coombe, DO Taking Active   methocarbamol (ROBAXIN) 750 MG tablet [id number] No TAKE 1 TABLET THREE TIMES DAILY AS NEEDED Everrett Coombe, DO Taking Active   mometasone-formoterol [facility]) 100-5 MCG/ACT AERO [id number] No Inhale 2 puffs into the lungs 2 (two) times daily. Everrett Coombe, DO Taking Active   nortriptyline (PAMELOR) 10 MG capsule [id number] No Take 10 mg by mouth at bedtime. [provider] Taking Active   NOVOLOG FLEXPEN 100 UNIT/ML FlexPen [id number] No Inject into the skin. [provider]  Taking Active   nystatin (MYCOSTATIN/NYSTOP) powder [id number] No Apply 1 Application topically 3 (three) times daily. Everrett Coombe, DO Taking Active   omeprazole (PRILOSEC) 40 MG capsule [id number] No TAKE 1 CAPSULE EVERY DAY Everrett Coombe, DO  Taking Active   ondansetron (ZOFRAN-ODT) 4 MG disintegrating tablet [id number] No DISSOLVE 1 TABLET ON THE TONGUE EVERY 8 HOURS AS NEEDED FOR NAUSEA AND VOMITING Everrett Coombe, DO Taking Active   potassium chloride (KLOR-CON) 10 MEQ tablet [id number]  TAKE 1 TABLET EVERY DAY Everrett Coombe, DO  Active   pregabalin (LYRICA) 100 MG capsule [id number]  Take 1 capsule (100 mg total) by mouth 2 (two) times daily. Everrett Coombe, DO  Active   Rimegepant Sulfate (NURTEC) 75 MG TBDP [id number]  Take 1 tablet daily for 4 days. NDC [address]-7 LOT [id number] exp 08-2024 Anson Fret, MD  Active   Semaglutide, 1 MG/DOSE, (OZEMPIC, 1 MG/DOSE,) 4 MG/3ML SOPN [id number]  INJECT 1MG  UNDER THE SKIN ONE TIME WEEKLY AS DIRECTED Everrett Coombe, DO  Active   sertraline (ZOLOFT) 100 MG tablet [id number] No Take 1.5 tablets (150 mg total) by mouth daily. Everrett Coombe, DO Taking Active   SUMAtriptan (IMITREX) 50 MG tablet [id number] No Take 50 mg by mouth every 2 (two) hours as needed for migraine. May repeat in 2 hours if headache persists or recurs. [provider] Taking Active   TOUJEO MAX SOLOSTAR 300 UNIT/ML Solostar Pen [id number] No SMARTSIG:60 Unit(s) SUB-Q Daily [provider] Taking Active   traMADol (ULTRAM) 50 MG tablet [id number]  Take 1-2 tablets (50-100 mg total) by mouth every 6 (six) hours as needed. Everrett Coombe, DO  Active             Home Care and Equipment/Supplies: Were Home Health Services Ordered?: NA Any new equipment or medical supplies ordered?: NA  Functional Questionnaire: Do you need assistance with bathing/showering or dressing?: No Do you need assistance with meal preparation?: No Do you need assistance with eating?: No Do you have difficulty maintaining continence: No Do you need assistance with getting out of bed/getting out of a chair/moving?: No Do you have difficulty managing or taking your medications?: No  Follow up appointments reviewed: PCP Follow-up  appointment confirmed?: NA Specialist Hospital Follow-up appointment confirmed?: No Reason Specialist Follow-Up Not Confirmed: Patient has Specialist Provider Number and will Call for Appointment Do you need transportation to your follow-up appointment?: No Do you understand care options if your condition(s) worsen?: Yes-patient verbalized understanding    SIGNATURE Karena Addison, LPN [facility] Nurse Health Advisor Direct Dial [phone]

## 2023-07-16 DIAGNOSIS — K1370 Unspecified lesions of oral mucosa: Secondary | ICD-10-CM | POA: Diagnosis not present

## 2023-07-16 DIAGNOSIS — D101 Benign neoplasm of tongue: Secondary | ICD-10-CM | POA: Diagnosis not present

## 2023-07-16 DIAGNOSIS — B370 Candidal stomatitis: Secondary | ICD-10-CM | POA: Diagnosis not present

## 2023-07-16 DIAGNOSIS — K148 Other diseases of tongue: Secondary | ICD-10-CM | POA: Diagnosis not present

## 2023-07-17 DIAGNOSIS — B351 Tinea unguium: Secondary | ICD-10-CM | POA: Diagnosis not present

## 2023-07-18 ENCOUNTER — Other Ambulatory Visit: Payer: Self-pay | Admitting: Family Medicine

## 2023-07-18 ENCOUNTER — Telehealth: Payer: Self-pay

## 2023-07-18 DIAGNOSIS — E1159 Type 2 diabetes mellitus with other circulatory complications: Secondary | ICD-10-CM

## 2023-07-18 NOTE — Telephone Encounter (Signed)
 Copied from CRM [id number]. Topic: Clinical - Medication Refill >> Jul 18, 2023  3:43 PM Macon Large wrote: Most Recent Primary Care Visit:  Provider: Everrett Coombe  Department: [facility]  Visit Type: OFFICE VISIT  Date: 01/22/2023  Medication: carvedilol (COREG) 25 MG tablet, methocarbamol (ROBAXIN) 750 MG tablet, and omeprazole (PRILOSEC) 40 MG capsule  Has the patient contacted their pharmacy? No  Is this the correct pharmacy for this prescription? Yes If no, delete pharmacy and type the correct one.  This is the patient's preferred pharmacy:  [facility] [address], [place] - [address] [place] [address] Phone: [phone] Fax: [phone]  Has the prescription been filled recently? No  Is the patient out of the medication? Yes  Has the patient been seen for an appointment in the last year OR does the patient have an upcoming appointment? Yes  Can we respond through MyChart? No  Agent: Please be advised that Rx refills may take up to 3 business days. We ask that you follow-up with your pharmacy.

## 2023-07-18 NOTE — Telephone Encounter (Signed)
 Copied from CRM [id number]. Topic: Clinical - Medication Question >> Jul 18, 2023  2:53 PM Shelah Lewandowsky wrote: Reason for CRM: Patient wants all Prescriptions except for Insulin to be sent to [facility]- Insulin to still go through [facility]

## 2023-07-18 NOTE — Telephone Encounter (Signed)
 Attempted call to patient.  Left detailed voice mail message ( allowed on DPR )  Asking if currently needing refills of any medications or was the message to just let us  know about pharmacy preferences? Requested a return call.

## 2023-07-18 NOTE — Telephone Encounter (Signed)
 Requesting rx rf  Carvedilol 25mg   Last written 02/10/2023 Should still have refills of this at pharmacy Methocarbamol 750mg   Last written 07/17/2022 Omeprazole 40mg   Last written 01/14/2023

## 2023-07-18 NOTE — Telephone Encounter (Signed)
 Copied from CRM [id number]. Topic: Clinical - Medication Refill >> Jul 18, 2023  2:48 PM Shelah Lewandowsky wrote: Most Recent Primary Care Visit:  Provider: Everrett Coombe  Department: [facility]  Visit Type: OFFICE VISIT  Date: 01/22/2023  Medication: methocarbamol (ROBAXIN) 750 MG tablet carvedilol (COREG) 25 MG tablet   Has the patient contacted their pharmacy? Yes (Agent: If no, request that the patient contact the pharmacy for the refill. If patient does not wish to contact the pharmacy document the reason why and proceed with request.) (Agent: If yes, when and what did the pharmacy advise?)  Is this the correct pharmacy for this prescription? Yes If no, delete pharmacy and type the correct one.  This is the patient's preferred pharmacy:    [facility] [address] [place] - [address] [place] [address] Phone: [phone] Fax: [phone]   Has the prescription been filled recently? Yes  Is the patient out of the medication? Yes  Has the patient been seen for an appointment in the last year OR does the patient have an upcoming appointment? Yes  Can we respond through MyChart? Yes  Agent: Please be advised that Rx refills may take up to 3 business days. We ask that you follow-up with your pharmacy.

## 2023-07-21 MED ORDER — OMEPRAZOLE 40 MG PO CPDR
40.0000 mg | DELAYED_RELEASE_CAPSULE | Freq: Every day | ORAL | 0 refills | Status: DC
Start: 2023-07-21 — End: 2023-09-04

## 2023-07-21 MED ORDER — METHOCARBAMOL 750 MG PO TABS: 750.0000 mg | ORAL_TABLET | Freq: Three times a day (TID) | ORAL | 11 refills | Status: AC | PRN

## 2023-07-24 ENCOUNTER — Other Ambulatory Visit: Payer: Self-pay | Admitting: Family Medicine

## 2023-07-24 DIAGNOSIS — K219 Gastro-esophageal reflux disease without esophagitis: Secondary | ICD-10-CM

## 2023-07-24 DIAGNOSIS — E1165 Type 2 diabetes mellitus with hyperglycemia: Secondary | ICD-10-CM

## 2023-07-24 NOTE — Telephone Encounter (Signed)
 Medication request received in rx request tab

## 2023-07-29 DIAGNOSIS — E113293 Type 2 diabetes mellitus with mild nonproliferative diabetic retinopathy without macular edema, bilateral: Secondary | ICD-10-CM | POA: Diagnosis not present

## 2023-07-29 DIAGNOSIS — H547 Unspecified visual loss: Secondary | ICD-10-CM | POA: Diagnosis not present

## 2023-08-04 DIAGNOSIS — E113593 Type 2 diabetes mellitus with proliferative diabetic retinopathy without macular edema, bilateral: Secondary | ICD-10-CM | POA: Diagnosis not present

## 2023-08-04 LAB — HM DIABETES EYE EXAM

## 2023-08-13 ENCOUNTER — Other Ambulatory Visit: Payer: Self-pay | Admitting: Family Medicine

## 2023-08-13 DIAGNOSIS — E1159 Type 2 diabetes mellitus with other circulatory complications: Secondary | ICD-10-CM

## 2023-08-14 NOTE — Telephone Encounter (Signed)
 Called patient and scheduled an appointment

## 2023-08-14 NOTE — Telephone Encounter (Signed)
 Pls contact the pt to schedule 87-month follow-up with Dr. Augustus Ledger. Thx

## 2023-08-20 ENCOUNTER — Encounter: Payer: Self-pay | Admitting: Family Medicine

## 2023-08-20 ENCOUNTER — Ambulatory Visit (INDEPENDENT_AMBULATORY_CARE_PROVIDER_SITE_OTHER): Admitting: Family Medicine

## 2023-08-20 VITALS — BP 171/93 | HR 87 | Ht 61.0 in | Wt 253.0 lb

## 2023-08-20 DIAGNOSIS — I152 Hypertension secondary to endocrine disorders: Secondary | ICD-10-CM

## 2023-08-20 DIAGNOSIS — M961 Postlaminectomy syndrome, not elsewhere classified: Secondary | ICD-10-CM

## 2023-08-20 DIAGNOSIS — F329 Major depressive disorder, single episode, unspecified: Secondary | ICD-10-CM | POA: Diagnosis not present

## 2023-08-20 DIAGNOSIS — E1159 Type 2 diabetes mellitus with other circulatory complications: Secondary | ICD-10-CM | POA: Diagnosis not present

## 2023-08-20 DIAGNOSIS — Z794 Long term (current) use of insulin: Secondary | ICD-10-CM

## 2023-08-20 DIAGNOSIS — G8929 Other chronic pain: Secondary | ICD-10-CM

## 2023-08-20 DIAGNOSIS — Z981 Arthrodesis status: Secondary | ICD-10-CM

## 2023-08-20 DIAGNOSIS — E1165 Type 2 diabetes mellitus with hyperglycemia: Secondary | ICD-10-CM

## 2023-08-20 DIAGNOSIS — M25561 Pain in right knee: Secondary | ICD-10-CM

## 2023-08-20 MED ORDER — CLONIDINE HCL 0.1 MG PO TABS: 0.1000 mg | ORAL_TABLET | Freq: Three times a day (TID) | ORAL | 1 refills | Status: DC

## 2023-08-20 NOTE — Assessment & Plan Note (Signed)
 She is seeing endocrinology.  Blood sugars are not well controlled.  Encouraged dietary improvement as well as working on increased activity.  Adding aquatic therapy to help with chronic pain and improve mobility.

## 2023-08-20 NOTE — Assessment & Plan Note (Signed)
 History of depression and anxiety.  Remains on sertraline  with lorazepam  on occasion.  Will plan to continue sertraline  at current strength.  Rare use of lorazepam 

## 2023-08-20 NOTE — Assessment & Plan Note (Signed)
 She continues to have significant disability related to her failed back pain syndrome as well as right-sided sciatica and neuropathy.  Doing better with Lyrica .  We can titrate this as needed and as tolerated.  Has tramadol  as needed as well.  She is having some increased pain in the R knee which I think may be OA related. Will have her schedule with Dr. Sandy Crumb for this.   Adding aquatic therapy to help with her mobility.

## 2023-08-20 NOTE — Assessment & Plan Note (Signed)
 BP is not well controlled. She has not used clonidine  patch in several months and is off of carvedilol  as well.  She will restart carvedilol .  Changing clonidine  to tablets.  Discussed importance of taking these as directed.  F/u in 2 weeks for BP check.

## 2023-08-20 NOTE — Progress Notes (Signed)
 Gabriela Guerrero - 63 y.o. female MRN [id number]  Date of birth: 1960/08/10  Subjective Chief Complaint  Patient presents with   Pain Management    HPI Gabriela Guerrero is a 63 y.o. female here today for follow up visit.   She reports that she is doing ok.   Continues to see endocrinology for management of diabetes.  Last visit was in February.  Her A1c at that time was 8.5%.  Continues on Ozempic , toujeo  and novolog .  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  She feels that current strength is working ok for her.  She denies side effects from this.   Her BP is treated with coreg , almodipine/valsartan  and clonidine .  She would prefer to change to clonidine  pill as the patch does not adhere to her very well.   She does see neurology for chronic migraines.  Treated with emgality  and  imitrex and/or nurtec as needed for breakthrough.   No Known Allergies  Past Medical History:  Diagnosis Date   Hypertension     Past Surgical History:  Procedure Laterality Date   LUMBAR FUSION      Social History   Socioeconomic History   Marital status: Single    Spouse name: Not on file   Number of children: Not on file   Years of education: Not on file   Highest education level: Not on file  Occupational History   Occupation: Disabled  Tobacco Use   Smoking status: Never   Smokeless tobacco: Never  Vaping Use   Vaping status: Never Used  Substance and Sexual Activity   Alcohol use: Never   Drug use: Never   Sexual activity: Not Currently    Partners: Male    Birth control/protection: Abstinence  Other Topics Concern   Not on file  Social History Narrative   Not on file   Social Drivers of Health   Financial Resource Strain: High Risk (11/22/2022)   Overall Financial Resource Strain (CARDIA)    Difficulty of Paying Living Expenses: Hard  Food Insecurity: Low Risk  (07/17/2023)   Received from [facility]   Hunger Vital Sign    Worried About  Running Out of Food in the Last Year: Never true    Ran Out of Food in the Last Year: Never true  Transportation Needs: No Transportation Needs (07/17/2023)   Received from [facility]    In the past 12 months, has lack of reliable transportation kept you from medical appointments, meetings, work or from getting things needed for daily living? : No  Physical Activity: Unknown (02/22/2022)   Received from [facility]   Exercise Vital Sign    Days of Exercise per Week: 1 day    Minutes of Exercise per Session: Not on file  Stress: Stress Concern Present (09/20/2022)   Received from [facility], [facility]   [employer] of Occupational Health - Occupational Stress Questionnaire    Feeling of Stress : Very much  Social Connections: Somewhat Isolated (02/22/2022)   Received from [facility]   Social Network    How would you rate your social network (family, work, friends)?: Restricted participation with some degree of social isolation    Family History  Problem Relation Age of Onset   Hypertension Mother    Breast cancer Mother    Hypertension Sister        died of aneurysm from HTN   Stroke  Sister    Migraines Neg Hx     Health Maintenance  Topic Date Due   Medicare Annual Wellness (AWV)  Never done   HIV Screening  Never done   Hepatitis C Screening  Never done   Cervical Cancer Screening (HPV/Pap Cotest)  Never done   Zoster Vaccines- Shingrix (1 of 2) Never done   Pneumococcal Vaccine 35-35 Years old (2 of 2 - PCV) 04/23/2019   COVID-19 Vaccine (3 - 2024-25 season) 12/22/2022   HEMOGLOBIN A1C  07/23/2023   Diabetic kidney evaluation - Urine ACR  09/24/2023   INFLUENZA VACCINE  11/21/2023   Diabetic kidney evaluation - eGFR measurement  12/11/2023   FOOT EXAM  01/22/2024   OPHTHALMOLOGY EXAM  08/03/2024   MAMMOGRAM  03/04/2025   DTaP/Tdap/Td (2 - Td or Tdap) 06/26/2025   Colonoscopy  01/31/2029   HPV VACCINES   Aged Out   Meningococcal B Vaccine  Aged Out     ----------------------------------------------------------------------------------------------------------------------------------------------------------------------------------------------------------------- Physical Exam BP (!) 182/94 (BP Location: Left Arm, Patient Position: Sitting, Cuff Size: Large)   Pulse 87   Ht 5\' 1"  (1.549 m)   Wt 253 lb (114.8 kg)   SpO2 99%   BMI 47.80 kg/m   Physical Exam Constitutional:      Appearance: Normal appearance.  HENT:     Head: Normocephalic and atraumatic.  Eyes:     General: No scleral icterus. Cardiovascular:     Rate and Rhythm: Normal rate and regular rhythm.  Pulmonary:     Effort: Pulmonary effort is normal.     Breath sounds: Normal breath sounds.  Neurological:     Mental Status: She is alert.     ------------------------------------------------------------------------------------------------------------------------------------------------------------------------------------------------------------------- Assessment and Plan  Uncontrolled type 2 diabetes mellitus with hyperglycemia, with long-term current use of insulin  [facility]) She is seeing endocrinology.  Blood sugars are not well controlled.  Encouraged dietary improvement as well as working on increased activity.  Adding aquatic therapy to help with chronic pain and improve mobility.   Hypertension associated with diabetes (HCC) BP is not well controlled. She has not used clonidine  patch in several months and is off of carvedilol  as well.  She will restart carvedilol .  Changing clonidine  to tablets.  Discussed importance of taking these as directed.  F/u in 2 weeks for BP check.   MDD (major depressive disorder) History of depression and anxiety.  Remains on sertraline  with lorazepam  on occasion.  Will plan to continue sertraline  at current strength.  Rare use of lorazepam   Failed back syndrome of lumbar spine She  continues to have significant disability related to her failed back pain syndrome as well as right-sided sciatica and neuropathy.  Doing better with Lyrica .  We can titrate this as needed and as tolerated.  Has tramadol  as needed as well.  She is having some increased pain in the R knee which I think may be OA related. Will have her schedule with Dr. Sandy Crumb for this.   Adding aquatic therapy to help with her mobility.    Meds ordered this encounter  Medications   cloNIDine  (CATAPRES ) 0.1 MG tablet    Sig: Take 1 tablet (0.1 mg total) by mouth 3 (three) times daily.    Dispense:  270 tablet    Refill:  1    Return in about 6 months (around 02/19/2024) for Type 2 Diabetes, Hypertension.

## 2023-08-26 ENCOUNTER — Telehealth: Payer: Self-pay

## 2023-08-26 NOTE — Telephone Encounter (Signed)
 Patient was identified as falling into the [place] Measure - Diabetes.   Patient was: Appointment already scheduled for:  02/19/2024.

## 2023-08-27 ENCOUNTER — Encounter (HOSPITAL_COMMUNITY): Payer: Self-pay

## 2023-08-28 ENCOUNTER — Ambulatory Visit (INDEPENDENT_AMBULATORY_CARE_PROVIDER_SITE_OTHER): Admitting: Sports Medicine

## 2023-08-28 DIAGNOSIS — M961 Postlaminectomy syndrome, not elsewhere classified: Secondary | ICD-10-CM

## 2023-08-28 DIAGNOSIS — Z794 Long term (current) use of insulin: Secondary | ICD-10-CM | POA: Diagnosis not present

## 2023-08-28 DIAGNOSIS — E1165 Type 2 diabetes mellitus with hyperglycemia: Secondary | ICD-10-CM | POA: Diagnosis not present

## 2023-08-28 DIAGNOSIS — F329 Major depressive disorder, single episode, unspecified: Secondary | ICD-10-CM | POA: Diagnosis not present

## 2023-08-28 MED ORDER — DULOXETINE HCL 30 MG PO CPEP: 30.0000 mg | ORAL_CAPSULE | Freq: Every day | ORAL | 3 refills | Status: DC

## 2023-08-28 MED ORDER — SERTRALINE HCL 50 MG PO TABS: ORAL_TABLET | ORAL | 0 refills | Status: DC

## 2023-08-28 MED ORDER — MOUNJARO 2.5 MG/0.5ML ~~LOC~~ SOAJ
2.5000 mg | SUBCUTANEOUS | 0 refills | Status: DC
Start: 2023-08-28 — End: 2023-09-29

## 2023-08-28 MED ORDER — ONDANSETRON 8 MG PO TBDP: 8.0000 mg | ORAL_TABLET | Freq: Three times a day (TID) | ORAL | 3 refills | Status: DC | PRN

## 2023-08-28 NOTE — Assessment & Plan Note (Signed)
 Major depression, uncontrolled, currently on sertraline  with occasional lorazepam . We will switch from sertraline  to Cymbalta.

## 2023-08-28 NOTE — Progress Notes (Addendum)
    Procedures performed today:    None.  Independent interpretation of notes and tests performed by another provider:   None.  Brief History, Exam, Impression, and Recommendations:    Failed back syndrome of lumbar spine 63 year old female with diabetes, morbid obesity, chronic low back pain, she is status post an L4-L5 fusion with persistent pain, she endorses she got no relief not even temporary. She does have a CT scan from 2 years ago that does show what appears to be a stable fusion, she has adjacent severe facet arthropathy, pain is better with flexion. She was started on Lyrica , she is on the maximum tolerated dose right now of 100 mg twice a day. She also gets tramadol  occasionally. She has done aquatic therapy for a few sessions but was unable to continue due to insurance, she has found another facility and she is going to let me know the name and of happy to place the referral. At this point she has failed greater than 6 weeks of physician directed conservative treatment including physical therapy, we will proceed with an updated MRI of the lumbar spine, I do think it is reasonable to try multilevel facet joint injections, likely right sided L3-L4 and right sided L5-S1. She also needs to lose about 100 pounds, I am going to switch her from Ozempic  to Mounjaro and she can do a steady up titration, she can follow this up with her PCP, my recommendation is to continue to the maximum tolerated dose. We will also switch her from sertraline  to Cymbalta for additional neuropathic efficacy.  Once we see her MRI results we will order the facet joint injections.  Patient called back, she is willing to do additional aquatic therapy, she will do this at [facility].  Uncontrolled type 2 diabetes mellitus with hyperglycemia, with long-term current use of insulin  (HCC) She also needs to lose about 100 pounds, I am going to switch her from Ozempic  to Mounjaro and she can do a steady up  titration, she can follow this up with her PCP, my recommendation is to continue to the maximum tolerated dose. For insurance coverage purposes she did not get efficacy and did not tolerate Ozempic .  MDD (major depressive disorder) Major depression, uncontrolled, currently on sertraline  with occasional lorazepam . We will switch from sertraline  to Cymbalta.  I spent 40 minutes of total time managing this patient today, this includes chart review, face to face, and non-face to face time.  ____________________________________________ Joselyn Nicely. Sandy Crumb, M.D., ABFM., CAQSM., AME. Primary Care and Sports Medicine [facility]  Adjunct Professor of [facility] Medicine  [employer]  Restaurant manager, fast food

## 2023-08-28 NOTE — Assessment & Plan Note (Signed)
 She also needs to lose about 100 pounds, I am going to switch her from Ozempic  to Mounjaro and she can do a steady up titration, she can follow this up with her PCP, my recommendation is to continue to the maximum tolerated dose. For insurance coverage purposes she did not get efficacy and did not tolerate Ozempic .

## 2023-08-28 NOTE — Assessment & Plan Note (Addendum)
 63 year old female with diabetes, morbid obesity, chronic low back pain, she is status post an L4-L5 fusion with persistent pain, she endorses she got no relief not even temporary. She does have a CT scan from 2 years ago that does show what appears to be a stable fusion, she has adjacent severe facet arthropathy, pain is better with flexion. She was started on Lyrica , she is on the maximum tolerated dose right now of 100 mg twice a day. She also gets tramadol  occasionally. She has done aquatic therapy for a few sessions but was unable to continue due to insurance, she has found another facility and she is going to let me know the name and of happy to place the referral. At this point she has failed greater than 6 weeks of physician directed conservative treatment including physical therapy, we will proceed with an updated MRI of the lumbar spine, I do think it is reasonable to try multilevel facet joint injections, likely right sided L3-L4 and right sided L5-S1. She also needs to lose about 100 pounds, I am going to switch her from Ozempic  to Mounjaro and she can do a steady up titration, she can follow this up with her PCP, my recommendation is to continue to the maximum tolerated dose. We will also switch her from sertraline  to Cymbalta for additional neuropathic efficacy.  Once we see her MRI results we will order the facet joint injections.  Patient called back, she is willing to do additional aquatic therapy, she will do this at [facility].

## 2023-08-28 NOTE — Addendum Note (Signed)
 Addended by: Gean Keels on: 08/28/2023 11:54 AM   Modules accepted: Orders

## 2023-08-29 ENCOUNTER — Telehealth: Payer: Self-pay | Admitting: *Deleted

## 2023-08-29 NOTE — Progress Notes (Unsigned)
 Care Guide Pharmacy Note  08/29/2023 Name: Gabriela Guerrero MRN: [id number] DOB: [birth year]  Referred By: Gabriela Holter, DO Reason for referral: Complex Care Management and Call Attempt #1 (Outreach to schedule referral with pharmacist )   Gabriela Guerrero is a 63 y.o. year old female who is a primary care patient of Gabriela Holter, DO.  Gabriela Guerrero was referred to the pharmacist for assistance related to: Depression  An unsuccessful telephone outreach was attempted today to contact the patient who was referred to the pharmacy team for assistance with medication management. Additional attempts will be made to contact the patient.  Gabriela Guerrero, CMA [place]  [facility] Guide Direct Dial : [phone]  Fax: [phone] Website: [email or web address]

## 2023-09-01 ENCOUNTER — Telehealth: Payer: Self-pay

## 2023-09-01 NOTE — Telephone Encounter (Signed)
Spoke to patient, thanks

## 2023-09-01 NOTE — Progress Notes (Signed)
 Care Guide Pharmacy Note  09/01/2023 Name: Gabriela Guerrero MRN: [id number] DOB: 1960/10/28  Referred By: Adela Holter, DO Reason for referral: Complex Care Management and Call Attempt #1 (Outreach to schedule referral with pharmacist )   Gabriela Guerrero is a 63 y.o. year old female who is a primary care patient of Adela Holter, DO.  Gabriela Guerrero was referred to the pharmacist for assistance related to: Depression  Successful contact was made with the patient to discuss pharmacy services including being ready for the pharmacist to call at least 5 minutes before the scheduled appointment time and to have medication bottles and any blood pressure readings ready for review. The patient agreed to meet with the pharmacist via telephone visit on 5/22/20225  Kandis Ormond, CMA [place]  [facility], [facility] Guide Direct Dial : [phone]  Fax: [phone] Website: [email or web address]

## 2023-09-01 NOTE — Telephone Encounter (Signed)
 Copied from CRM [id number]. Topic: Referral - Status >> Sep 01, 2023  2:08 PM Shelby Dessert H wrote: Reason for CRM: Patient called and stated that someone called and was trying to get her an appointment for her PT and patient states that she does not want to go to this place, patient said she is wanting to go to the [facility] [place], patients callback number is [phone], if the provider says no she wants to try another place.

## 2023-09-03 ENCOUNTER — Ambulatory Visit

## 2023-09-04 ENCOUNTER — Other Ambulatory Visit: Payer: Self-pay | Admitting: Family Medicine

## 2023-09-11 ENCOUNTER — Other Ambulatory Visit: Payer: Self-pay

## 2023-09-12 NOTE — Progress Notes (Signed)
 09/12/2023 Name: Manuel Lawhead MRN: [id number] DOB: [birth year]  Chief Complaint  Patient presents with   Medication Management   Raevyn Sokol is a 63 y.o. year old female who presented for a telephone visit.   They were referred to the pharmacist by their PCP for assistance in managing complex medication management.   Subjective:  Care Team: Primary Care Provider: Adela Holter, DO ; Next Scheduled Visit: 6/18  Medication Access/Adherence  Current Pharmacy:  [facility] [address] [place] - [address] [place] [address] Phone: [phone] Fax: [phone]  -Patient reports affordability concerns with their medications: No  -Patient reports access/transportation concerns to their pharmacy: No  -Patient reports adherence concerns with their medications:  Yes  has been out of Toujeo  approximately 5 days  Diabetes: Current medications: novolog  12 units TID cf, Mounjaro 2.5mg  weekly, Toujeo  60 units daily -Medications tried in the past: Ozempic   -Recently prescribed Mounjaro 2.5mg  weekly and took 1st dose Sunday but is unsure if administered appropriately -Has been out of Toujeo  5+ days -Current glucose readings: averaging 200 -Using True Metrix meter; testing 1-2 times daily -Last A1c was 8.5% 06/18/2023  Hypertension: Current medications: amlodipine /valsartan  10/320mg  daily, carvedilol  25mg  BID, furosemide  20mg  daily PRN, clonidine  0.1mg  TID -Patient does not have a validated, automated, upper arm home BP cuff -Current blood pressure readings readings: last OV reading 171/93  Hyperlipidemia/ASCVD Risk Reduction Current lipid lowering medications: atorvastatin  40mg  daily Antiplatelet regimen: ASA 81mg  daily  Medication Management -Patient recently started on duloxetine  and should be weaning sertraline - 50mg  prescription sent to pharmacy with directions to take 2 tablets daily for 1 week, then increase to 1 tablet daily for 1  week, then stop.  Patient did not get sertraline  50mg  prescription and has continued to take 150mg  daily -Requesting a refill on tramadol   Objective:  Lab Results  Component Value Date   HGBA1C 8.9 (A) 01/22/2023   Lab Results  Component Value Date   CREATININE 1.33 (H) 12/11/2022   BUN 15 12/11/2022   NA 136 12/11/2022   K 4.7 12/11/2022   CL 100 12/11/2022   CO2 23 12/11/2022   Lab Results  Component Value Date   LDLDIRECT 79 12/11/2022   Medications Reviewed Today     Reviewed by Linn Rich, RPH (Pharmacist) on 09/11/23 at 1148  Med List Status: <None>   Medication Order Taking? Sig Documenting Provider Last Dose Status Informant  acetaminophen (TYLENOL) 650 MG CR tablet [id number] Yes Take by mouth. [provider] Taking Active   Patient not taking:  Discontinued 09/11/23 1008 (Patient Preference)   albuterol  (VENTOLIN  HFA) 108 (90 Base) MCG/ACT inhaler [id number] Yes INHALE 2 PUFFS BY MOUTH EVERY 4 TO 6 HOURS AS NEEDED FOR WHEEZING Adela Holter, DO Taking Active   Alcohol Swabs (DROPSAFE ALCOHOL PREP) 70 % PADS [id number] Yes Apply topically. [provider] Taking Active   amLODipine -valsartan  (EXFORGE ) 10-320 MG tablet [id number] Yes Take 1 tablet by mouth daily. Clearnce Curia, NP Taking Active   aspirin 81 MG EC tablet [id number] Yes Take by mouth. [provider] Taking Active   atorvastatin  (LIPITOR) 40 MG tablet [id number] Yes Take 1 tablet (40 mg total) by mouth daily. Adela Holter, DO Taking Active   Blood Glucose Monitoring Suppl (RELION TRUE MET AIR GLUC METER) w/Device KIT [id number] Yes  [provider] Taking Active   butalbital -acetaminophen-caffeine  (FIORICET) 50-325-40 MG tablet [id number] Yes Take 1 tablet by mouth every 6 (six) hours as  needed for headache. Adela Holter, DO Taking Active   carvedilol  (COREG ) 25 MG tablet [id number] Yes Take 1 tablet by mouth twice daily Adela Holter, DO Taking Active   cloNIDine   (CATAPRES ) 0.1 MG tablet [id number] Yes Take 1 tablet (0.1 mg total) by mouth 3 (three) times daily. Adela Holter, DO Taking Active   diphenhydrAMINE  (BENADRYL ) 25 mg capsule [id number] Yes Take by mouth. [provider] Taking Active            Med Note Finley Hugh, Markus Casten A   Thu Sep 11, 2023  9:41 AM) As needed  DULoxetine  (CYMBALTA ) 30 MG capsule [id number] Yes Take 1 capsule (30 mg total) by mouth daily. Gean Keels, MD Taking Active   furosemide  (LASIX ) 20 MG tablet [id number] Yes Take 1 tablet (20 mg total) by mouth daily as needed. Adela Holter, DO Taking Active    Discontinued 09/11/23 1001   glucose blood test strip [id number] Yes Check sugars twice daily - pre and post prandially [provider] Taking Active   LORazepam  (ATIVAN ) 0.5 MG tablet [id number] Yes Take 1 tablet by mouth twice daily as needed Adela Holter, DO Taking Active   METAMUCIL FIBER PO [id number] Yes Take by mouth. As needed [provider] Taking Active   methocarbamol  (ROBAXIN ) 750 MG tablet [id number] Yes Take 1 tablet (750 mg total) by mouth 3 (three) times daily as needed. Adela Holter, DO Taking Active   Patient not taking:  Discontinued 09/11/23 0947 (Patient Preference)   nortriptyline (PAMELOR) 10 MG capsule [id number] Yes Take 10 mg by mouth at bedtime. [provider] Taking Active   NOVOLOG  FLEXPEN 100 UNIT/ML FlexPen [id number] Yes Inject 12 Units into the skin 3 (three) times daily with meals. [provider] Taking Active   nystatin  (MYCOSTATIN /NYSTOP ) powder [id number] Yes Apply 1 Application topically 3 (three) times daily. Adela Holter, DO Taking Active   omeprazole  [facility]) 40 MG capsule [id number] Yes Take 1 capsule by mouth once daily Adela Holter, DO Taking Active   ondansetron  (ZOFRAN -ODT) 8 MG disintegrating tablet [id number] Yes Take 1 tablet (8 mg total) by mouth every 8 (eight) hours as needed for nausea. Gean Keels, MD Taking  Active   polyethylene glycol powder [facility]) 17 GM/SCOOP powder [id number] Yes Take 17 g by mouth once. As needed [provider] Taking Active   potassium chloride  (KLOR-CON ) 10 MEQ tablet [id number] Yes TAKE 1 TABLET EVERY DAY Matthews, Cody, DO Taking Active   pregabalin  (LYRICA ) 100 MG capsule [id number] Yes Take 1 capsule (100 mg total) by mouth 2 (two) times daily. Adela Holter, DO Taking Active   Patient not taking:  Discontinued 09/11/23 1001   sertraline  (ZOLOFT ) 50 MG tablet [id number] Yes 2 tabs daily for a week then 1 tab daily for a week then stop Gean Keels, MD Taking Active   SUMAtriptan (IMITREX) 50 MG tablet [id number] Yes Take 50 mg by mouth every 2 (two) hours as needed for migraine. May repeat in 2 hours if headache persists or recurs. [provider] Taking Active   tirzepatide (MOUNJARO) 2.5 MG/0.5ML Pen [id number] Yes Inject 2.5 mg into the skin once a week. Gean Keels, MD Taking Active   TOUJEO  MAX SOLOSTAR 300 UNIT/ML Solostar Pen [id number] No SMARTSIG:60 Unit(s) SUB-Q Daily  Patient not taking: Reported on 09/11/2023   [provider] Not Taking Active   traMADol  (ULTRAM ) 50 MG tablet [id number] Yes Take 1-2 tablets (50-100 mg total) by mouth every 6 (six) hours as needed. Augustus Ledger,  Aletha Hutching, DO Taking Active            Assessment/Plan:   Diabetes: -Currently uncontrolled -Pharmacy received a refill for Toujeo , but had to order the medication- it should be ready for pick up later today.  -Patient will bring Mounjaro to pharmacy when picking up Toujeo  to get administration counseling -Continue current regimen at this time; goal to titrate Mounjaro in an effort to decrease insulin  dosing -Continue to monitor and record FBG -Sees Dr. Augustus Ledger again/18 and will be due for A1c -If no contraindications present (A1c <9, no ketones in urine, no frequent UTI's or genital yeast infections), patient could benefit from SGLT2  to lower A1c, protect kidneys, and possibly lower BP some  Hypertension: -Currently uncontrolled -Recommend patient monitor and record home BP daily -If consistently >130/80, I recommend addition of thiazide diuretic, such as chlorthalidone 25mg  daily- rechecking BMP and BP in 2 weeks  Hyperlipidemia/ASCVD Risk Reduction: -Currently uncontrolled with LDL >70 -Recommend follow-up lipid panel; if LDL remains >70, increase atorvastatin  to 80mg  daily  Medication Management -Patient had refills on file for tramadol , so pharmacy is filling for patient -Pharmacy is also filling sertraline  50mg  with weaning directions  Follow Up Plan: 6/12 and will then send all recs to PCP before 6/18 f/u  Linn Rich, PharmD, DPLA

## 2023-09-26 ENCOUNTER — Other Ambulatory Visit: Payer: Self-pay | Admitting: Sports Medicine

## 2023-09-26 DIAGNOSIS — E1165 Type 2 diabetes mellitus with hyperglycemia: Secondary | ICD-10-CM

## 2023-09-28 ENCOUNTER — Other Ambulatory Visit: Payer: Self-pay | Admitting: Family Medicine

## 2023-10-02 ENCOUNTER — Other Ambulatory Visit: Payer: Self-pay

## 2023-10-02 NOTE — Progress Notes (Signed)
   10/02/2023  Patient ID: Carleen Chary, female   DOB: 25-Jan-1961, 63 y.o.   MRN: [id number]  Subjective/Objective Telephone visit to follow-up on management of chronic conditions  Diabetes: Current medications: novolog  12 units TID cf, Mounjaro 2.5mg  weekly, Toujeo  60 units daily -Medications tried in the past: Ozempic   -Patient has completed 4 weeks of Mounjaro 2.5mg  and endorses she is tolerating well; had some mild nausea in the beginning, but this has resolved.  Patient has refill of Mounjaro 2.5mg  ready at pharmacy that she plans to pick up today. -FBG remains elevated with readings 160-200 -Using True Metrix meter; testing 1-2 times daily -Last A1c was 8.5% 06/18/2023   Hypertension: Current medications: amlodipine /valsartan  10/320mg  daily, carvedilol  25mg  BID, furosemide  20mg  daily PRN, clonidine  0.1mg  TID -Patient does have a validated, automated, upper arm home BP cuff but has not been monitoring BP regularly -Current blood pressure readings readings: last OV reading 171/93   Hyperlipidemia/ASCVD Risk Reduction Current lipid lowering medications: atorvastatin  40mg  daily Antiplatelet regimen: ASA 81mg  daily   Medication Management -Patient has now weaned off of sertraline  and is taking duloxetine  30mg  daily for MDD  Assessment/Plan:    Diabetes: -Currently uncontrolled -I recommend completing another 4 weeks at Mounjaro 2.5mg  week, then increasing to 5mg  weekly -Sees Dr. Augustus Ledger again 6/18 and will be due for A1c -If no contraindications present (A1c <9, no ketones in urine, no frequent UTI's or genital yeast infections), patient could benefit from SGLT2 to lower A1c, protect kidneys, and possibly lower BP some   Hypertension: -Currently uncontrolled -Recommend patient monitor and record home BP at least 3x/week-advised patient to bring readings to upcoming visit with Dr. Augustus Ledger -If consistently >130/80, I recommend addition of thiazide diuretic, such as chlorthalidone  25mg  daily- rechecking BMP and BP in 2 weeks   Hyperlipidemia/ASCVD Risk Reduction: -Currently uncontrolled with LDL >70 -Recommend follow-up lipid panel; if LDL remains >70, increase atorvastatin  to 80mg  daily   Medication Management -Continue current regimen   Follow Up Plan: 7/17   Linn Rich, PharmD, DPLA

## 2023-10-08 ENCOUNTER — Ambulatory Visit (HOSPITAL_BASED_OUTPATIENT_CLINIC_OR_DEPARTMENT_OTHER): Payer: Medicare (Managed Care) | Attending: Sports Medicine | Admitting: Physical Therapy

## 2023-10-08 DIAGNOSIS — R2689 Other abnormalities of gait and mobility: Secondary | ICD-10-CM | POA: Insufficient documentation

## 2023-10-08 DIAGNOSIS — M961 Postlaminectomy syndrome, not elsewhere classified: Secondary | ICD-10-CM | POA: Diagnosis not present

## 2023-10-08 DIAGNOSIS — M5459 Other low back pain: Secondary | ICD-10-CM | POA: Diagnosis not present

## 2023-10-08 DIAGNOSIS — Z7689 Persons encountering health services in other specified circumstances: Secondary | ICD-10-CM | POA: Diagnosis not present

## 2023-10-08 DIAGNOSIS — M6281 Muscle weakness (generalized): Secondary | ICD-10-CM | POA: Insufficient documentation

## 2023-10-08 NOTE — Therapy (Signed)
 OUTPATIENT PHYSICAL THERAPY THORACOLUMBAR EVALUATION   Patient Name: Gabriela Guerrero MRN: [id number] DOB:[birth year], 63 y.o., female Today's Date: 10/09/2023  END OF SESSION:  PT End of Session - 10/08/23 0935     Visit Number 1    Number of Visits 20    Date for PT Re-Evaluation 12/19/23    Authorization Type medicare    PT Start Time 0933    PT Stop Time 1010    PT Time Calculation (min) 37 min    Activity Tolerance Patient limited by pain    Behavior During Therapy [facility] for tasks assessed/performed          Past Medical History:  Diagnosis Date   Hypertension    Past Surgical History:  Procedure Laterality Date   LUMBAR FUSION     Patient Active Problem List   Diagnosis Date Noted   Migraine 09/24/2022   Decreased activities of daily living (ADL) 05/05/2022   Tongue lesion 03/25/2022   Right hip pain 01/02/2022   Chronic pain of right knee 01/02/2022   Breast pain, right 05/14/2021   Failed back syndrome of lumbar spine 08/15/2020   Persistent dyspnea after COVID-19 05/25/2020   Stage 3a chronic kidney disease (HCC) 07/30/2019   MDD (major depressive disorder) 07/12/2019   Moderate mitral regurgitation 06/24/2019   Insomnia 06/01/2018   S/P lumbar spinal fusion 09/01/2017   Uncontrolled type 2 diabetes mellitus with hyperglycemia, with long-term current use of insulin  (HCC) 01/29/2017   Hypertension associated with diabetes (HCC) 01/26/2017   Diabetic retinopathy (HCC) 04/13/2013   GERD (gastroesophageal reflux disease) 07/23/2010    PCP: Adela Holter DO  REFERRING PROVIDER: Gean Keels, MD   REFERRING DIAG: M96.1 (ICD-10-CM) - Failed back syndrome of lumbar spine   Rationale for Evaluation and Treatment: Rehabilitation  THERAPY DIAG:  Other low back pain  Muscle weakness (generalized)  Other abnormalities of gait and mobility  ONSET DATE: Chronic  SUBJECTIVE:                                                                                                                                                                                            SUBJECTIVE STATEMENT: Lumbar fusion 2019. No pain improvement.  Oa knee.Has had aquatic therapy in past. Had injections in past that did not work.  Pt reports being in bed most of time as that is where I feel the best.  Had Home care earlier this year and have been doing the exercises they gave me using bands, exercises standing.  Hurts but feels good. Pain is constant and high.  PERTINENT HISTORY:  As per WU:[id number] spondylosis  Failed  back syndrome of lumbar spine:63 year old female with diabetes, morbid obesity, chronic low back pain, she is status post an L4-L5 fusion with persistent pain, she endorses she got no relief not even temporary. She does have a CT scan from 2 years ago that does show what appears to be a stable fusion, she has adjacent severe facet arthropathy, pain is better with flexion. . . . multilevel facet joint injections, likely right sided L3-L4 and right sided L5-S1.     PAIN:  Are you having pain? Yes: NPRS scale: current 10/10 Pain location: R LB with radiation into toes Pain description: sharp pain to the touch, ache Aggravating factors: car riding, walking, standing, transfers Relieving factors: meds; sitting or lying down with legs elevated  PRECAUTIONS: Fall  RED FLAGS: None   WEIGHT BEARING RESTRICTIONS: No  FALLS:  Has patient fallen in last 6 months? 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  DIAGNOSTIC FINDINGS:  None  recent  PATIENT SURVEYS:  Modified Oswestry 38/50=76%   COGNITION: Overall cognitive status: Within functional limits for tasks assessed     SENSATION: Radicular pain into rle>Left to feet   POSTURE: decreased lumbar lordosis and flexed trunk   PALPATION: Moderate TTP Lumbar spine to hips Hyperesthesia throughout rle  LUMBAR ROM:   Limited by at least 75%   LOWER EXTREMITY MMT:    MMT Right eval Left eval  Hip flexion 2+P! 3P!  Hip extension    Hip abduction    Hip adduction    Hip internal rotation    Hip external rotation    Knee flexion 3 3  Knee extension 3 3  Ankle dorsiflexion    Ankle plantarflexion    Ankle inversion    Ankle eversion     (Blank rows = not tested)   FUNCTIONAL TESTS:  2 minute WT:58ft using rollator  GAIT: Distance walked: 300 ft to setting 2 standing rest periods Assistive device utilized: Walker - 4 wheeled Level of assistance: SBA Comments: antalgic, dragging heels, guarded posture, leaning on forearms  TREATMENT  Eval Self care:Posture and Optometrist instruction. Rollator vs cane vs wc. Safety with transfers in home; safety with car transfers                                                                                                                                 PATIENT EDUCATION:  Education details: Discussed eval findings, rehab rationale, aquatic program progression/POC and pools in area. Patient is in agreement  Person educated: Patient Education method: Explanation Education comprehension: verbalized understanding  HOME EXERCISE PROGRAM: TBA  ASSESSMENT:  CLINICAL IMPRESSION: Patient is a 63 y.o. f who was seen today for physical therapy evaluation and treatment for failed Back Syndrome of Lumbar spine. She presents by herself using transportation services to get here.  She does walk back to setting but with difficulty needing to rest at least x 2 pushing rollator.  She reports significant pain for past  several years following an unsuccessful lumbar surgery.  She is highly pain sensitive with all movement as well as exhibiting symptoms of hyperesthesia throughout rle.  She was limited due to pain with assessment today.  She is a good candidate for skilled PT with concentration initially in aquatics.  Will trial 1 x a week to build tolerance to level of activity required to participate (transportation, distance to and from setting, changing clothes and exercise) then increase to 2 x week.  She is extremely limited in all functional mobility and ADL's due to deficits. She is a good candidate for aquatic intervention and will benefit from the properties of water to progress towards functional goals. She is agreement with POC. Will need lift for entrance/exit to pool  OBJECTIVE IMPAIRMENTS: Abnormal gait, decreased activity tolerance, decreased balance, decreased endurance, decreased mobility, difficulty walking, decreased ROM, decreased strength, increased muscle spasms, impaired sensation, postural dysfunction, obesity, and pain.   ACTIVITY LIMITATIONS: carrying, lifting, bending, sitting, standing, squatting, sleeping, stairs, transfers, bed mobility, bathing, dressing, hygiene/grooming, locomotion level, and caring for others  PARTICIPATION LIMITATIONS: meal prep, cleaning, laundry, driving, shopping, community activity, occupation, and yard work  PERSONAL FACTORS: Fitness, Past/current experiences, Time since onset of injury/illness/exacerbation, and 3+ comorbidities: See Pmhx are also affecting patient's functional outcome.   REHAB POTENTIAL: Fair due to co-morbidities  CLINICAL DECISION MAKING: Unstable/unpredictable  EVALUATION COMPLEXITY: High   GOALS: Goals reviewed with patient? Yes  SHORT TERM GOALS: Target date: 11/17/23  Pt will tolerate full aquatic sessions consistently without increase in pain and with improving function to demonstrate good toleration and effectiveness of  intervention.  Baseline: Goal status: INITIAL  2.  Pt will amb x 10 minutes continuously submerged to demonstrate improving endurance Baseline:  Goal status: INITIAL  3.  Pt will perform STS from pool bench x 10 without LOB Baseline:  Goal status: INITIAL   LONG TERM GOALS: Target date: 12/19/23  Pt to improve on ODI by  up to 10% % to demonstrate statistically significant Improvement in function. Baseline: 38/50=76%  Goal status: INITIAL  2.  Pt will report decreased frequency of waking at night by 50% Baseline:  Goal status: INITIAL  3.  Pt will tolerate walking to and from setting and engaging in aquatic therapy session without excessive fatigue or increase in pain to demonstrate improved toleration to activity. Baseline:  Goal status: INITIAL  4.  Pt will tolerate stair climbing into and exiting pool x 7 steps ascending and descending 6 steps without use of handrail Baseline:  Goal status: INITIAL  5.  Pt will report decrease in pain by at least 50% for improved toleration to activity/quality of life and to demonstrate improved management of pain. Baseline:  Goal status: INITIAL  6.  Pt will be indep with final HEP's (land and aquatic as appropriate) for continued management of condition Baseline:  Goal status: INITIAL  PLAN:  PT FREQUENCY: 1-2x/week  PT DURATION: 10 weeks  PLANNED INTERVENTIONS: 97164- PT Re-evaluation, 97750- Physical Performance Testing, 97110-Therapeutic exercises, 97530- Therapeutic activity, [id number]- Neuromuscular re-education, 97535- Self Care, 16109- Manual therapy, [id number]- Gait  training, 78295- Orthotic Initial, [phone]- Aquatic Therapy, [phone]- Electrical stimulation (unattended), [phone]- Electrical stimulation (manual), [id number]- Ionotophoresis 4mg /ml Dexamethasone , 95284 (1-2 muscles), 20561 (3+ muscles)- Dry Needling, Patient/Family education, Balance training, Stair training, Taping, Joint mobilization, DME instructions, Cryotherapy, and Moist  heat.  PLAN FOR NEXT SESSION: aquatics: trial toleration; general strengthening and stretching; balance and gait training, pain management   Lucinda Saber) Ronell Boldin MPT 10/09/23 12:15 PM [facility] [address] [place], [place], [address] Phone: [phone]   Fax:  [phone]

## 2023-10-09 ENCOUNTER — Other Ambulatory Visit: Payer: Self-pay

## 2023-10-09 ENCOUNTER — Encounter (HOSPITAL_BASED_OUTPATIENT_CLINIC_OR_DEPARTMENT_OTHER): Payer: Self-pay | Admitting: Physical Therapy

## 2023-10-21 ENCOUNTER — Ambulatory Visit: Payer: Self-pay

## 2023-10-21 NOTE — Telephone Encounter (Signed)
        FYI Only or Action Required?: FYI only for provider.  Patient was last seen in primary care on 08/28/2023 by Curtis Debby PARAS, MD. Called Nurse Triage reporting Chills. Symptoms began yesterday. Interventions attempted: OTC medications: Tylenol. Symptoms are: unchanged. Feels like she has a fever. Thermometer broken. Has chills on and off. Drinking fluids well. :I can't drive anywhere today to be seen.  Triage Disposition: Home Care  Patient/caregiver understands and will follow disposition?: YesCopied from CRM [id number]. Topic: Clinical - Red Word Triage >> Oct 21, 2023  2:04 PM Brittney F wrote: Red Word that prompted transfer to Nurse Triage:   Not feeling well; Patient feels like she may have a fever; The patient stated she feels like she is going from hot to cold repeatedly; loss of appetite; fatigue  Symptoms have been occurring since last night Reason for Disposition  [1] Fever AND [2] no signs of serious infection or localizing symptoms (all other triage questions negative)  Answer Assessment - Initial Assessment Questions 1. TEMPERATURE: What is the most recent temperature?  How was it measured?      Thermometer 2. ONSET: When did the fever start?      today 3. CHILLS: Do you have chills? If yes: How bad are they?  (e.g., none, mild, moderate, severe)   - NONE: no chills   - MILD: feeling cold   - MODERATE: feeling very cold, some shivering (feels better under a thick blanket)   - SEVERE: feeling extremely cold with shaking chills (general body shaking, rigors; even under a thick blanket)      Mild 4. OTHER SYMPTOMS: Do you have any other symptoms besides the fever?  (e.g., abdomen pain, cough, diarrhea, earache, headache, sore throat, urination pain)     fatigue 5. CAUSE: If there are no symptoms, ask: What do you think is causing the fever?      unsure 6. CONTACTS: Does anyone else in the family have an infection?     no 7. TREATMENT: What  have you done so far to treat this fever? (e.g., medications)     Drinking fluids 8. IMMUNOCOMPROMISE: Do you have of the following: diabetes, HIV positive, splenectomy, cancer chemotherapy, chronic steroid treatment, transplant patient, etc.     no 9. PREGNANCY: Is there any chance you are pregnant? When was your last menstrual period?     no 10. TRAVEL: Have you traveled out of the country in the last month? (e.g., travel history, exposures)       no  Protocols used: [facility]

## 2023-10-23 DIAGNOSIS — H21563 Pupillary abnormality, bilateral: Secondary | ICD-10-CM | POA: Diagnosis not present

## 2023-10-23 DIAGNOSIS — H2513 Age-related nuclear cataract, bilateral: Secondary | ICD-10-CM | POA: Diagnosis not present

## 2023-10-25 ENCOUNTER — Other Ambulatory Visit: Payer: Self-pay | Admitting: Family Medicine

## 2023-10-25 DIAGNOSIS — E1159 Type 2 diabetes mellitus with other circulatory complications: Secondary | ICD-10-CM

## 2023-10-28 ENCOUNTER — Encounter (HOSPITAL_BASED_OUTPATIENT_CLINIC_OR_DEPARTMENT_OTHER): Payer: Self-pay | Admitting: Physical Therapy

## 2023-10-28 ENCOUNTER — Ambulatory Visit (HOSPITAL_BASED_OUTPATIENT_CLINIC_OR_DEPARTMENT_OTHER): Payer: Medicare (Managed Care) | Attending: Sports Medicine | Admitting: Physical Therapy

## 2023-10-28 DIAGNOSIS — M5459 Other low back pain: Secondary | ICD-10-CM | POA: Diagnosis not present

## 2023-10-28 DIAGNOSIS — M6281 Muscle weakness (generalized): Secondary | ICD-10-CM | POA: Insufficient documentation

## 2023-10-28 DIAGNOSIS — R2689 Other abnormalities of gait and mobility: Secondary | ICD-10-CM | POA: Insufficient documentation

## 2023-10-28 DIAGNOSIS — Z7689 Persons encountering health services in other specified circumstances: Secondary | ICD-10-CM | POA: Diagnosis not present

## 2023-10-28 NOTE — Therapy (Signed)
 OUTPATIENT PHYSICAL THERAPY THORACOLUMBAR TREATMENT   Patient Name: Gabriela Guerrero MRN: [id number] DOB:[birth year], 63 y.o., female Today's Date: 10/28/2023  END OF SESSION:  PT End of Session - 10/28/23 0903     Visit Number 2    Number of Visits 20    Date for PT Re-Evaluation 12/19/23    Authorization Type medicare    PT Start Time 0846    PT Stop Time 0924    PT Time Calculation (min) 38 min    Activity Tolerance Patient limited by pain;Patient tolerated treatment well    Behavior During Therapy [facility] for tasks assessed/performed          Past Medical History:  Diagnosis Date   Hypertension    Past Surgical History:  Procedure Laterality Date   LUMBAR FUSION     Patient Active Problem List   Diagnosis Date Noted   Migraine 09/24/2022   Decreased activities of daily living (ADL) 05/05/2022   Tongue lesion 03/25/2022   Right hip pain 01/02/2022   Chronic pain of right knee 01/02/2022   Breast pain, right 05/14/2021   Failed back syndrome of lumbar spine 08/15/2020   Persistent dyspnea after COVID-19 05/25/2020   Stage 3a chronic kidney disease (HCC) 07/30/2019   MDD (major depressive disorder) 07/12/2019   Moderate mitral regurgitation 06/24/2019   Insomnia 06/01/2018   S/P lumbar spinal fusion 09/01/2017   Uncontrolled type 2 diabetes mellitus with hyperglycemia, with long-term current use of insulin  (HCC) 01/29/2017   Hypertension associated with diabetes (HCC) 01/26/2017   Diabetic retinopathy (HCC) 04/13/2013   GERD (gastroesophageal reflux disease) 07/23/2010    PCP: Velma Ku DO  REFERRING PROVIDER: Curtis Debby PARAS, MD   REFERRING DIAG: M96.1 (ICD-10-CM) - Failed back syndrome of lumbar spine   Rationale for Evaluation and Treatment: Rehabilitation  THERAPY DIAG:  Other low back pain  Muscle weakness (generalized)  Other abnormalities of gait and mobility  ONSET DATE: Chronic  SUBJECTIVE:                                                                                                                                                                                            SUBJECTIVE STATEMENT: I came early wanted to make sure I was on time.   Initial Subjective Lumbar fusion 2019. No pain improvement.  Oa knee.Has had aquatic therapy in past. Had injections in past that did not work.  Pt reports being in bed most of time as that is where I feel the best.  Had Home care earlier this year and have been doing the exercises they gave me using bands, exercises standing.  Hurts  but feels good. Pain is constant and high.  PERTINENT HISTORY:  As per FI:Olfajm spondylosis  Failed back syndrome of lumbar spine:63 year old female with diabetes, morbid obesity, chronic low back pain, she is status post an L4-L5 fusion with persistent pain, she endorses she got no relief not even temporary. She does have a CT scan from 2 years ago that does show what appears to be a stable fusion, she has adjacent severe facet arthropathy, pain is better with flexion. . . . multilevel facet joint injections, likely right sided L3-L4 and right sided L5-S1.     PAIN:  Are you having pain? Yes: NPRS scale: current 10/10 Pain location: R LB with radiation into toes Pain description: sharp pain to the touch, ache Aggravating factors: car riding, walking, standing, transfers Relieving factors: meds; sitting or lying down with legs elevated  PRECAUTIONS: Fall  RED FLAGS: None   WEIGHT BEARING RESTRICTIONS: No  FALLS:  Has patient fallen in last 6 months? 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  DIAGNOSTIC FINDINGS:  None recent  PATIENT SURVEYS:  Modified Oswestry 38/50=76%   COGNITION: Overall cognitive status: Within functional limits for tasks assessed     SENSATION: Radicular pain into rle>Left to feet   POSTURE: decreased lumbar lordosis and flexed trunk   PALPATION: Moderate TTP Lumbar spine to hips Hyperesthesia throughout rle  LUMBAR ROM:   Limited by at least 75%   LOWER EXTREMITY MMT:    MMT Right eval Left eval  Hip flexion 2+P! 3P!  Hip extension    Hip abduction    Hip adduction    Hip internal rotation    Hip external rotation    Knee flexion 3 3  Knee extension 3 3  Ankle dorsiflexion    Ankle plantarflexion    Ankle inversion    Ankle eversion     (Blank rows = not tested)   FUNCTIONAL TESTS:  2 minute WT:23ft using rollator  GAIT: Distance walked: 300 ft to setting 2 standing rest periods Assistive device utilized: Walker - 4 wheeled Level of assistance: SBA Comments: antalgic, dragging heels, guarded posture, leaning on forearms  TREATMENT  [facility] Adult PT Treatment:                                                DATE: 10/28/23 Pt seen for aquatic therapy today.  Treatment took place in water 3.5-4.75 ft in depth at the [facility] pool. Temp of water was 91.  Pt entered/exited the pool via steps backing down with VC and cga exits via lift.  *Intro to setting *stair negotiation bakcing down steps with cga, 2 standing rest periods before reaching bottom of pool *standing ue support hand rails: df; marching; hip add/abd x 5 *side stepping ue support on hand rails the wall *seated on lift: hip add/abd; LAQ; hip flex *STS from lift with vc and supervision (done well) *side stepping ue on wall r/l x 10 steps *exit via  lift  Pt requires the buoyancy and hydrostatic pressure of water for support, and to offload joints by unweighting joint load by at least 50 %  in navel deep water and by at least 75-80% in chest to neck deep water.  Viscosity of the water is needed for resistance of strengthening. Water current perturbations provides challenge to standing balance requiring increased core activation.                                                                                                                                    PATIENT EDUCATION:  Education details: Discussed eval findings, rehab rationale, aquatic program progression/POC and pools in area. Patient is in agreement  Person educated: Patient Education method: Explanation Education comprehension: verbalized understanding  HOME EXERCISE PROGRAM: TBA  ASSESSMENT:  CLINICAL IMPRESSION: Pt demonstrates safety in aquatic setting with therapist instructing from deck. She is new to aquatic setting and is apprehensive throughout.  To pt request she negotiates steps backing down using handrail with cga.  Pt is directed through general LE movement patterns and trials in both sitting and standing positions (ue support on hand rails then wall). Pt with positive attitude using self talk to work through discomfort.  Apprehensive decreases ~ 50% as session progresses.  Her pain level remains constant throughout .  We will continue trial of  aquatic therapy.  Anticipate positive response.  Of  note : it does require a great deal of effort on pt's part to participate (transportation; distance to and from setting and changing without a CG.  Consider using lift for safety and improved toleration to initial appts.    Initial Impression Patient is a 63 y.o. f who was seen today for physical therapy evaluation and treatment for failed Back Syndrome of Lumbar spine. She presents by herself using transportation services to get here.  She does walk back to setting but with difficulty needing to rest at least x 2 pushing rollator.  She reports significant pain for past several years following an unsuccessful  lumbar surgery.  She is highly pain sensitive with all movement as well as exhibiting symptoms of hyperesthesia throughout rle.  She was limited due to pain with assessment today.  She is a good candidate for skilled PT with concentration initially in aquatics.  Will trial 1 x a week to build tolerance to level of activity required to participate (transportation, distance to and from setting, changing clothes and exercise) then increase to 2 x week.  She is extremely limited in all functional mobility and ADL's due to deficits. She is a good candidate for aquatic intervention and will benefit from the properties of water to progress towards functional goals. She is agreement with POC. Will need lift for entrance/exit to pool  OBJECTIVE IMPAIRMENTS: Abnormal gait, decreased activity tolerance, decreased balance, decreased endurance, decreased mobility, difficulty walking, decreased ROM, decreased strength, increased muscle spasms, impaired sensation, postural dysfunction, obesity, and  pain.   ACTIVITY LIMITATIONS: carrying, lifting, bending, sitting, standing, squatting, sleeping, stairs, transfers, bed mobility, bathing, dressing, hygiene/grooming, locomotion level, and caring for others  PARTICIPATION LIMITATIONS: meal prep, cleaning, laundry, driving, shopping, community activity, occupation, and yard work  PERSONAL FACTORS: Fitness, Past/current experiences, Time since onset of injury/illness/exacerbation, and 3+ comorbidities: See Pmhx are also affecting patient's functional outcome.   REHAB POTENTIAL: Fair due to co-morbidities  CLINICAL DECISION MAKING: Unstable/unpredictable  EVALUATION COMPLEXITY: High   GOALS: Goals reviewed with patient? Yes  SHORT TERM GOALS: Target date: 11/17/23  Pt will tolerate full aquatic sessions consistently without increase in pain and with improving function to demonstrate good toleration and effectiveness of intervention.  Baseline: Goal status:  INITIAL  2.  Pt will amb x 10 minutes continuously submerged to demonstrate improving endurance Baseline:  Goal status: INITIAL  3.  Pt will perform STS from pool bench x 10 without LOB Baseline:  Goal status: INITIAL   LONG TERM GOALS: Target date: 12/19/23  Pt to improve on ODI by  up to 10% % to demonstrate statistically significant Improvement in function. Baseline: 38/50=76%  Goal status: INITIAL  2.  Pt will report decreased frequency of waking at night by 50% Baseline:  Goal status: INITIAL  3.  Pt will tolerate walking to and from setting and engaging in aquatic therapy session without excessive fatigue or increase in pain to demonstrate improved toleration to activity. Baseline:  Goal status: INITIAL  4.  Pt will tolerate stair climbing into and exiting pool x 7 steps ascending and descending 6 steps without use of handrail Baseline:  Goal status: INITIAL  5.  Pt will report decrease in pain by at least 50% for improved toleration to activity/quality of life and to demonstrate improved management of pain. Baseline:  Goal status: INITIAL  6.  Pt will be indep with final HEP's (land and aquatic as appropriate) for continued management of condition Baseline:  Goal status: INITIAL  PLAN:  PT FREQUENCY: 1-2x/week  PT DURATION: 10 weeks  PLANNED INTERVENTIONS: 97164- PT Re-evaluation, 97750- Physical Performance Testing, 97110-Therapeutic exercises, 97530- Therapeutic activity, 97112- Neuromuscular re-education, 97535- Self Care, 02859- Manual therapy, [id number]- Gait training, [phone]- Orthotic Initial, [phone]- Aquatic Therapy, [id number]- Electrical stimulation (unattended), [phone]- Electrical stimulation (manual), [id number]- Ionotophoresis 4mg /ml Dexamethasone , 79439 (1-2 muscles), 20561 (3+ muscles)- Dry Needling, Patient/Family education, Balance training, Stair training, Taping, Joint mobilization, DME instructions, Cryotherapy, and Moist heat.  PLAN FOR NEXT SESSION: aquatics:  trial toleration; general strengthening and stretching; balance and gait training, pain management   Ronal Foots) Benjimen Kelley MPT 10/28/23 9:07 AM [facility] [address] [place], [place], [address] Phone: [phone]   Fax:  [phone]

## 2023-11-03 ENCOUNTER — Ambulatory Visit: Payer: Self-pay

## 2023-11-03 DIAGNOSIS — I10 Essential (primary) hypertension: Secondary | ICD-10-CM | POA: Diagnosis not present

## 2023-11-03 DIAGNOSIS — E1165 Type 2 diabetes mellitus with hyperglycemia: Secondary | ICD-10-CM | POA: Diagnosis not present

## 2023-11-03 DIAGNOSIS — R197 Diarrhea, unspecified: Secondary | ICD-10-CM | POA: Diagnosis not present

## 2023-11-03 DIAGNOSIS — R109 Unspecified abdominal pain: Secondary | ICD-10-CM | POA: Diagnosis not present

## 2023-11-03 DIAGNOSIS — R110 Nausea: Secondary | ICD-10-CM | POA: Diagnosis not present

## 2023-11-03 NOTE — Telephone Encounter (Signed)
 FYI - patient triaged to ED  FYI Only or Action Required?: FYI only for provider.  Patient was last seen in primary care on 08/28/2023 by Curtis Debby PARAS, MD.  Called Nurse Triage reporting Dehydration and Diarrhea.  Symptoms began several days ago.  Interventions attempted: Nothing.  Symptoms are: gradually worsening.  Triage Disposition: Go to ED Now (or PCP Triage)  Patient/caregiver understands and will follow disposition?: Yes  Copied from CRM [id number]. Topic: Clinical - Red Word Triage >> Nov 03, 2023  4:49 PM Corin V wrote: [facility] that prompted transfer to Nurse Triage: Patient is having severe diarrhea and is dehydrated. She has been trying to consume ginger tea and chicken broth and cannot keep food down for about 2 weeks now. She was taking mounjaro  but has not taken it the last 2 weeks because she is scared to take it. She also cannot find her blood pressure meds. She has had a fever as well. She is getting cold then hot and clammy. She cannot come into the office due to no transport but is debating going to the emergency room. She is currently having significant stomach pain. Reason for Disposition  Patient sounds very sick or weak to the triager  Answer Assessment - Initial Assessment Questions Patient has not taken Mounjaro  for 2 weeks d/t constipation and now diarrhea.  1. DIARRHEA SEVERITY: How bad is the diarrhea? How many more stools have you had in the past 24 hours than normal?      Severe since 10/30/23 2. ONSET: When did the diarrhea begin?      10/30/23, was constipated previously 3. STOOL DESCRIPTION:  How loose or watery is the diarrhea? What is the stool color? Is there any blood or mucous in the stool?     Brown, loose. Denies blood or dark/tarry/coffee grind appearance 4. VOMITING: Are you also vomiting? If Yes, ask: How many times in the past 24 hours?      Constant nausea, vomited twice 5. ABDOMEN PAIN: Are you having any abdomen  pain? If Yes, ask: What does it feel like? (e.g., crampy, dull, intermittent, constant)      Yes, generalized/all over 9. EXPOSURE: Have you traveled to a foreign country recently? Have you been exposed to anyone with diarrhea? Could you have eaten any food that was spoiled?     No 10. ANTIBIOTIC USE: Are you taking antibiotics now or have you taken antibiotics in the past 2 months?       No 11. OTHER SYMPTOMS: Do you have any other symptoms? (e.g., fever, blood in stool)       Nausea, vomited x2, felt feverish intermittently  Protocols used: Diarrhea-A-AH

## 2023-11-04 NOTE — Telephone Encounter (Signed)
 Several calls were made to the patient for scheduling an appointment. No answer each time. Left a third message for the patient to return a call back to the clinic for scheduling. Direct call back info provided.

## 2023-11-04 NOTE — ED Notes (Signed)
 Upon discharge patients blood pressure was 187/87. Provider notified and instructed patient to take at home meds when she got home for her elevated blood pressure.

## 2023-11-05 ENCOUNTER — Ambulatory Visit (HOSPITAL_BASED_OUTPATIENT_CLINIC_OR_DEPARTMENT_OTHER): Payer: Medicare (Managed Care) | Admitting: Physical Therapy

## 2023-11-06 ENCOUNTER — Other Ambulatory Visit: Payer: Self-pay

## 2023-11-07 ENCOUNTER — Ambulatory Visit (HOSPITAL_BASED_OUTPATIENT_CLINIC_OR_DEPARTMENT_OTHER): Payer: Medicare (Managed Care) | Admitting: Physical Therapy

## 2023-11-11 ENCOUNTER — Ambulatory Visit (HOSPITAL_BASED_OUTPATIENT_CLINIC_OR_DEPARTMENT_OTHER): Payer: Medicare (Managed Care) | Admitting: Physical Therapy

## 2023-11-11 ENCOUNTER — Encounter (HOSPITAL_BASED_OUTPATIENT_CLINIC_OR_DEPARTMENT_OTHER): Payer: Self-pay | Admitting: Physical Therapy

## 2023-11-11 DIAGNOSIS — M5459 Other low back pain: Secondary | ICD-10-CM

## 2023-11-11 DIAGNOSIS — R2689 Other abnormalities of gait and mobility: Secondary | ICD-10-CM

## 2023-11-11 DIAGNOSIS — M6281 Muscle weakness (generalized): Secondary | ICD-10-CM

## 2023-11-11 NOTE — Therapy (Signed)
 OUTPATIENT PHYSICAL THERAPY THORACOLUMBAR TREATMENT   Patient Name: Gabriela Guerrero MRN: [id number] DOB:October 19, 1960, 63 y.o., female Today's Date: 11/11/2023  END OF SESSION:  PT End of Session - 11/11/23 1241     Visit Number 3    Number of Visits 20    Date for PT Re-Evaluation 12/19/23    Authorization Type medicare    PT Start Time 0935    PT Stop Time 1014    PT Time Calculation (min) 39 min    Activity Tolerance Patient limited by pain;Patient tolerated treatment well    Behavior During Therapy [facility] for tasks assessed/performed           Past Medical History:  Diagnosis Date   Hypertension    Past Surgical History:  Procedure Laterality Date   LUMBAR FUSION     Patient Active Problem List   Diagnosis Date Noted   Migraine 09/24/2022   Decreased activities of daily living (ADL) 05/05/2022   Tongue lesion 03/25/2022   Right hip pain 01/02/2022   Chronic pain of right knee 01/02/2022   Breast pain, right 05/14/2021   Failed back syndrome of lumbar spine 08/15/2020   Persistent dyspnea after COVID-19 05/25/2020   Stage 3a chronic kidney disease (HCC) 07/30/2019   MDD (major depressive disorder) 07/12/2019   Moderate mitral regurgitation 06/24/2019   Insomnia 06/01/2018   S/P lumbar spinal fusion 09/01/2017   Uncontrolled type 2 diabetes mellitus with hyperglycemia, with long-term current use of insulin  (HCC) 01/29/2017   Hypertension associated with diabetes (HCC) 01/26/2017   Diabetic retinopathy (HCC) 04/13/2013   GERD (gastroesophageal reflux disease) 07/23/2010    PCP: Velma Ku DO  REFERRING PROVIDER: Curtis Debby PARAS, MD   REFERRING DIAG: M96.1 (ICD-10-CM) - Failed back syndrome of lumbar spine   Rationale for Evaluation and Treatment: Rehabilitation  THERAPY DIAG:  Other low back pain  Muscle weakness (generalized)  Other abnormalities of gait and mobility  ONSET DATE: Chronic  SUBJECTIVE:                                                                                                                                                                                            SUBJECTIVE STATEMENT: Pt reports ER trip due to constipation and dehydration. She missed 2 visits but is feeling better today. Back pain 10/10. She does report muscle soreness after 1st aquatic session.   Initial Subjective Lumbar fusion 2019. No pain improvement.  Oa knee.Has had aquatic therapy in past. Had injections in past that did not work.  Pt reports being in bed most of time as that is where I feel the best.  Had  Home care earlier this year and have been doing the exercises they gave me using bands, exercises standing.  Hurts but feels good. Pain is constant and high.  PERTINENT HISTORY:  As per FI:Olfajm spondylosis  Failed back syndrome of lumbar spine:63 year old female with diabetes, morbid obesity, chronic low back pain, she is status post an L4-L5 fusion with persistent pain, she endorses she got no relief not even temporary. She does have a CT scan from 2 years ago that does show what appears to be a stable fusion, she has adjacent severe facet arthropathy, pain is better with flexion. . . . multilevel facet joint injections, likely right sided L3-L4 and right sided L5-S1.     PAIN:  Are you having pain? Yes: NPRS scale: current 10/10 Pain location: R LB with radiation into toes Pain description: sharp pain to the touch, ache Aggravating factors: car riding, walking, standing, transfers Relieving factors: meds; sitting or lying down with legs elevated  PRECAUTIONS: Fall  RED FLAGS: None   WEIGHT BEARING RESTRICTIONS: No  FALLS:  Has patient fallen in last 6 months? 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  DIAGNOSTIC FINDINGS:  None recent  PATIENT SURVEYS:  Modified Oswestry 38/50=76%   COGNITION: Overall cognitive status: Within functional limits for tasks assessed     SENSATION: Radicular pain into rle>Left to feet   POSTURE: decreased lumbar lordosis and flexed trunk   PALPATION: Moderate TTP Lumbar spine to hips Hyperesthesia throughout rle  LUMBAR ROM:   Limited by at least 75%   LOWER EXTREMITY MMT:    MMT Right eval Left eval  Hip flexion 2+P! 3P!  Hip extension    Hip abduction    Hip adduction    Hip internal rotation    Hip external rotation    Knee flexion 3 3  Knee extension 3 3  Ankle dorsiflexion    Ankle plantarflexion    Ankle inversion    Ankle eversion     (Blank rows = not tested)   FUNCTIONAL TESTS:  2 minute WT:37ft using rollator  GAIT: Distance walked: 300 ft to setting 2 standing rest periods Assistive device utilized: Walker - 4 wheeled Level of assistance: SBA Comments: antalgic, dragging heels, guarded posture, leaning on forearms  TREATMENT  [facility] Adult PT Treatment:                                                DATE: 11/11/23 Pt seen for aquatic therapy today.  Treatment took place in water 3.5-4.75 ft in depth at the [facility] pool. Temp of water was 91.  Pt entered/exited the pool via lift.   *seated on lift: hip add/abd; LAQ; cycling *STS from lift with vc and supervision  *side stepping ue support wall *standing ue support wall: df; marching *seated rest period-> hip add/abd *forward amb ue support on barbell 10 ft x 4.  Pt requires the buoyancy and hydrostatic pressure of water for support, and to  offload joints by unweighting joint load by at least 50 % in navel deep water and by at least 75-80% in  chest to neck deep water.  Viscosity of the water is needed for resistance of strengthening. Water current perturbations provides challenge to standing balance requiring increased core activation.                                                                                                                                    PATIENT EDUCATION:  Education details: Discussed eval findings, rehab rationale, aquatic program progression/POC and pools in area. Patient is in agreement  Person educated: Patient Education method: Explanation Education comprehension: verbalized understanding  HOME EXERCISE PROGRAM: TBA  ASSESSMENT:  CLINICAL IMPRESSION: Pt to ER for dehydration ~ 1 week ago.  Missed 2 aquatic appts but returns today ready to move.  Did elect to use lift in and out for safety and improved toleration to session today. Pt was in agreement. Initially pain level high 10/10.  She arrives to session after walking 1/2 way then pushing herself backward the other half.  She requires multiple rest periods throughout session. Minimal toleration to exercise with reports of left thigh pain. Cues for clearing feet from floor with amb with difficulty completing. She is having cataract surgery end of month.  Will need to address schedule.  Goals ongoing.    Consider using lift for safety and improved toleration to initial appts.    Initial Impression Patient is a 63 y.o. f who was seen today for physical therapy evaluation and treatment for failed Back Syndrome of Lumbar spine. She presents by herself using transportation services to get here.  She does walk back to setting but with difficulty needing to rest at least x 2 pushing rollator.  She reports significant pain for past several years following an unsuccessful lumbar surgery.  She is highly pain sensitive with all movement as well as exhibiting symptoms of hyperesthesia throughout rle.  She was limited due to pain with assessment today.   She is a good candidate for skilled PT with concentration initially in aquatics.  Will trial 1 x a week to build tolerance to level of activity required to participate (transportation, distance to and from setting, changing clothes and exercise) then increase to 2 x week.  She is extremely limited in all functional mobility and ADL's due to deficits. She is a good candidate for aquatic intervention and will benefit from the properties of water to progress towards functional goals. She is agreement with POC. Will need lift for entrance/exit to pool  OBJECTIVE IMPAIRMENTS: Abnormal gait, decreased activity tolerance, decreased balance, decreased endurance, decreased mobility, difficulty walking, decreased ROM, decreased strength, increased muscle spasms, impaired sensation, postural dysfunction, obesity, and pain.   ACTIVITY LIMITATIONS: carrying, lifting, bending, sitting, standing, squatting, sleeping, stairs, transfers, bed mobility, bathing, dressing, hygiene/grooming, locomotion level, and caring for others  PARTICIPATION LIMITATIONS: meal prep, cleaning, laundry,  driving, shopping, community activity, occupation, and yard work  PERSONAL FACTORS: Fitness, Past/current experiences, Time since onset of injury/illness/exacerbation, and 3+ comorbidities: See Pmhx are also affecting patient's functional outcome.   REHAB POTENTIAL: Fair due to co-morbidities  CLINICAL DECISION MAKING: Unstable/unpredictable  EVALUATION COMPLEXITY: High   GOALS: Goals reviewed with patient? Yes  SHORT TERM GOALS: Target date: 11/17/23  Pt will tolerate full aquatic sessions consistently without increase in pain and with improving function to demonstrate good toleration and effectiveness of intervention.  Baseline: Goal status: INITIAL  2.  Pt will amb x 10 minutes continuously submerged to demonstrate improving endurance Baseline:  Goal status: INITIAL  3.  Pt will perform STS from pool bench x 10 without  LOB Baseline:  Goal status: INITIAL   LONG TERM GOALS: Target date: 12/19/23  Pt to improve on ODI by  up to 10% % to demonstrate statistically significant Improvement in function. Baseline: 38/50=76%  Goal status: INITIAL  2.  Pt will report decreased frequency of waking at night by 50% Baseline:  Goal status: INITIAL  3.  Pt will tolerate walking to and from setting and engaging in aquatic therapy session without excessive fatigue or increase in pain to demonstrate improved toleration to activity. Baseline:  Goal status: INITIAL  4.  Pt will tolerate stair climbing into and exiting pool x 7 steps ascending and descending 6 steps without use of handrail Baseline:  Goal status: INITIAL  5.  Pt will report decrease in pain by at least 50% for improved toleration to activity/quality of life and to demonstrate improved management of pain. Baseline:  Goal status: INITIAL  6.  Pt will be indep with final HEP's (land and aquatic as appropriate) for continued management of condition Baseline:  Goal status: INITIAL  PLAN:  PT FREQUENCY: 1-2x/week  PT DURATION: 10 weeks  PLANNED INTERVENTIONS: 97164- PT Re-evaluation, 97750- Physical Performance Testing, 97110-Therapeutic exercises, 97530- Therapeutic activity, 97112- Neuromuscular re-education, 97535- Self Care, 02859- Manual therapy, [id number]- Gait training, [phone]- Orthotic Initial, [phone]- Aquatic Therapy, [phone]- Electrical stimulation (unattended), [phone]- Electrical stimulation (manual), [id number]- Ionotophoresis 4mg /ml Dexamethasone , 79439 (1-2 muscles), 20561 (3+ muscles)- Dry Needling, Patient/Family education, Balance training, Stair training, Taping, Joint mobilization, DME instructions, Cryotherapy, and Moist heat.  PLAN FOR NEXT SESSION: aquatics: trial toleration; general strengthening and stretching; balance and gait training, pain management   Ronal Foots) Adin Lariccia MPT 11/11/23 12:42 PM [facility] [address] [place], [place], [address] Phone: [phone]   Fax:  [phone]

## 2023-11-12 ENCOUNTER — Ambulatory Visit: Payer: Self-pay

## 2023-11-12 DIAGNOSIS — H2513 Age-related nuclear cataract, bilateral: Secondary | ICD-10-CM | POA: Diagnosis not present

## 2023-11-12 DIAGNOSIS — Z7689 Persons encountering health services in other specified circumstances: Secondary | ICD-10-CM | POA: Diagnosis not present

## 2023-11-12 DIAGNOSIS — H2511 Age-related nuclear cataract, right eye: Secondary | ICD-10-CM | POA: Diagnosis not present

## 2023-11-12 NOTE — Telephone Encounter (Signed)
 Patient scheduled for 11/13/2023 for hospital follow up with Dr. Alvan

## 2023-11-12 NOTE — Telephone Encounter (Signed)
 FYI Only or Action Required?: FYI only for provider.  Patient was last seen in primary care on 08/28/2023 by Gabriela Debby PARAS, MD.  Called Nurse Triage reporting Constipation.  Symptoms began about a month ago.  Interventions attempted: OTC medications: miralax.  Symptoms are: unchanged.  Triage Disposition: See Physician Within 24 Hours  Patient/caregiver understands and will follow disposition?: Yes Copied from CRM [id number]. Topic: Clinical - Red Word Triage >> Nov 12, 2023 10:09 AM Gabriela Guerrero wrote: Red Word that prompted transfer to Nurse Triage: Patient is still constipated, experiencing diarrhea, and dry mouth. Received IV from ER for dehydration 7/14. Still not relieved. They did a CAT scan and found nothing. Needs some other medicine. She does not want to come in for an appointment. Patient has taken Miralax and Pepto bismal and stool softners at different times. Reason for Disposition  Last bowel movement (BM) > 4 days ago  Answer Assessment - Initial Assessment Questions 1. STOOL PATTERN OR FREQUENCY: How often do you have a bowel movement (BM)?  (Normal range: 3 times a day to every 3 days)  When was your last BM?       Patient does not recall her last bowel movement. She states that she has only had one BM since returning from ED one week ago  3. ONSET: When did the constipation begin?     Patient has been going back and forth between diarrhea and constipation for at least one month 8. CHANGES IN DIET OR HYDRATION: Have there been any recent changes in your diet? How much fluids are you drinking on a daily basis?  How much have you had to drink today?     Reports that she drinks 6-7 bottles of water per day and that she has been consuming a a mostly liquid diet per d/c instructions from the ED last week  10. LAXATIVES: Have you been using any stool softeners, laxatives, or enemas?  If Yes, ask What are you using, how often, and when was the last time?        miralax  12. CAUSE: What do you think is causing the constipation?        unsure  14. OTHER SYMPTOMS: Do you have any other symptoms? (e.g., abdomen pain, bloating, fever, vomiting)       Dry mouth  Protocols used: Constipation-A-AH

## 2023-11-13 ENCOUNTER — Ambulatory Visit (INDEPENDENT_AMBULATORY_CARE_PROVIDER_SITE_OTHER): Payer: Medicare (Managed Care) | Admitting: Family Medicine

## 2023-11-13 ENCOUNTER — Encounter (HOSPITAL_BASED_OUTPATIENT_CLINIC_OR_DEPARTMENT_OTHER): Payer: Self-pay | Admitting: Physical Therapy

## 2023-11-13 ENCOUNTER — Ambulatory Visit (HOSPITAL_BASED_OUTPATIENT_CLINIC_OR_DEPARTMENT_OTHER): Payer: Medicare (Managed Care) | Admitting: Physical Therapy

## 2023-11-13 VITALS — BP 142/60 | HR 62 | Ht 61.0 in | Wt 244.0 lb

## 2023-11-13 DIAGNOSIS — E1165 Type 2 diabetes mellitus with hyperglycemia: Secondary | ICD-10-CM

## 2023-11-13 DIAGNOSIS — M961 Postlaminectomy syndrome, not elsewhere classified: Secondary | ICD-10-CM | POA: Diagnosis not present

## 2023-11-13 DIAGNOSIS — M5459 Other low back pain: Secondary | ICD-10-CM

## 2023-11-13 DIAGNOSIS — K5903 Drug induced constipation: Secondary | ICD-10-CM | POA: Diagnosis not present

## 2023-11-13 DIAGNOSIS — M6281 Muscle weakness (generalized): Secondary | ICD-10-CM

## 2023-11-13 DIAGNOSIS — I152 Hypertension secondary to endocrine disorders: Secondary | ICD-10-CM

## 2023-11-13 DIAGNOSIS — E1159 Type 2 diabetes mellitus with other circulatory complications: Secondary | ICD-10-CM

## 2023-11-13 DIAGNOSIS — R2689 Other abnormalities of gait and mobility: Secondary | ICD-10-CM

## 2023-11-13 DIAGNOSIS — N1831 Chronic kidney disease, stage 3a: Secondary | ICD-10-CM

## 2023-11-13 DIAGNOSIS — Z794 Long term (current) use of insulin: Secondary | ICD-10-CM

## 2023-11-13 DIAGNOSIS — E871 Hypo-osmolality and hyponatremia: Secondary | ICD-10-CM | POA: Diagnosis not present

## 2023-11-13 NOTE — Therapy (Signed)
 Pt arrives very late due to her transportation running late. No treatment.

## 2023-11-13 NOTE — Assessment & Plan Note (Addendum)
 She still struggling with a lot of pain in her back.  She is currently on Cymbalta  but has held her Lyrica  and her tramadol  because of the constipation.

## 2023-11-13 NOTE — Assessment & Plan Note (Signed)
 Serum creatinine in the ED was pretty stable to her baseline.  There will get some repeat labs updated today.  Lab Results  Component Value Date   CREATININE 1.33 (H) 12/11/2022

## 2023-11-13 NOTE — Patient Instructions (Addendum)
 Gabriela Guerrero   64 units every night  Continue Novolog  12 units with meals  Continue Ozempic  1 mg every other Sunday ( if you don't have in your fridge let us  know).    Continue to check a blood sugar up to 4 times daily  Morning fasting  Before meals

## 2023-11-13 NOTE — Progress Notes (Signed)
 Established Patient Office Visit  Subjective  Patient ID: Gabriela Guerrero, female    DOB: April 23, 1960  Age: 63 y.o. MRN: [id number]  Chief Complaint  Patient presents with   Hospitalization Follow-up    Constipation     HPI  She is here today for recent emergency department visit at [facility] on July 14.  She had presented initially with worsening constipation she had been trying to take MiraLAX pretty regularly but felt like the constipation got worse over couple of weeks and then drink a bottle of mag citrate and then started having bowel movements to the point of having diarrhea, decreased appetite nausea increased thirst, and increased urination she had stopped and held her medications because of the vomiting and when she reached got to the emergency department her blood pressure was 191/95 and her blood glucose was greater than 500 his machine would not read higher than that.  CBC was normal sodium was low serum creatinine was 1.38, glucose was 509.  By the time she was discharged home point-of-care glucose was down to 226.  She did have a CT of the abdomen performed with no acute process seen in the abdomen or pelvis.  She did have some degenerative changes in her spine as well as posterior fusion from L4-L5 with discectomy.  She was given IV fluids and then discharged home.  She was on ozempic  and then changed to Mounjaro  in May.  She says she has had problems on and off with her bowels.  But she also feels like probably the tramadol  and the Lyrica  are contributing as well she will go from being constipated to then having diarrhea she has been trying to take MiraLAX regularly and then will finally start having diarrhea then she ends up taking Pepto-Bismol.  More recently she actually ended up in the emergency department on July 14 at [facility].  She was experiencing diarrhea and vomiting at that time.  Her blood sugar levels were extremely high they had encouraged her to drink a clear  liquid diet until the diarrhea resolved and to monitor her sugars and her blood pressures closely at home.  She did stop the Mounjaro  about 3 weeks ago after having been on it for a little over a month.    ROS    Objective:     BP (!) 142/60   Pulse 62   Ht 5' 1 (1.549 m)   Wt 244 lb 0.6 oz (110.7 kg)   SpO2 100%   BMI 46.11 kg/m    Physical Exam Vitals and nursing note reviewed.  Constitutional:      Appearance: Normal appearance.  HENT:     Head: Normocephalic and atraumatic.  Eyes:     Conjunctiva/sclera: Conjunctivae normal.  Cardiovascular:     Rate and Rhythm: Normal rate and regular rhythm.  Pulmonary:     Effort: Pulmonary effort is normal.     Breath sounds: Normal breath sounds.  Skin:    General: Skin is warm and dry.  Neurological:     Mental Status: She is alert.  Psychiatric:        Mood and Affect: Mood normal.      No results found for any visits on 11/13/23.    The ASCVD Risk score (Arnett DK, et al., 2019) failed to calculate for the following reasons:   Cannot find a previous HDL lab    Assessment & Plan:   Problem List Items Addressed This Visit  Cardiovascular and Mediastinum   Hypertension associated with diabetes (HCC)   Blood pressure today is elevated she is currently on Exforge  and carvedilol  as well as clonidine .  Will recheck blood pressure before she leaves today.  She seems quite anxious.        Endocrine   Uncontrolled type 2 diabetes mellitus with hyperglycemia, with long-term current use of insulin  (HCC)   Of her history it is really unclear to me if she started feeling worse after switching to the Mounjaro  a little over a month ago though she was only on the 2.5 mg dose so it is hard to say she thinks she might of felt better on the Ozempic  so we could certainly continue the short and long-acting insulin .  Though I do not see an active prescription for the short acting on file but she says she is taking it.  And  possibly restart the Ozempic  every other week.  Will go ahead and check an A1c today so we know where we are starting from.  Her weight is down about 9 pounds since she was here in April.  [name]   64 units every night  Continue Novolog  12 units with meals  Continue Ozempic  1 mg every other Sunday ( if you don't have in your fridge let us  know).    Continue to check a blood sugar up to 4 times daily  Morning fasting  Before meals       Relevant Orders   CMP14+EGFR   Hemoglobin A1c   Urine Microalbumin w/creat. ratio     Genitourinary   Stage 3a chronic kidney disease (HCC)   Serum creatinine in the ED was pretty stable to her baseline.  There will get some repeat labs updated today.  Lab Results  Component Value Date   CREATININE 1.33 (H) 12/11/2022           Other   Failed back syndrome of lumbar spine   She still struggling with a lot of pain in her back.  She is currently on Cymbalta  but has held her Lyrica  and her tramadol  because of the constipation.      Other Visit Diagnoses       Hyponatremia    -  Primary   Relevant Orders   CMP14+EGFR   Hemoglobin A1c     Drug-induced constipation       Relevant Orders   Ambulatory referral to Gastroenterology       Sounds like she may have some component of drug-induced constipation but is having significant difficulty getting it regulated with products like MiraLAX and magnesium so we did discuss maybe a GI referral to see if they could assist us  with that in the meantime she has held her Lyrica  and her trazodone as well as the Mounjaro .  If she did do better with the Ozempic  then we can start that back.  Recheck sodium levels since they were low in the emergency room likely from the vomiting and diarrhea.  Return in about 4 weeks (around 12/11/2023) for Dr, Donnice, f/u Diabetes .    I spent 40 minutes on the day of the encounter to include pre-visit record review, face-to-face time with the patient and post  visit ordering of test.   Dorothyann Byars, MD

## 2023-11-13 NOTE — Assessment & Plan Note (Addendum)
 Of her history it is really unclear to me if she started feeling worse after switching to the Mounjaro  a little over a month ago though she was only on the 2.5 mg dose so it is hard to say she thinks she might of felt better on the Ozempic  so we could certainly continue the short and long-acting insulin .  Though I do not see an active prescription for the short acting on file but she says she is taking it.  And possibly restart the Ozempic  every other week.  Will go ahead and check an A1c today so we know where we are starting from.  Her weight is down about 9 pounds since she was here in April.  [name]   64 units every night  Continue Novolog  12 units with meals  Continue Ozempic  1 mg every other Sunday ( if you don't have in your fridge let us  know).    Continue to check a blood sugar up to 4 times daily  Morning fasting  Before meals

## 2023-11-13 NOTE — Assessment & Plan Note (Signed)
 Blood pressure today is elevated she is currently on Exforge  and carvedilol  as well as clonidine .  Will recheck blood pressure before she leaves today.  She seems quite anxious.

## 2023-11-14 ENCOUNTER — Ambulatory Visit: Payer: Self-pay | Admitting: Family Medicine

## 2023-11-14 ENCOUNTER — Ambulatory Visit: Payer: Self-pay

## 2023-11-14 LAB — HEMOGLOBIN A1C
Est. average glucose Bld gHb Est-mCnc: 275 mg/dL
Hgb A1c MFr Bld: 11.2 % — ABNORMAL HIGH (ref 4.8–5.6)

## 2023-11-14 LAB — CMP14+EGFR
ALT: 13 IU/L (ref 0–32)
AST: 11 IU/L (ref 0–40)
Albumin: 3.7 g/dL — ABNORMAL LOW (ref 3.9–4.9)
Alkaline Phosphatase: 80 IU/L (ref 44–121)
BUN/Creatinine Ratio: 12 (ref 12–28)
BUN: 20 mg/dL (ref 8–27)
Bilirubin Total: 0.4 mg/dL (ref 0.0–1.2)
CO2: 23 mmol/L (ref 20–29)
Calcium: 9.5 mg/dL (ref 8.7–10.3)
Chloride: 92 mmol/L — ABNORMAL LOW (ref 96–106)
Creatinine, Ser: 1.63 mg/dL — ABNORMAL HIGH (ref 0.57–1.00)
Globulin, Total: 3.8 g/dL (ref 1.5–4.5)
Glucose: 432 mg/dL — ABNORMAL HIGH (ref 70–99)
Potassium: 5.2 mmol/L (ref 3.5–5.2)
Sodium: 131 mmol/L — ABNORMAL LOW (ref 134–144)
Total Protein: 7.5 g/dL (ref 6.0–8.5)
eGFR: 35 mL/min/1.73 — ABNORMAL LOW (ref >59)

## 2023-11-14 LAB — MICROALBUMIN / CREATININE URINE RATIO
Creatinine, Urine: 145 mg/dL
Microalb/Creat Ratio: 265 mg/g{creat} — ABNORMAL HIGH (ref 0–29)
Microalbumin, Urine: 384.1 ug/mL

## 2023-11-14 NOTE — Telephone Encounter (Signed)
 FYI Only or Action Required?: Action required by provider: clinical question for provider and update on patient condition.  Patient was last seen in primary care on 11/13/2023 by Alvan Dorothyann BIRCH, MD.  Called Nurse Triage reporting Diarrhea. Took some Miralax for constipation a few days ago. Now having abd cramping and diarrhea  Symptoms began today.  Interventions attempted: Rest, hydration, or home remedies.  Symptoms are: gradually worsening.  Triage Disposition: See Physician Within 24 Hours- will try care advice provided by this RN (pepto bismal and starchy foods), but would like to hear from Dr. Alvan to see if there is more she would  suggest.   Patient/caregiver understands and will follow disposition?: Yes Copied from CRM #[id number]. Topic: Clinical - Red Word Triage >> Nov 14, 2023  9:11 AM Graeme ORN wrote: Red Word that prompted transfer to Nurse Triage: sick was seen yesterday - getting worse. Reason for Disposition  [1] MODERATE diarrhea (e.g., 4-6 times / day more than normal) AND [2] present > 48 hours (2 days)  Answer Assessment - Initial Assessment Questions 1. DIARRHEA SEVERITY: How bad is the diarrhea? How many more stools have you had in the past 24 hours than normal?      5 stools  2. ONSET: When did the diarrhea begin?      Started yesterday  3. STOOL DESCRIPTION:  How loose or watery is the diarrhea? What is the stool color? Is there any blood or mucous in the stool?     Loose, unsure if there is blood  4. VOMITING: Are you also vomiting? If Yes, ask: How many times in the past 24 hours?      No vomiting, but feels nausea  5. ABDOMEN PAIN: Are you having any abdomen pain? If Yes, ask: What does it feel like? (e.g., crampy, dull, intermittent, constant)      No cramps, just hurts  6. ABDOMEN PAIN SEVERITY: If present, ask: How bad is the pain?  (e.g., Scale 1-10; mild, moderate, or severe)     8/10 pain  7. ORAL INTAKE: If  vomiting, Have you been able to drink liquids? How much liquids have you had in the past 24 hours?     Drinking water okay 8. HYDRATION: Any signs of dehydration? (e.g., dry mouth [not just dry lips], too weak to stand, dizziness, new weight loss) When did you last urinate?     Feels dehydrated, last urinated a few moments ago  9. EXPOSURE: Have you traveled to a foreign country recently? Have you been exposed to anyone with diarrhea? Could you have eaten any food that was spoiled?     No  10. ANTIBIOTIC USE: Are you taking antibiotics now or have you taken antibiotics in the past 2 months?       No  11. OTHER SYMPTOMS: Do you have any other symptoms? (e.g., fever, blood in stool)       Not today, but endorses fever last night 12. PREGNANCY: Is there any chance you are pregnant? When was your last menstrual period?       No  Protocols used: Diarrhea-A-AH

## 2023-11-14 NOTE — Progress Notes (Signed)
 Glucose was 400 today it is really important that you are taking both of your insulins the long-acting and the short acting please continue to check your blood sugars through the weekend and give us  a call next week and let us  know how they are doing as we may need to make an adjustment in on that.  Also if you still have the Ozempic  left in your refrigerator please go ahead and start that as well.

## 2023-11-14 NOTE — Telephone Encounter (Signed)
 Spoke with Dr. Alvia as Dr. Alvan was in a meeting at time of patient call.  He recommended that patient go to ER again due to symptoms of nausea, vomiting, abdominal pain,  diarrhea- BS over 400.  He feels patient may be dehydrated and will likely need IV fluids.  Patient informed and patient will go to ER .  Patient will also inform her endocrinologist of her elevated BS results in office.

## 2023-11-14 NOTE — Telephone Encounter (Signed)
 Yes ok for pepto bismol,  and small cup of rice to help bind stool.  Need to give her insulin  today. Wha tis her sugar this AM

## 2023-11-14 NOTE — Telephone Encounter (Signed)
 Pt spoke to [facility] in Triage. Dr Alvia recommends pt returning to ER due to dry mouth and CBG 428 this AM.

## 2023-11-18 ENCOUNTER — Encounter (HOSPITAL_BASED_OUTPATIENT_CLINIC_OR_DEPARTMENT_OTHER): Payer: Self-pay | Admitting: Physical Therapy

## 2023-11-18 ENCOUNTER — Ambulatory Visit (HOSPITAL_BASED_OUTPATIENT_CLINIC_OR_DEPARTMENT_OTHER): Payer: Medicare (Managed Care) | Admitting: Physical Therapy

## 2023-11-18 DIAGNOSIS — M5459 Other low back pain: Secondary | ICD-10-CM | POA: Diagnosis not present

## 2023-11-18 DIAGNOSIS — M6281 Muscle weakness (generalized): Secondary | ICD-10-CM

## 2023-11-18 DIAGNOSIS — R2689 Other abnormalities of gait and mobility: Secondary | ICD-10-CM

## 2023-11-18 NOTE — Therapy (Signed)
 OUTPATIENT PHYSICAL THERAPY THORACOLUMBAR TREATMENT   Patient Name: Gabriela Guerrero MRN: [id number] DOB:August 15, 1960, 63 y.o., female Today's Date: 11/18/2023  END OF SESSION:  PT End of Session - 11/18/23 0932     Visit Number 4    Number of Visits 20    Date for PT Re-Evaluation 12/19/23    Authorization Type medicare    PT Start Time 0932    PT Stop Time 1010    PT Time Calculation (min) 38 min    Behavior During Therapy [facility] for tasks assessed/performed           Past Medical History:  Diagnosis Date   Hypertension    Past Surgical History:  Procedure Laterality Date   LUMBAR FUSION     Patient Active Problem List   Diagnosis Date Noted   Migraine 09/24/2022   Decreased activities of daily living (ADL) 05/05/2022   Tongue lesion 03/25/2022   Right hip pain 01/02/2022   Chronic pain of right knee 01/02/2022   Breast pain, right 05/14/2021   Failed back syndrome of lumbar spine 08/15/2020   Persistent dyspnea after COVID-19 05/25/2020   Stage 3a chronic kidney disease (HCC) 07/30/2019   MDD (major depressive disorder) 07/12/2019   Moderate mitral regurgitation 06/24/2019   Insomnia 06/01/2018   S/P lumbar spinal fusion 09/01/2017   Uncontrolled type 2 diabetes mellitus with hyperglycemia, with long-term current use of insulin  (HCC) 01/29/2017   Hypertension associated with diabetes (HCC) 01/26/2017   Diabetic retinopathy (HCC) 04/13/2013   GERD (gastroesophageal reflux disease) 07/23/2010    PCP: Velma Ku DO  REFERRING PROVIDER: Curtis Debby PARAS, MD   REFERRING DIAG: M96.1 (ICD-10-CM) - Failed back syndrome of lumbar spine   Rationale for Evaluation and Treatment: Rehabilitation  THERAPY DIAG:  Other low back pain  Muscle weakness (generalized)  Other abnormalities of gait and mobility  ONSET DATE: Chronic  SUBJECTIVE:                                                                                                                                                                                            SUBJECTIVE STATEMENT: Pt reports another ER trip due to constipation and dehydration.  She arrives after walking with rollator from lobby. She reports she has some relief when in the water.  She would like to be able to complete steps or go up curb (currently cannot).  She reports she is having cataract surgery this week and will be away from pool for a few weeks while she heals.    Initial Subjective Lumbar fusion 2019. No pain improvement.  Oa knee.Has had aquatic therapy in past.  Had injections in past that did not work.  Pt reports being in bed most of time as that is where I feel the best.  Had Home care earlier this year and have been doing the exercises they gave me using bands, exercises standing.  Hurts but feels good. Pain is constant and high.  PERTINENT HISTORY:  As per FI:Olfajm spondylosis  Failed back syndrome of lumbar spine:63 year old female with diabetes, morbid obesity, chronic low back pain, she is status post an L4-L5 fusion with persistent pain, she endorses she got no relief not even temporary. She does have a CT scan from 2 years ago that does show what appears to be a stable fusion, she has adjacent severe facet arthropathy, pain is better with flexion. . . . multilevel facet joint injections, likely right sided L3-L4 and right sided L5-S1.     PAIN:  Are you having pain? Yes: NPRS scale: current 8/10 Pain location: R LB with radiation into toes Pain description: sharp pain to the touch, ache Aggravating factors: car riding, walking, standing, transfers Relieving factors: meds; sitting or lying down with legs elevated  PRECAUTIONS: Fall  RED FLAGS: None   WEIGHT BEARING RESTRICTIONS: No  FALLS:  Has patient fallen in last 6 months? 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  DIAGNOSTIC FINDINGS:  None recent  PATIENT SURVEYS:  Modified Oswestry 38/50=76%   COGNITION: Overall cognitive status: Within functional limits for tasks assessed     SENSATION: Radicular pain into rle>Left to feet   POSTURE: decreased lumbar lordosis and flexed trunk   PALPATION: Moderate TTP Lumbar spine to hips Hyperesthesia throughout rle  LUMBAR ROM:   Limited by at least 75%   LOWER EXTREMITY MMT:    MMT Right eval Left eval  Hip flexion 2+P! 3P!  Hip extension    Hip abduction    Hip adduction    Hip internal rotation    Hip external rotation    Knee flexion 3 3  Knee extension 3 3  Ankle dorsiflexion    Ankle plantarflexion    Ankle inversion    Ankle eversion     (Blank rows = not tested)   FUNCTIONAL TESTS:  2 minute WT:54ft using rollator  GAIT: Distance walked: 300 ft to setting 2 standing rest periods Assistive device utilized: Walker - 4 wheeled Level of assistance: SBA Comments: antalgic, dragging heels, guarded posture, leaning on forearms  TREATMENT  [facility] Adult PT Treatment:                                                DATE: 11/18/23 Pt seen for aquatic therapy today.  Treatment took place in water 3.5-4.75 ft in depth at the [facility] pool. Temp of water was 91.  Pt entered/exited the pool via lift.   *seated on lift: hip add/abd; LAQ; cycling * forward walk with UE on barbell across the pool * UE on wall:  relaxed squat * side stepping Lt  to 4 ft * hip extension to toe touch 2 x 3 each LE * marching  * relaxed squat * Lt toe tap to step in water at 92ft x 2; Lt forward step up with UE support x 1 rep (Rt lateral step down  with BUE on wall) * Rt/Lt hip abdct to toe tap 2 x 3  * relaxed squat  * one UE on wall and one UE on float, walking forward/ backward 4 ft x 2 reps * relaxed squat * UE on blue hand floats walking forward across width of pool to chair   Pt requires the buoyancy and hydrostatic pressure of water for support, and to offload joints by unweighting joint load by at least 50 % in navel deep water and by at least 75-80% in chest to neck deep water.  Viscosity of the water is needed for resistance of strengthening. Water current perturbations provides challenge to standing balance requiring increased core activation.                                                                                                                                    PATIENT EDUCATION:  Education details: intro to aquatic therapy  Person educated: Patient Education method: Explanation Education comprehension: verbalized understanding  HOME EXERCISE PROGRAM: TBA  ASSESSMENT:  CLINICAL IMPRESSION: Pt to ER for dehydration again. She arrives to session walking to pool by herself but requested transport following session .  She requires multiple rest periods throughout session, in squatted position. She reports relief to 5/10 when in squatted position submerged 80%. She is observed moving in very guarded manner when walking across width of pool; improved tolerance for walking with one hand on wall at side of pool.  Used lift in and out for safety and improved toleration to session today; pt has very difficult time ascending / descending step in 42ft of water.   Goals are ongoing.     Initial Impression Patient is a 63 y.o. f who was seen today for physical therapy evaluation and treatment for failed Back Syndrome of Lumbar spine. She presents by herself using transportation services to get here.  She does walk back to setting but with difficulty needing to rest at least x 2 pushing rollator.  She reports significant  pain for past several years following an unsuccessful lumbar surgery.  She is highly pain sensitive with all movement as well as exhibiting symptoms of hyperesthesia throughout rle.  She was limited due to pain with assessment today.  She is a good candidate for skilled PT with concentration initially in aquatics.  Will trial 1 x a week to build tolerance to level of activity required to participate (transportation, distance to and from setting, changing clothes and exercise) then increase to 2 x week.  She is extremely limited in all functional mobility and ADL's due to deficits. She is a good candidate for aquatic intervention and will  benefit from the properties of water to progress towards functional goals. She is agreement with POC. Will need lift for entrance/exit to pool  OBJECTIVE IMPAIRMENTS: Abnormal gait, decreased activity tolerance, decreased balance, decreased endurance, decreased mobility, difficulty walking, decreased ROM, decreased strength, increased muscle spasms, impaired sensation, postural dysfunction, obesity, and pain.   ACTIVITY LIMITATIONS: carrying, lifting, bending, sitting, standing, squatting, sleeping, stairs, transfers, bed mobility, bathing, dressing, hygiene/grooming, locomotion level, and caring for others  PARTICIPATION LIMITATIONS: meal prep, cleaning, laundry, driving, shopping, community activity, occupation, and yard work  PERSONAL FACTORS: Fitness, Past/current experiences, Time since onset of injury/illness/exacerbation, and 3+ comorbidities: See Pmhx are also affecting patient's functional outcome.   REHAB POTENTIAL: Fair due to co-morbidities  CLINICAL DECISION MAKING: Unstable/unpredictable  EVALUATION COMPLEXITY: High   GOALS: Goals reviewed with patient? Yes  SHORT TERM GOALS: Target date: 11/17/23  Pt will tolerate full aquatic sessions consistently without increase in pain and with improving function to demonstrate good toleration and  effectiveness of intervention.  Baseline: Goal status: IN PROGRESS - 11/18/23  2.  Pt will amb x 10 minutes continuously submerged to demonstrate improving endurance Baseline:  Goal status: INITIAL  3.  Pt will perform STS from pool bench x 10 without LOB Baseline:  Goal status: INITIAL   LONG TERM GOALS: Target date: 12/19/23  Pt to improve on ODI by  up to 10% % to demonstrate statistically significant Improvement in function. Baseline: 38/50=76%  Goal status: INITIAL  2.  Pt will report decreased frequency of waking at night by 50% Baseline:  Goal status: INITIAL  3.  Pt will tolerate walking to and from setting and engaging in aquatic therapy session without excessive fatigue or increase in pain to demonstrate improved toleration to activity. Baseline:  Goal status: INITIAL  4.  Pt will tolerate stair climbing into and exiting pool x 7 steps ascending and descending 6 steps without use of handrail Baseline:  Goal status: INITIAL  5.  Pt will report decrease in pain by at least 50% for improved toleration to activity/quality of life and to demonstrate improved management of pain. Baseline:  Goal status: INITIAL  6.  Pt will be indep with final HEP's (land and aquatic as appropriate) for continued management of condition Baseline:  Goal status: INITIAL  PLAN:  PT FREQUENCY: 1-2x/week  PT DURATION: 10 weeks  PLANNED INTERVENTIONS: 97164- PT Re-evaluation, 97750- Physical Performance Testing, 97110-Therapeutic exercises, 97530- Therapeutic activity, 97112- Neuromuscular re-education, 97535- Self Care, 02859- Manual therapy, [id number]- Gait training, [phone]- Orthotic Initial, [phone]- Aquatic Therapy, [phone]- Electrical stimulation (unattended), [phone]- Electrical stimulation (manual), [id number]- Ionotophoresis 4mg /ml Dexamethasone , 79439 (1-2 muscles), 20561 (3+ muscles)- Dry Needling, Patient/Family education, Balance training, Stair training, Taping, Joint mobilization, DME  instructions, Cryotherapy, and Moist heat.  PLAN FOR NEXT SESSION: aquatics: trial toleration; general strengthening and stretching; balance and gait training, pain management  Delon Aquas, PTA 11/18/23 11:53 AM [facility] [address] [place], [place], [address] Phone: [phone]   Fax:  [phone]

## 2023-11-19 ENCOUNTER — Telehealth: Payer: Self-pay

## 2023-11-19 MED ORDER — AMLODIPINE BESYLATE-VALSARTAN 10-320 MG PO TABS
1.0000 | ORAL_TABLET | Freq: Every day | ORAL | 0 refills | Status: DC
Start: 2023-11-19 — End: 2024-02-23

## 2023-11-19 NOTE — Progress Notes (Unsigned)
   11/19/2023  Patient ID: Gabriela Guerrero, female   DOB: Aug 26, 1960, 63 y.o.   MRN: [id number]  Patient outreach to schedule telephone follow-up visit with patient after recent hospitalizations.  Telephone visit scheduled for next Tuesday at 1030am.  Patient also mentioned she recently switched back to [facility] from [place], and they last filled amlodipine  versus amlodipine /valsartan  10/320mg , which she should now be on.  Coordinating with PCP to send a 1 month supply with no additional refills to [facility].  Patient sees Dr. Alvia again 8/20 but is not currently scheduled for a follow-up with cardiology.    Channing DELENA Mealing, PharmD, DPLA

## 2023-11-20 ENCOUNTER — Ambulatory Visit (HOSPITAL_BASED_OUTPATIENT_CLINIC_OR_DEPARTMENT_OTHER): Payer: Medicare (Managed Care) | Admitting: Physical Therapy

## 2023-11-20 ENCOUNTER — Other Ambulatory Visit: Payer: Self-pay | Admitting: Family Medicine

## 2023-11-20 DIAGNOSIS — H2511 Age-related nuclear cataract, right eye: Secondary | ICD-10-CM | POA: Diagnosis not present

## 2023-11-23 ENCOUNTER — Other Ambulatory Visit: Payer: Self-pay | Admitting: Sports Medicine

## 2023-11-23 DIAGNOSIS — E1165 Type 2 diabetes mellitus with hyperglycemia: Secondary | ICD-10-CM

## 2023-11-25 ENCOUNTER — Ambulatory Visit (HOSPITAL_BASED_OUTPATIENT_CLINIC_OR_DEPARTMENT_OTHER): Payer: Medicare (Managed Care) | Admitting: Physical Therapy

## 2023-11-25 ENCOUNTER — Other Ambulatory Visit: Payer: Medicare (Managed Care)

## 2023-11-25 NOTE — Progress Notes (Unsigned)
   11/25/2023  Patient ID: Gabriela Guerrero, female   DOB: August 05, 1960, 63 y.o.   MRN: [id number]  Outreach attempt for scheduled telephone follow-up visit was not successful, and I was not able to leave a voicemail.  Sending patient a MyChart message to attempt to reschedule.    Gabriela Guerrero, PharmD, DPLA

## 2023-11-27 ENCOUNTER — Ambulatory Visit (HOSPITAL_BASED_OUTPATIENT_CLINIC_OR_DEPARTMENT_OTHER): Payer: Medicare (Managed Care) | Admitting: Physical Therapy

## 2023-12-02 ENCOUNTER — Ambulatory Visit (HOSPITAL_BASED_OUTPATIENT_CLINIC_OR_DEPARTMENT_OTHER): Payer: Medicare (Managed Care) | Admitting: Physical Therapy

## 2023-12-04 ENCOUNTER — Ambulatory Visit (HOSPITAL_BASED_OUTPATIENT_CLINIC_OR_DEPARTMENT_OTHER): Payer: Medicare (Managed Care) | Admitting: Physical Therapy

## 2023-12-06 ENCOUNTER — Other Ambulatory Visit: Payer: Self-pay | Admitting: Family Medicine

## 2023-12-06 DIAGNOSIS — E1159 Type 2 diabetes mellitus with other circulatory complications: Secondary | ICD-10-CM

## 2023-12-09 ENCOUNTER — Telehealth: Payer: Self-pay

## 2023-12-09 DIAGNOSIS — H2512 Age-related nuclear cataract, left eye: Secondary | ICD-10-CM | POA: Diagnosis not present

## 2023-12-09 NOTE — Telephone Encounter (Signed)
 Copied from CRM #[id number]. Topic: Appointments - Scheduling Inquiry for Clinic >> Dec 08, 2023  3:54 PM Miquel SAILOR wrote: Reason for CRM: Patient has Telephone app for PCP needs to cancel. Transferred to location   DR. [facility] [facility] [address] [place],  ZIP [address] Phone: [phone]

## 2023-12-10 ENCOUNTER — Ambulatory Visit: Payer: Medicare (Managed Care) | Admitting: Family Medicine

## 2023-12-10 DIAGNOSIS — H2512 Age-related nuclear cataract, left eye: Secondary | ICD-10-CM | POA: Diagnosis not present

## 2023-12-12 ENCOUNTER — Other Ambulatory Visit: Payer: Self-pay

## 2023-12-12 DIAGNOSIS — I152 Hypertension secondary to endocrine disorders: Secondary | ICD-10-CM

## 2023-12-12 NOTE — Progress Notes (Unsigned)
   12/12/2023  Patient ID: Gabriela Guerrero, female   DOB: 10/29/1960, 63 y.o.   MRN: [id number]  Subjective/Objective Telephone visit to follow-up on management of chronic conditions  Diabetes: Current medications: novolog  12 units TID cf, Mounjaro  2.5mg  weekly, Toujeo  64 units daily -Medications tried in the past: Ozempic   -Patient recently had to hold Mounjaro  2.5mg  due to eye surgery, but she will resume regular weekly dose this coming Sunday.  She does endorse constipation with this medication but is using a daily stool softener as well as daily Miralax, which are helping. -A1c recently elevated at 11.2% 2 weeks ago- this was 8.5% 06/18/2023 -FBG remains elevated with readings 150-190 -Using True Metrix meter; testing 1-2 times daily -Patient has started aquatic therapy and can resume 2 weeks after eye procedure -Endorses eating less with being on Mounjaro  -Plans to start walking outdoors again once weather is cooler  Hypertension: Current medications: amlodipine /valsartan  10/320mg  daily, carvedilol  25mg  BID, furosemide  20mg  daily PRN, clonidine  0.1mg  TID -Patient states an aid comes to home on occasion and checked her BP yesterday but did not provide reading -Current blood pressure readings readings: last OV reading 142/60 -Requesting a refill on carvedilol  25mg  and furosemide  20mg  -Endorses good adherence to medication regimen   Hyperlipidemia/ASCVD Risk Reduction Current lipid lowering medications: atorvastatin  40mg  daily Antiplatelet regimen: ASA 81mg  daily -Endorses good adherence to medication regimen   Assessment/Plan:    Diabetes: -Currently uncontrolled -Sees Dr. Alvia again 9/8 -Just refilled 84 day supply of Mounjaro  2.5mg  this month- based on this and constipation with medication, I don't recommend increasing dose for at least another month.  Patient does need additional BG lowering, though- consider increasing insulin  TDD by 10% (Increase Toujeo  to 68 units and Novolog   to 14 units TID before meals) at least until we can titrate Mounjaro  -Continue stool softener and Miralax, increase water intake -Continue to increase physical activity as able -Decrease carbohydrates and sugar, increase lean protein, fruits, and vegeables -Once A1c <9%,  verify no ketones in urine, no frequent UTI's or genital yeast infections- if not, I recommend addition of SGLT2 to lower A1c, protect kidneys, and possibly lower BP some   Hypertension: -Currently uncontrolled -Advised patient to have aid write down home BP readings when checked -If consistently >130/80, I recommend addition of thiazide diuretic, such as chlorthalidone 25mg  daily- rechecking BMP and BP in 2 weeks   Hyperlipidemia/ASCVD Risk Reduction: -Currently uncontrolled with LDL >70 -Recommend follow-up lipid panel; if LDL remains >70, increase atorvastatin  to 80mg  daily   Follow Up Plan:  9/22   Gabriela Guerrero, PharmD, DPLA

## 2023-12-16 MED ORDER — FUROSEMIDE 20 MG PO TABS: 20.0000 mg | ORAL_TABLET | Freq: Every day | ORAL | 0 refills | Status: DC | PRN

## 2023-12-16 MED ORDER — CARVEDILOL 25 MG PO TABS: 25.0000 mg | ORAL_TABLET | Freq: Two times a day (BID) | ORAL | 0 refills | Status: DC

## 2023-12-17 ENCOUNTER — Telehealth: Payer: Self-pay

## 2023-12-17 NOTE — Telephone Encounter (Signed)
 Patient was identified as falling into the [place] Measure - Diabetes.   Patient was: Appointment already scheduled for:  12/29/2023.

## 2023-12-23 ENCOUNTER — Encounter (HOSPITAL_BASED_OUTPATIENT_CLINIC_OR_DEPARTMENT_OTHER): Payer: Self-pay | Admitting: Physical Therapy

## 2023-12-23 ENCOUNTER — Encounter: Payer: Self-pay | Admitting: Sports Medicine

## 2023-12-23 ENCOUNTER — Ambulatory Visit (HOSPITAL_BASED_OUTPATIENT_CLINIC_OR_DEPARTMENT_OTHER): Payer: Medicare (Managed Care) | Attending: Sports Medicine | Admitting: Physical Therapy

## 2023-12-23 DIAGNOSIS — M6281 Muscle weakness (generalized): Secondary | ICD-10-CM | POA: Insufficient documentation

## 2023-12-23 DIAGNOSIS — R2689 Other abnormalities of gait and mobility: Secondary | ICD-10-CM | POA: Insufficient documentation

## 2023-12-23 DIAGNOSIS — Z7689 Persons encountering health services in other specified circumstances: Secondary | ICD-10-CM | POA: Diagnosis not present

## 2023-12-23 DIAGNOSIS — M5459 Other low back pain: Secondary | ICD-10-CM | POA: Insufficient documentation

## 2023-12-23 NOTE — Therapy (Signed)
 OUTPATIENT PHYSICAL THERAPY THORACOLUMBAR TREATMENT Progress Note Reporting Period 10/08/23 to 12/23/23  See note below for Objective Data and Assessment of Progress/Goals.       Patient Name: Gabriela Guerrero MRN: [id number] DOB:[birth year], 63 y.o., female Today's Date: 12/23/2023  END OF SESSION:  PT End of Session - 12/23/23 1022     Visit Number 5    Number of Visits 20    Date for PT Re-Evaluation 02/13/24    Authorization Type medicare    PT Start Time 1019    PT Stop Time 1059    PT Time Calculation (min) 40 min    Activity Tolerance Patient tolerated treatment well    Behavior During Therapy [facility] for tasks assessed/performed           Past Medical History:  Diagnosis Date   Hypertension    Past Surgical History:  Procedure Laterality Date   LUMBAR FUSION     Patient Active Problem List   Diagnosis Date Noted   Migraine 09/24/2022   Decreased activities of daily living (ADL) 05/05/2022   Tongue lesion 03/25/2022   Right hip pain 01/02/2022   Chronic pain of right knee 01/02/2022   Breast pain, right 05/14/2021   Failed back syndrome of lumbar spine 08/15/2020   Persistent dyspnea after COVID-19 05/25/2020   Stage 3a chronic kidney disease (HCC) 07/30/2019   MDD (major depressive disorder) 07/12/2019   Moderate mitral regurgitation 06/24/2019   Insomnia 06/01/2018   S/P lumbar spinal fusion 09/01/2017   Uncontrolled type 2 diabetes mellitus with hyperglycemia, with long-term current use of insulin  (HCC) 01/29/2017   Hypertension associated with diabetes (HCC) 01/26/2017   Diabetic retinopathy (HCC) 04/13/2013   GERD (gastroesophageal reflux disease) 07/23/2010    PCP: Velma Ku DO  REFERRING PROVIDER: Curtis Debby PARAS, MD   REFERRING DIAG: M96.1 (ICD-10-CM) - Failed back syndrome of lumbar spine   Rationale for Evaluation and Treatment: Rehabilitation  THERAPY DIAG:  Other low back pain  Muscle weakness (generalized)  Other abnormalities  of gait and mobility  ONSET DATE: Chronic  SUBJECTIVE:                                                                                                                                                                                           SUBJECTIVE STATEMENT: Cataract surgery both eyes.  She has recovered from her ER stint due to dehydration.  Pain same 8/10  Initial Subjective Lumbar fusion 2019. No pain improvement.  Oa knee.Has had aquatic therapy in past. Had injections in past that did not work.  Pt reports being in bed most of time as that is  where I feel the best.  Had Home care earlier this year and have been doing the exercises they gave me using bands, exercises standing.  Hurts but feels good. Pain is constant and high.  PERTINENT HISTORY:  As per FI:Olfajm spondylosis  Failed back syndrome of lumbar spine:63 year old female with diabetes, morbid obesity, chronic low back pain, she is status post an L4-L5 fusion with persistent pain, she endorses she got no relief not even temporary. She does have a CT scan from 2 years ago that does show what appears to be a stable fusion, she has adjacent severe facet arthropathy, pain is better with flexion. . . . multilevel facet joint injections, likely right sided L3-L4 and right sided L5-S1.     PAIN:  Are you having pain? Yes: NPRS scale: current 8/10 Pain location: R LB with radiation into toes Pain description: sharp pain to the touch, ache Aggravating factors: car riding, walking, standing, transfers Relieving factors: meds; sitting or lying down with legs elevated  PRECAUTIONS: Fall  RED FLAGS: None   WEIGHT BEARING RESTRICTIONS: No  FALLS:  Has patient fallen in last 6 months? 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  DIAGNOSTIC FINDINGS:  None recent  PATIENT SURVEYS:  Modified Oswestry 38/50=76%   COGNITION: Overall cognitive status: Within functional limits for tasks assessed     SENSATION: Radicular pain into rle>Left to feet   POSTURE: decreased lumbar lordosis and flexed trunk   PALPATION: Moderate TTP Lumbar spine to hips Hyperesthesia throughout rle  LUMBAR ROM:   Limited by at least 75% 12/23/23: no change  LOWER EXTREMITY MMT:    MMT Right eval Left eval R / L 9/2  Hip flexion 2+P! 3P! 3P! / 3+P!  Hip extension     Hip abduction     Hip adduction     Hip internal rotation     Hip external rotation     Knee flexion 3 3 3  / 3+  Knee extension 3 3 3  / 4-  Ankle dorsiflexion     Ankle plantarflexion     Ankle inversion     Ankle eversion      (Blank rows = not tested)   FUNCTIONAL TESTS:  2 minute WT:54ft using rollator 12/23/23: 50 ft using rollator (initial fatigue as pt has already walked 500 ft from parking lot)   GAIT: Distance walked: 300 ft to setting 2 standing rest periods Assistive device utilized: Walker - 4 wheeled Level of assistance: SBA Comments: antalgic, dragging heels, guarded posture, leaning on forearms  TREATMENT  [facility] Adult PT Treatment:                                                DATE: 12/23/23  Re-assess: muscle testing; LE and lumbar ROM; 2 MWT   Pt seen for aquatic therapy today.  Treatment took place in water 3.5-4.75 ft in depth at the [facility] pool. Temp of water was 91.  Pt entered/exited  the pool via lift.   *seated on lift: hip add/abd; LAQ; cycling * side stepping ue support wall  - squatted rest period *standing ue support wall: tr; hr; hip add/an\bd; relaxed squats  - squatted rest period  Pt  requires the buoyancy and hydrostatic pressure of water for support, and to offload joints by unweighting joint load by at least 50 % in navel deep water and by at least 75-80% in chest to neck deep water.  Viscosity of the water is needed for resistance of strengthening. Water current perturbations provides challenge to standing balance requiring increased core activation.                                                                                                                                    PATIENT EDUCATION:  Education details: intro to aquatic therapy  Person educated: Patient Education method: Explanation Education comprehension: verbalized understanding  HOME EXERCISE PROGRAM: TBA  ASSESSMENT:  CLINICAL IMPRESSION: PN: pt seen for just a few sessions due to an illness and then eye surgery.  She returns today with reports of no changes.  She reports 1 fall since last seen when she tried to sit on a chair with wheels and it moved at a [employer].  She did not need medical attention. She amb to setting today with 3 standing rest periods.  Requires WC transport to exit. Her pain levels, ROM of lumbar spine and toleration to activity has not changed. She does although, demonstrate a small improvement in le strength L>R. She reports she has had pain relief from the few session of aquatic therapy while submerged but has had difficulty with the weight of gravity once exiting pool.  She continues to require lift for entering and exit pools for safety. Pt continues to be extremely limited in all functional mobility and ADL's due to deficits. She is motivated to attend aquatic therapy session and  will benefit from the properties of water to progress towards functional goals.     Initial Impression Patient is a 63 y.o. f who was seen today for physical therapy evaluation and treatment for failed Back Syndrome of Lumbar spine. She presents by herself using transportation services to get  here.  She does walk back to setting but with difficulty needing to rest at least x 2 pushing rollator.  She reports significant pain for past several years following an unsuccessful lumbar surgery.  She is highly pain sensitive with all movement as well as exhibiting symptoms of hyperesthesia throughout rle.  She was limited due to pain with assessment today.  She is a good candidate for skilled PT with concentration initially in aquatics.  Will trial 1 x a week to build tolerance to level of activity required to participate (transportation, distance to and from setting, changing clothes and exercise) then increase to 2 x week.  She is extremely limited in all functional mobility and ADL's due to  deficits. She is a good candidate for aquatic intervention and will benefit from the properties of water to progress towards functional goals. She is agreement with POC. Will need lift for entrance/exit to pool  OBJECTIVE IMPAIRMENTS: Abnormal gait, decreased activity tolerance, decreased balance, decreased endurance, decreased mobility, difficulty walking, decreased ROM, decreased strength, increased muscle spasms, impaired sensation, postural dysfunction, obesity, and pain.   ACTIVITY LIMITATIONS: carrying, lifting, bending, sitting, standing, squatting, sleeping, stairs, transfers, bed mobility, bathing, dressing, hygiene/grooming, locomotion level, and caring for others  PARTICIPATION LIMITATIONS: meal prep, cleaning, laundry, driving, shopping, community activity, occupation, and yard work  PERSONAL FACTORS: Fitness, Past/current experiences, Time since onset of injury/illness/exacerbation, and 3+ comorbidities: See Pmhx are also affecting patient's functional outcome.   REHAB POTENTIAL: Fair due to co-morbidities  CLINICAL DECISION MAKING: Unstable/unpredictable  EVALUATION COMPLEXITY: High   GOALS: Goals reviewed with patient? Yes  SHORT TERM GOALS: Target date: 11/17/23 extended 01/20/24  Pt  will tolerate full aquatic sessions consistently without increase in pain and with improving function to demonstrate good toleration and effectiveness of intervention.  Baseline: Goal status: IN PROGRESS - 11/18/23  2.  Pt will amb x 10 minutes continuously submerged to demonstrate improving endurance Baseline:  Goal status: INITIAL  3.  Pt will perform STS from pool bench x 10 without LOB Baseline:  Goal status: INITIAL   LONG TERM GOALS: Target date: 02/13/24  Pt to improve on ODI by  up to 10% % to demonstrate statistically significant Improvement in function. Baseline: 38/50=76%  Goal status: INITIAL  2.  Pt will report decreased frequency of waking at night by 50% Baseline:  Goal status: INITIAL  3.  Pt will tolerate walking to and from setting and engaging in aquatic therapy session without excessive fatigue or increase in pain to demonstrate improved toleration to activity. Baseline:  Goal status: INITIAL  4.  Pt will tolerate stair climbing into and exiting pool x 7 steps ascending and descending 6 steps without use of handrail Baseline:  Goal status: INITIAL  5.  Pt will report decrease in pain by at least 50% for improved toleration to activity/quality of life and to demonstrate improved management of pain. Baseline:  Goal status: INITIAL  6.  Pt will be indep with final HEP's (land and aquatic as appropriate) for continued management of condition Baseline:  Goal status: INITIAL  PLAN:  PT FREQUENCY: 1-2x/week  PT DURATION: 8 weeks  PLANNED INTERVENTIONS: 97164- PT Re-evaluation, 97750- Physical Performance Testing, 97110-Therapeutic exercises, 97530- Therapeutic activity, [id number]- Neuromuscular re-education, 97535- Self Care, 02859- Manual therapy, [id number]- Gait training, [phone]- Orthotic Initial, [phone]- Aquatic Therapy, [phone]- Electrical stimulation (unattended), [phone]- Electrical stimulation (manual), [id number]- Ionotophoresis 4mg /ml Dexamethasone , 79439 (1-2 muscles),  20561 (3+ muscles)- Dry Needling, Patient/Family education, Balance training, Stair training, Taping, Joint mobilization, DME instructions, Cryotherapy, and Moist heat.  PLAN FOR NEXT SESSION: aquatics: trial toleration; general strengthening and stretching; balance and gait training, pain management  Ronal Foots) Shihab States MPT 12/23/23 12:38 PM [facility] [address] [place], [place], [address] Phone: [phone]   Fax:  [phone]

## 2023-12-25 ENCOUNTER — Ambulatory Visit: Payer: Medicare (Managed Care) | Admitting: Family Medicine

## 2023-12-25 ENCOUNTER — Ambulatory Visit (HOSPITAL_BASED_OUTPATIENT_CLINIC_OR_DEPARTMENT_OTHER): Payer: Medicare (Managed Care) | Admitting: Physical Therapy

## 2023-12-25 ENCOUNTER — Encounter (HOSPITAL_BASED_OUTPATIENT_CLINIC_OR_DEPARTMENT_OTHER): Payer: Self-pay | Admitting: Physical Therapy

## 2023-12-25 DIAGNOSIS — M6281 Muscle weakness (generalized): Secondary | ICD-10-CM

## 2023-12-25 DIAGNOSIS — R2689 Other abnormalities of gait and mobility: Secondary | ICD-10-CM

## 2023-12-25 DIAGNOSIS — M5459 Other low back pain: Secondary | ICD-10-CM

## 2023-12-25 NOTE — Therapy (Signed)
 OUTPATIENT PHYSICAL THERAPY THORACOLUMBAR TREATMENT   Patient Name: Gabriela Guerrero MRN: [id number] DOB:[birth year], 63 y.o., female Today's Date: 12/25/2023  END OF SESSION:  PT End of Session - 12/25/23 0938     Visit Number 6    Number of Visits 20    Date for PT Re-Evaluation 02/13/24    Authorization Type medicare    PT Start Time 0930    PT Stop Time 1010    PT Time Calculation (min) 40 min    Behavior During Therapy [facility] for tasks assessed/performed           Past Medical History:  Diagnosis Date   Hypertension    Past Surgical History:  Procedure Laterality Date   LUMBAR FUSION     Patient Active Problem List   Diagnosis Date Noted   Migraine 09/24/2022   Decreased activities of daily living (ADL) 05/05/2022   Tongue lesion 03/25/2022   Right hip pain 01/02/2022   Chronic pain of right knee 01/02/2022   Breast pain, right 05/14/2021   Failed back syndrome of lumbar spine 08/15/2020   Persistent dyspnea after COVID-19 05/25/2020   Stage 3a chronic kidney disease (HCC) 07/30/2019   MDD (major depressive disorder) 07/12/2019   Moderate mitral regurgitation 06/24/2019   Insomnia 06/01/2018   S/P lumbar spinal fusion 09/01/2017   Uncontrolled type 2 diabetes mellitus with hyperglycemia, with long-term current use of insulin  (HCC) 01/29/2017   Hypertension associated with diabetes (HCC) 01/26/2017   Diabetic retinopathy (HCC) 04/13/2013   GERD (gastroesophageal reflux disease) 07/23/2010    PCP: Velma Ku DO  REFERRING PROVIDER: Curtis Debby PARAS, MD   REFERRING DIAG: M96.1 (ICD-10-CM) - Failed back syndrome of lumbar spine   Rationale for Evaluation and Treatment: Rehabilitation  THERAPY DIAG:  Other low back pain  Muscle weakness (generalized)  Other abnormalities of gait and mobility  ONSET DATE: Chronic  SUBJECTIVE:                                                                                                                                                                                            SUBJECTIVE STATEMENT: Pt reports she is in pain from walking from lobby.  She is looking forward to getting in pool.    Initial Subjective Lumbar fusion 2019. No pain improvement.  Oa knee.Has had aquatic therapy in past. Had injections in past that did not work.  Pt reports being in bed most of time as that is where I feel the best.  Had Home care earlier this year and have been doing the exercises they gave me using bands, exercises standing.  Hurts but feels  good. Pain is constant and high.  PERTINENT HISTORY:  As per FI:Olfajm spondylosis  Failed back syndrome of lumbar spine:63 year old female with diabetes, morbid obesity, chronic low back pain, she is status post an L4-L5 fusion with persistent pain, she endorses she got no relief not even temporary. She does have a CT scan from 2 years ago that does show what appears to be a stable fusion, she has adjacent severe facet arthropathy, pain is better with flexion. . . . multilevel facet joint injections, likely right sided L3-L4 and right sided L5-S1.     PAIN:  Are you having pain? Yes: NPRS scale: current 8-9/10 Pain location: R LB with radiation into toes Pain description: sharp pain to the touch, ache Aggravating factors: car riding, walking, standing, transfers Relieving factors: meds; sitting or lying down with legs elevated  PRECAUTIONS: Fall  RED FLAGS: None   WEIGHT BEARING RESTRICTIONS: No  FALLS:  Has patient fallen in last 6 months? 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  DIAGNOSTIC FINDINGS:  None recent  PATIENT SURVEYS:  Modified Oswestry 38/50=76%   COGNITION: Overall cognitive status: Within functional limits for tasks assessed     SENSATION: Radicular pain into rle>Left to feet   POSTURE: decreased lumbar lordosis and flexed trunk   PALPATION: Moderate TTP Lumbar spine to hips Hyperesthesia throughout rle  LUMBAR ROM:   Limited by at least 75%   LOWER EXTREMITY MMT:    MMT Right eval Left eval  Hip flexion 2+P! 3P!  Hip extension    Hip abduction    Hip adduction    Hip internal rotation    Hip external rotation    Knee flexion 3 3  Knee extension 3 3  Ankle dorsiflexion    Ankle plantarflexion    Ankle inversion    Ankle eversion     (Blank rows = not tested)   FUNCTIONAL TESTS:  2 minute WT:65ft using rollator  GAIT: Distance walked: 300 ft to setting 2 standing rest periods Assistive device utilized: Walker - 4 wheeled Level of assistance: SBA Comments: antalgic, dragging heels, guarded posture, leaning on forearms  TREATMENT  [facility] Adult PT Treatment:                                                DATE: 12/25/23 Pt seen for aquatic therapy today.  Treatment took place in water 3.5-4.75 ft in depth at the [facility] pool. Temp of water was 91.  Pt entered/exited the pool via lift.  *seated on lift: hip add/abd; LAQ;  * UE on wall: side stepping Rt to 4 ft;  relaxed squat; marching in place;  hip abdct to toe touch x 5 each; hip ext to toe touch x 5 each; relaxed squat;  heel raises x 8; toe raises x 8  * forward/ backward gait next to wall, LUE on wall/RUE on hand float -> bil on yellow hand floats * relaxed squat at wall  *next  to wall  bil on yellow hand floats: forward/ backward gait; side stepping R/L   * relaxed squat at wall * L side step up onto blue step, to sit on bench in water- rest - sit to stand with UE on yellow hand floats and CGA; Rt step down off of  step.  * side stepping to lift chair  * after dried off: applied reg Rock tape to Rt thigh to desensitize area. Pt instructed on safe tape removal technique and verbalized understanding.    Pt requires the buoyancy and hydrostatic pressure of water for support, and to offload joints by unweighting joint load by at least 50 % in navel deep water and by at least 75-80% in chest to neck deep water.  Viscosity of the water is needed for resistance of strengthening. Water current perturbations provides challenge to standing balance requiring increased core activation.                                                                                                                                    PATIENT EDUCATION:  Education details: reacquainting with aquatic therapy  Person educated: Patient Education method: Explanation, demo Education comprehension: verbalized understanding  HOME EXERCISE PROGRAM: TBA  ASSESSMENT:  CLINICAL IMPRESSION:  Pt requires multiple rest periods throughout session, in squatted position. She reports relief to 5/10 when in squatted position submerged 80%. improved confidence when walking next to pool wall. Pt able to side step up 1 step with LLE today with UE on wall. May assess ability to side step up/down 1-2 steps leading with LLE going up.  Used lift in and out for safety and improved toleration to session today.  Pt reported some relief of pain when engaging core with exercise in water.  Goals are ongoing. Pt requires WC transport to lobby from pool afterwards due to fatigue.     Initial Impression Patient is a 63 y.o. f who was seen today for physical therapy evaluation and treatment for failed Back Syndrome of Lumbar spine. She presents by herself using transportation services to get here.  She does walk back to setting but with difficulty needing to rest at least x 2 pushing rollator.  She reports significant pain for past several years following an  unsuccessful lumbar surgery.  She is highly pain sensitive with all movement as well as exhibiting symptoms of hyperesthesia throughout rle.  She was limited due to pain with assessment today.  She is a good candidate for skilled PT with concentration initially in aquatics.  Will trial 1 x a week to build tolerance to level of activity required to participate (transportation, distance to and from setting, changing clothes and exercise) then increase to 2 x week.  She is extremely limited in all functional mobility and ADL's due to deficits. She is a good candidate for aquatic intervention and will benefit from the properties of water to progress towards functional goals.  She is agreement with POC. Will need lift for entrance/exit to pool  OBJECTIVE IMPAIRMENTS: Abnormal gait, decreased activity tolerance, decreased balance, decreased endurance, decreased mobility, difficulty walking, decreased ROM, decreased strength, increased muscle spasms, impaired sensation, postural dysfunction, obesity, and pain.   ACTIVITY LIMITATIONS: carrying, lifting, bending, sitting, standing, squatting, sleeping, stairs, transfers, bed mobility, bathing, dressing, hygiene/grooming, locomotion level, and caring for others  PARTICIPATION LIMITATIONS: meal prep, cleaning, laundry, driving, shopping, community activity, occupation, and yard work  PERSONAL FACTORS: Fitness, Past/current experiences, Time since onset of injury/illness/exacerbation, and 3+ comorbidities: See Pmhx are also affecting patient's functional outcome.   REHAB POTENTIAL: Fair due to co-morbidities  CLINICAL DECISION MAKING: Unstable/unpredictable  EVALUATION COMPLEXITY: High   GOALS: Goals reviewed with patient? Yes  SHORT TERM GOALS: Target date: 11/17/23  Pt will tolerate full aquatic sessions consistently without increase in pain and with improving function to demonstrate good toleration and effectiveness of intervention.  Baseline: Goal  status: IN PROGRESS - 11/18/23  2.  Pt will amb x 10 minutes continuously submerged to demonstrate improving endurance Baseline:  Goal status: INITIAL  3.  Pt will perform STS from pool bench x 10 without LOB Baseline:  Goal status: INITIAL   LONG TERM GOALS: Target date: 12/19/23  Pt to improve on ODI by  up to 10% % to demonstrate statistically significant Improvement in function. Baseline: 38/50=76%  Goal status: INITIAL  2.  Pt will report decreased frequency of waking at night by 50% Baseline:  Goal status: INITIAL  3.  Pt will tolerate walking to and from setting and engaging in aquatic therapy session without excessive fatigue or increase in pain to demonstrate improved toleration to activity. Baseline:  Goal status: INITIAL  4.  Pt will tolerate stair climbing into and exiting pool x 7 steps ascending and descending 6 steps without use of handrail Baseline:  Goal status: INITIAL  5.  Pt will report decrease in pain by at least 50% for improved toleration to activity/quality of life and to demonstrate improved management of pain. Baseline:  Goal status: INITIAL  6.  Pt will be indep with final HEP's (land and aquatic as appropriate) for continued management of condition Baseline:  Goal status: INITIAL  PLAN:  PT FREQUENCY: 1-2x/week  PT DURATION: 10 weeks  PLANNED INTERVENTIONS: 97164- PT Re-evaluation, 97750- Physical Performance Testing, 97110-Therapeutic exercises, 97530- Therapeutic activity, 97112- Neuromuscular re-education, 97535- Self Care, 02859- Manual therapy, [id number]- Gait training, [phone]- Orthotic Initial, [phone]- Aquatic Therapy, [phone]- Electrical stimulation (unattended), [phone]- Electrical stimulation (manual), [id number]- Ionotophoresis 4mg /ml Dexamethasone , 79439 (1-2 muscles), 20561 (3+ muscles)- Dry Needling, Patient/Family education, Balance training, Stair training, Taping, Joint mobilization, DME instructions, Cryotherapy, and Moist heat.  PLAN FOR NEXT  SESSION: aquatics: trial toleration; general strengthening and stretching; balance and gait training, pain management  Delon Aquas, PTA 12/25/23 2:54 PM [facility] [address] [place], [place], [address] Phone: [phone]   Fax:  [phone]

## 2023-12-29 ENCOUNTER — Ambulatory Visit: Payer: Medicare (Managed Care) | Admitting: Family Medicine

## 2023-12-30 ENCOUNTER — Encounter (HOSPITAL_BASED_OUTPATIENT_CLINIC_OR_DEPARTMENT_OTHER): Payer: Self-pay | Admitting: Physical Therapy

## 2023-12-30 ENCOUNTER — Ambulatory Visit (HOSPITAL_BASED_OUTPATIENT_CLINIC_OR_DEPARTMENT_OTHER): Payer: Medicare (Managed Care) | Admitting: Physical Therapy

## 2023-12-30 DIAGNOSIS — M6281 Muscle weakness (generalized): Secondary | ICD-10-CM

## 2023-12-30 DIAGNOSIS — M5459 Other low back pain: Secondary | ICD-10-CM | POA: Diagnosis not present

## 2023-12-30 DIAGNOSIS — R2689 Other abnormalities of gait and mobility: Secondary | ICD-10-CM

## 2023-12-30 DIAGNOSIS — Z7689 Persons encountering health services in other specified circumstances: Secondary | ICD-10-CM | POA: Diagnosis not present

## 2023-12-30 NOTE — Therapy (Signed)
 OUTPATIENT PHYSICAL THERAPY THORACOLUMBAR TREATMENT   Patient Name: Gabriela Guerrero MRN: [id number] DOB:16-Jan-1961, 63 y.o., female Today's Date: 12/30/2023  END OF SESSION:  PT End of Session - 12/30/23 0941     Visit Number 7    Number of Visits 20    Date for PT Re-Evaluation 02/13/24    Authorization Type medicare    PT Start Time 0930    PT Stop Time 1010    PT Time Calculation (min) 40 min    Activity Tolerance Patient tolerated treatment well    Behavior During Therapy [facility] for tasks assessed/performed           Past Medical History:  Diagnosis Date   Hypertension    Past Surgical History:  Procedure Laterality Date   LUMBAR FUSION     Patient Active Problem List   Diagnosis Date Noted   Migraine 09/24/2022   Decreased activities of daily living (ADL) 05/05/2022   Tongue lesion 03/25/2022   Right hip pain 01/02/2022   Chronic pain of right knee 01/02/2022   Breast pain, right 05/14/2021   Failed back syndrome of lumbar spine 08/15/2020   Persistent dyspnea after COVID-19 05/25/2020   Stage 3a chronic kidney disease (HCC) 07/30/2019   MDD (major depressive disorder) 07/12/2019   Moderate mitral regurgitation 06/24/2019   Insomnia 06/01/2018   S/P lumbar spinal fusion 09/01/2017   Uncontrolled type 2 diabetes mellitus with hyperglycemia, with long-term current use of insulin  (HCC) 01/29/2017   Hypertension associated with diabetes (HCC) 01/26/2017   Diabetic retinopathy (HCC) 04/13/2013   GERD (gastroesophageal reflux disease) 07/23/2010    PCP: Velma Ku DO  REFERRING PROVIDER: Curtis Debby PARAS, MD   REFERRING DIAG: M96.1 (ICD-10-CM) - Failed back syndrome of lumbar spine   Rationale for Evaluation and Treatment: Rehabilitation  THERAPY DIAG:  Other low back pain  Muscle weakness (generalized)  Other abnormalities of gait and mobility  ONSET DATE: Chronic  SUBJECTIVE:                                                                                                                                                                                            SUBJECTIVE STATEMENT: Pt reports she had more pain after last session for a few hours, but noticed she was able to do a little more around house this weekend.  Pt reports some relief of tingling/sensitivity in Rt thigh with tape application. Pt hasn't removed tape yet.     Initial Subjective Lumbar fusion 2019. No pain improvement.  Oa knee.Has had aquatic therapy in past. Had injections in past that did not work.  Pt reports being in  bed most of time as that is where I feel the best.  Had Home care earlier this year and have been doing the exercises they gave me using bands, exercises standing.  Hurts but feels good. Pain is constant and high.  PERTINENT HISTORY:  As per FI:Olfajm spondylosis  Failed back syndrome of lumbar spine:63 year old female with diabetes, morbid obesity, chronic low back pain, she is status post an L4-L5 fusion with persistent pain, she endorses she got no relief not even temporary. She does have a CT scan from 2 years ago that does show what appears to be a stable fusion, she has adjacent severe facet arthropathy, pain is better with flexion. . . . multilevel facet joint injections, likely right sided L3-L4 and right sided L5-S1.     PAIN:  Are you having pain? Yes: NPRS scale: current 8-9/10 Pain location: R LB with radiation into toes Pain description: sharp pain to the touch, ache Aggravating factors: car riding, walking, standing, transfers Relieving factors: meds; sitting or lying down with legs elevated  PRECAUTIONS: Fall  RED FLAGS: None   WEIGHT BEARING RESTRICTIONS: No  FALLS:  Has patient fallen in last 6 months? 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  DIAGNOSTIC FINDINGS:  None recent  PATIENT SURVEYS:  Modified Oswestry 38/50=76%   COGNITION: Overall cognitive status: Within functional limits for tasks assessed     SENSATION: Radicular pain into rle>Left to feet   POSTURE: decreased lumbar lordosis and flexed trunk   PALPATION: Moderate TTP Lumbar spine to hips Hyperesthesia throughout rle  LUMBAR ROM:   Limited by at least 75%   LOWER EXTREMITY MMT:    MMT Right eval Left eval  Hip flexion 2+P! 3P!  Hip extension    Hip abduction    Hip adduction    Hip internal rotation    Hip external rotation    Knee flexion 3 3  Knee extension 3 3  Ankle dorsiflexion    Ankle plantarflexion    Ankle inversion    Ankle eversion     (Blank rows = not tested)   FUNCTIONAL TESTS:  2 minute WT:84ft using rollator  GAIT: Distance walked: 300 ft to setting 2 standing rest periods Assistive device utilized: Walker - 4 wheeled Level of assistance: SBA Comments: antalgic, dragging heels, guarded posture, leaning on forearms  TREATMENT  [facility] Adult PT Treatment:                                                DATE: 12/30/23 Pt seen for aquatic therapy today.  Treatment took place in water 3.5-4.75 ft in depth at the [facility] pool. Temp of water was 91.  Pt entered/exited the pool via lift.  *seated on lift: hip add/abd; LAQ;  * UE on wall in 65ft6: hip ext to toe touch x 5 each; relaxed squat;  heel/toeraises x 8 *UE on wall- side stepping Rt to 4 ft;  relaxed squat; marching in place;  hip abdct to toe touch x 5 each;  hip ext to toe touch x 5 each * forward/ backward gait next to wall, UE on barbell x 5 ft -> across width of pool x 4 times with rest break after  each width * back near wall: row motion with UE on barbell * STS from bench in water with UE on barbell, perching buttocks on edge x 4 -> STS with feet on blue step with UE on barbell and CGA x 1 * Rt lateral step down/ Lt lateral step up x 1 from blue step in water, UE on wall * after dried off: applied reg Rock tape to Rt thigh to desensitize area. Pt instructed on safe tape removal technique and verbalized understanding.    Pt requires the buoyancy and hydrostatic pressure of water for support, and to offload joints by unweighting joint load by at least 50 % in navel deep water and by at least 75-80% in chest to neck deep water.  Viscosity of the water is needed for resistance of strengthening. Water current perturbations provides challenge to standing balance requiring increased core activation.                                                                                                                                    PATIENT EDUCATION:  Education details: reacquainting with aquatic therapy  Person educated: Patient Education method: Explanation, demo Education comprehension: verbalized understanding  HOME EXERCISE PROGRAM: TBA  ASSESSMENT:  CLINICAL IMPRESSION:  Pt requires multiple rest periods throughout session, in squatted position. She reports relief to 5/10 when in squatted position submerged 80%. Pt able to make it across width of pool 4x with short rest breaks after each width with distant supervision and encouragement.   Used lift in and out for safety and improved toleration to session today.  Goals are ongoing. Pt requires WC transport to lobby from pool afterwards due to fatigue.     Initial Impression Patient is a 63 y.o. f who was seen today for physical therapy evaluation and treatment for failed Back Syndrome of Lumbar spine. She presents by herself using transportation services to get here.  She does walk back to setting but with difficulty needing to rest  at least x 2 pushing rollator.  She reports significant pain for past several years following an unsuccessful lumbar surgery.  She is highly pain sensitive with all movement as well as exhibiting symptoms of hyperesthesia throughout rle.  She was limited due to pain with assessment today.  She is a good candidate for skilled PT with concentration initially in aquatics.  Will trial 1 x a week to build tolerance to level of activity required to participate (transportation, distance to and from setting, changing clothes and exercise) then increase to 2 x week.  She is extremely limited in all functional mobility and ADL's due to deficits. She is a good candidate for aquatic  intervention and will benefit from the properties of water to progress towards functional goals. She is agreement with POC. Will need lift for entrance/exit to pool  OBJECTIVE IMPAIRMENTS: Abnormal gait, decreased activity tolerance, decreased balance, decreased endurance, decreased mobility, difficulty walking, decreased ROM, decreased strength, increased muscle spasms, impaired sensation, postural dysfunction, obesity, and pain.   ACTIVITY LIMITATIONS: carrying, lifting, bending, sitting, standing, squatting, sleeping, stairs, transfers, bed mobility, bathing, dressing, hygiene/grooming, locomotion level, and caring for others  PARTICIPATION LIMITATIONS: meal prep, cleaning, laundry, driving, shopping, community activity, occupation, and yard work  PERSONAL FACTORS: Fitness, Past/current experiences, Time since onset of injury/illness/exacerbation, and 3+ comorbidities: See Pmhx are also affecting patient's functional outcome.   REHAB POTENTIAL: Fair due to co-morbidities  CLINICAL DECISION MAKING: Unstable/unpredictable  EVALUATION COMPLEXITY: High   GOALS: Goals reviewed with patient? Yes  SHORT TERM GOALS: Target date: 11/17/23  Pt will tolerate full aquatic sessions consistently without increase in pain and with improving  function to demonstrate good toleration and effectiveness of intervention.  Baseline: Goal status: IN PROGRESS - 11/18/23  2.  Pt will amb x 10 minutes continuously submerged to demonstrate improving endurance Baseline:  Goal status: INITIAL  3.  Pt will perform STS from pool bench x 10 without LOB Baseline:  Goal status: INITIAL   LONG TERM GOALS: Target date: 12/19/23  Pt to improve on ODI by  up to 10% % to demonstrate statistically significant Improvement in function. Baseline: 38/50=76%  Goal status: INITIAL  2.  Pt will report decreased frequency of waking at night by 50% Baseline:  Goal status: INITIAL  3.  Pt will tolerate walking to and from setting and engaging in aquatic therapy session without excessive fatigue or increase in pain to demonstrate improved toleration to activity. Baseline:  Goal status: INITIAL  4.  Pt will tolerate stair climbing into and exiting pool x 7 steps ascending and descending 6 steps without use of handrail Baseline:  Goal status: INITIAL  5.  Pt will report decrease in pain by at least 50% for improved toleration to activity/quality of life and to demonstrate improved management of pain. Baseline:  Goal status: INITIAL  6.  Pt will be indep with final HEP's (land and aquatic as appropriate) for continued management of condition Baseline:  Goal status: INITIAL  PLAN:  PT FREQUENCY: 1-2x/week  PT DURATION: 10 weeks  PLANNED INTERVENTIONS: 97164- PT Re-evaluation, 97750- Physical Performance Testing, 97110-Therapeutic exercises, 97530- Therapeutic activity, [id number]- Neuromuscular re-education, 97535- Self Care, 02859- Manual therapy, [id number]- Gait training, [phone]- Orthotic Initial, [phone]- Aquatic Therapy, [phone]- Electrical stimulation (unattended), [phone]- Electrical stimulation (manual), [id number]- Ionotophoresis 4mg /ml Dexamethasone , 79439 (1-2 muscles), 20561 (3+ muscles)- Dry Needling, Patient/Family education, Balance training, Stair  training, Taping, Joint mobilization, DME instructions, Cryotherapy, and Moist heat.  PLAN FOR NEXT SESSION: aquatics: trial toleration; general strengthening and stretching; balance and gait training, pain management  Delon Aquas, PTA 12/30/23 12:07 PM [facility] [address] [place], [place], [address] Phone: [phone]   Fax:  [phone]

## 2024-01-01 ENCOUNTER — Ambulatory Visit (HOSPITAL_BASED_OUTPATIENT_CLINIC_OR_DEPARTMENT_OTHER): Payer: Medicare (Managed Care) | Admitting: Physical Therapy

## 2024-01-01 DIAGNOSIS — M5459 Other low back pain: Secondary | ICD-10-CM | POA: Diagnosis not present

## 2024-01-01 DIAGNOSIS — Z7689 Persons encountering health services in other specified circumstances: Secondary | ICD-10-CM | POA: Diagnosis not present

## 2024-01-01 DIAGNOSIS — R2689 Other abnormalities of gait and mobility: Secondary | ICD-10-CM

## 2024-01-01 DIAGNOSIS — M6281 Muscle weakness (generalized): Secondary | ICD-10-CM

## 2024-01-01 NOTE — Therapy (Signed)
 OUTPATIENT PHYSICAL THERAPY THORACOLUMBAR TREATMENT   Patient Name: Gabriela Guerrero MRN: [id number] DOB:1961-04-02, 63 y.o., female Today's Date: 01/01/2024  END OF SESSION:  PT End of Session - 01/01/24 1012     Visit Number 8    Number of Visits 20    Date for PT Re-Evaluation 02/13/24    Authorization Type medicare    PT Start Time 0925    PT Stop Time 1009    PT Time Calculation (min) 44 min    Activity Tolerance Patient tolerated treatment well    Behavior During Therapy [facility] for tasks assessed/performed            Past Medical History:  Diagnosis Date   Hypertension    Past Surgical History:  Procedure Laterality Date   LUMBAR FUSION     Patient Active Problem List   Diagnosis Date Noted   Migraine 09/24/2022   Decreased activities of daily living (ADL) 05/05/2022   Tongue lesion 03/25/2022   Right hip pain 01/02/2022   Chronic pain of right knee 01/02/2022   Breast pain, right 05/14/2021   Failed back syndrome of lumbar spine 08/15/2020   Persistent dyspnea after COVID-19 05/25/2020   Stage 3a chronic kidney disease (HCC) 07/30/2019   MDD (major depressive disorder) 07/12/2019   Moderate mitral regurgitation 06/24/2019   Insomnia 06/01/2018   S/P lumbar spinal fusion 09/01/2017   Uncontrolled type 2 diabetes mellitus with hyperglycemia, with long-term current use of insulin  (HCC) 01/29/2017   Hypertension associated with diabetes (HCC) 01/26/2017   Diabetic retinopathy (HCC) 04/13/2013   GERD (gastroesophageal reflux disease) 07/23/2010    PCP: Velma Ku DO  REFERRING PROVIDER: Curtis Debby PARAS, MD   REFERRING DIAG: M96.1 (ICD-10-CM) - Failed back syndrome of lumbar spine   Rationale for Evaluation and Treatment: Rehabilitation  THERAPY DIAG:  Other low back pain  Muscle weakness (generalized)  Other abnormalities of gait and mobility  ONSET DATE: Chronic  SUBJECTIVE:                                                                                                                                                                                            SUBJECTIVE STATEMENT: Pt reports she can notice that she is standing a little taller at home.  Tape on thigh has helped reduced the funny bone sensation.       Initial Subjective Lumbar fusion 2019. No pain improvement.  Oa knee.Has had aquatic therapy in past. Had injections in past that did not work.  Pt reports being in bed most of time as that is where I feel the best.  Had Home care earlier  this year and have been doing the exercises they gave me using bands, exercises standing.  Hurts but feels good. Pain is constant and high.  PERTINENT HISTORY:  As per FI:Olfajm spondylosis  Failed back syndrome of lumbar spine:63 year old female with diabetes, morbid obesity, chronic low back pain, she is status post an L4-L5 fusion with persistent pain, she endorses she got no relief not even temporary. She does have a CT scan from 2 years ago that does show what appears to be a stable fusion, she has adjacent severe facet arthropathy, pain is better with flexion. . . . multilevel facet joint injections, likely right sided L3-L4 and right sided L5-S1.     PAIN:  Are you having pain? Yes: NPRS scale: current 8-9/10 Pain location: R LB with radiation into toes Pain description: sharp pain to the touch, ache Aggravating factors: car riding, walking, standing, transfers Relieving factors: meds; sitting or lying down with legs elevated  PRECAUTIONS: Fall  RED FLAGS: None   WEIGHT BEARING RESTRICTIONS: No  FALLS:  Has patient fallen in last 6 months? 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  DIAGNOSTIC FINDINGS:  None recent  PATIENT SURVEYS:  Modified Oswestry 38/50=76%   COGNITION: Overall cognitive status: Within functional limits for tasks assessed     SENSATION: Radicular pain into rle>Left to feet   POSTURE: decreased lumbar lordosis and flexed trunk   PALPATION: Moderate TTP Lumbar spine to hips Hyperesthesia throughout rle  LUMBAR ROM:   Limited by at least 75%   LOWER EXTREMITY MMT:    MMT Right eval Left eval  Hip flexion 2+P! 3P!  Hip extension    Hip abduction    Hip adduction    Hip internal rotation    Hip external rotation    Knee flexion 3 3  Knee extension 3 3  Ankle dorsiflexion    Ankle plantarflexion    Ankle inversion    Ankle eversion     (Blank rows = not tested)   FUNCTIONAL TESTS:  2 minute WT:23ft using rollator  GAIT: Distance walked: 300 ft to setting 2 standing rest periods Assistive device utilized: Walker - 4 wheeled Level of assistance: SBA Comments: antalgic, dragging heels, guarded posture, leaning on forearms  TREATMENT  [facility] Adult PT Treatment:                                                DATE: 01/01/24 Pt seen for aquatic therapy today.  Treatment took place in water 3.5-4.75 ft in depth at the [facility] pool. Temp of water was 91.  Pt entered/exited the pool via lift.  *UE on wall- side stepping Rt to 4 ft;  relaxed squat; marching in place;  hip abdct to toe touch x 5 each;  hip ext to toe touch x 5 each * forward/ backward gait UE on barbell across width of pool x 4 times with rest break after each width * side stepping with UE on  barbell x 1 width each direction * Lt forward step up x 2,  in 4 ft of water, Rt retro step down with UE on wall * Lt lateral step up in x 3 - heavy cues for sequence * STS from bench in water, feet on blue step, with UE on  barbell x 8 (therapist with intermittent UE on barbell to steady  Pt requires the buoyancy and hydrostatic pressure of water for support, and to offload joints by unweighting joint load by at least 50 % in navel deep water and by at least 75-80% in chest to neck deep water.  Viscosity of the water is needed for resistance of strengthening. Water current perturbations provides challenge to standing balance requiring increased core activation.                                                                                                                                    PATIENT EDUCATION:  Education details: reacquainting with aquatic therapy  Person educated: Patient Education method: Explanation, demo Education comprehension: verbalized understanding  HOME EXERCISE PROGRAM: TBA  ASSESSMENT:  CLINICAL IMPRESSION:  Pt requires multiple rest periods throughout session, in squatted position. Pt able to ascend 5 step in 40ft of water with heavy cues and increased time.  STS from bench in water is improving.   Used lift in and out for safety and improved toleration to session today.  Goals are ongoing. Pt walked to/from lobby with rollator instead of using WC transport to lobby from pool.  Pt remains motivated to progress.     Initial Impression Patient is a 63 y.o. f who was seen today for physical therapy evaluation and treatment for failed Back Syndrome of Lumbar spine. She presents by herself using transportation services to get here.  She does walk back to setting but with difficulty needing to rest at least x 2 pushing rollator.  She reports significant pain for past several years following an unsuccessful lumbar surgery.  She is highly pain sensitive with all movement as well as exhibiting symptoms of hyperesthesia throughout rle.  She was limited due to pain with assessment today.  She is a good candidate for skilled PT with concentration initially in aquatics.  Will trial 1 x a week  to build tolerance to level of activity required to participate (transportation, distance to and from setting, changing clothes and exercise) then increase to 2 x week.  She is extremely limited in all functional mobility and ADL's due to deficits. She is a good candidate for aquatic intervention and will benefit from the properties of water to progress towards functional goals. She is agreement with POC. Will need lift for entrance/exit to pool  OBJECTIVE IMPAIRMENTS: Abnormal gait, decreased activity tolerance, decreased balance, decreased endurance, decreased mobility, difficulty walking, decreased ROM, decreased strength, increased muscle spasms, impaired sensation, postural dysfunction, obesity, and pain.   ACTIVITY LIMITATIONS: carrying, lifting, bending, sitting, standing, squatting,  sleeping, stairs, transfers, bed mobility, bathing, dressing, hygiene/grooming, locomotion level, and caring for others  PARTICIPATION LIMITATIONS: meal prep, cleaning, laundry, driving, shopping, community activity, occupation, and yard work  PERSONAL FACTORS: Fitness, Past/current experiences, Time since onset of injury/illness/exacerbation, and 3+ comorbidities: See Pmhx are also affecting patient's functional outcome.   REHAB POTENTIAL: Fair due to co-morbidities  CLINICAL DECISION MAKING: Unstable/unpredictable  EVALUATION COMPLEXITY: High   GOALS: Goals reviewed with patient? Yes  SHORT TERM GOALS: Target date: 11/17/23  Pt will tolerate full aquatic sessions consistently without increase in pain and with improving function to demonstrate good toleration and effectiveness of intervention.  Baseline: Goal status: IN PROGRESS - 11/18/23  2.  Pt will amb x 10 minutes continuously submerged to demonstrate improving endurance Baseline:  Goal status: INITIAL  3.  Pt will perform STS from pool bench x 10 without LOB Baseline:  Goal status:IN progress - 01/01/24   LONG TERM GOALS: Target date:  12/19/23  Pt to improve on ODI by  up to 10% % to demonstrate statistically significant Improvement in function. Baseline: 38/50=76%  Goal status: INITIAL  2.  Pt will report decreased frequency of waking at night by 50% Baseline:  Goal status: INITIAL  3.  Pt will tolerate walking to and from setting and engaging in aquatic therapy session without excessive fatigue or increase in pain to demonstrate improved toleration to activity. Baseline:  Goal status: INITIAL  4.  Pt will tolerate stair climbing into and exiting pool x 7 steps ascending and descending 6 steps without use of handrail Baseline:  Goal status: INITIAL  5.  Pt will report decrease in pain by at least 50% for improved toleration to activity/quality of life and to demonstrate improved management of pain. Baseline:  Goal status: INITIAL  6.  Pt will be indep with final HEP's (land and aquatic as appropriate) for continued management of condition Baseline:  Goal status: INITIAL  PLAN:  PT FREQUENCY: 1-2x/week  PT DURATION: 10 weeks  PLANNED INTERVENTIONS: 97164- PT Re-evaluation, 97750- Physical Performance Testing, 97110-Therapeutic exercises, 97530- Therapeutic activity, [id number]- Neuromuscular re-education, 97535- Self Care, 02859- Manual therapy, [id number]- Gait training, [phone]- Orthotic Initial, [phone]- Aquatic Therapy, [phone]- Electrical stimulation (unattended), [phone]- Electrical stimulation (manual), [id number]- Ionotophoresis 4mg /ml Dexamethasone , 79439 (1-2 muscles), 20561 (3+ muscles)- Dry Needling, Patient/Family education, Balance training, Stair training, Taping, Joint mobilization, DME instructions, Cryotherapy, and Moist heat.  PLAN FOR NEXT SESSION: aquatics: trial toleration; general strengthening and stretching; balance and gait training, pain management  Delon Aquas, PTA 01/01/24 3:19 PM [facility] [address] [place], [place], [address] Phone:  [phone]   Fax:  [phone]

## 2024-01-03 ENCOUNTER — Other Ambulatory Visit: Payer: Self-pay | Admitting: Family Medicine

## 2024-01-06 ENCOUNTER — Ambulatory Visit (HOSPITAL_BASED_OUTPATIENT_CLINIC_OR_DEPARTMENT_OTHER): Payer: Medicare (Managed Care) | Admitting: Physical Therapy

## 2024-01-06 ENCOUNTER — Encounter (HOSPITAL_BASED_OUTPATIENT_CLINIC_OR_DEPARTMENT_OTHER): Payer: Self-pay | Admitting: Physical Therapy

## 2024-01-06 DIAGNOSIS — M5459 Other low back pain: Secondary | ICD-10-CM

## 2024-01-06 DIAGNOSIS — Z7689 Persons encountering health services in other specified circumstances: Secondary | ICD-10-CM | POA: Diagnosis not present

## 2024-01-06 DIAGNOSIS — R2689 Other abnormalities of gait and mobility: Secondary | ICD-10-CM

## 2024-01-06 DIAGNOSIS — M6281 Muscle weakness (generalized): Secondary | ICD-10-CM

## 2024-01-06 NOTE — Therapy (Signed)
 OUTPATIENT PHYSICAL THERAPY THORACOLUMBAR TREATMENT   Patient Name: Gabriela Guerrero MRN: [id number] DOB:[birth year], 63 y.o., female Today's Date: 01/06/2024  END OF SESSION:  PT End of Session - 01/06/24 0933     Visit Number 9    Number of Visits 20    Date for PT Re-Evaluation 02/13/24    Authorization Type medicare    Progress Note Due on Visit 15    PT Start Time 0908    PT Stop Time 0955    PT Time Calculation (min) 47 min    Activity Tolerance Patient tolerated treatment well    Behavior During Therapy [facility] for tasks assessed/performed             Past Medical History:  Diagnosis Date   Hypertension    Past Surgical History:  Procedure Laterality Date   LUMBAR FUSION     Patient Active Problem List   Diagnosis Date Noted   Migraine 09/24/2022   Decreased activities of daily living (ADL) 05/05/2022   Tongue lesion 03/25/2022   Right hip pain 01/02/2022   Chronic pain of right knee 01/02/2022   Breast pain, right 05/14/2021   Failed back syndrome of lumbar spine 08/15/2020   Persistent dyspnea after COVID-19 05/25/2020   Stage 3a chronic kidney disease (HCC) 07/30/2019   MDD (major depressive disorder) 07/12/2019   Moderate mitral regurgitation 06/24/2019   Insomnia 06/01/2018   S/P lumbar spinal fusion 09/01/2017   Uncontrolled type 2 diabetes mellitus with hyperglycemia, with long-term current use of insulin  (HCC) 01/29/2017   Hypertension associated with diabetes (HCC) 01/26/2017   Diabetic retinopathy (HCC) 04/13/2013   GERD (gastroesophageal reflux disease) 07/23/2010    PCP: Velma Ku DO  REFERRING PROVIDER: Curtis Debby PARAS, MD   REFERRING DIAG: M96.1 (ICD-10-CM) - Failed back syndrome of lumbar spine   Rationale for Evaluation and Treatment: Rehabilitation  THERAPY DIAG:  Other low back pain  Muscle weakness (generalized)  Other abnormalities of gait and mobility  ONSET DATE: Chronic  SUBJECTIVE:                                                                                                                                                                                            SUBJECTIVE STATEMENT: Pt reports she rested one time walking from lobby to pool.         Initial Subjective Lumbar fusion 2019. No pain improvement.  Oa knee.Has had aquatic therapy in past. Had injections in past that did not work.  Pt reports being in bed most of time as that is where I feel the best.  Had Home care earlier this year  and have been doing the exercises they gave me using bands, exercises standing.  Hurts but feels good. Pain is constant and high.  PERTINENT HISTORY:  As per FI:Olfajm spondylosis  Failed back syndrome of lumbar spine:63 year old female with diabetes, morbid obesity, chronic low back pain, she is status post an L4-L5 fusion with persistent pain, she endorses she got no relief not even temporary. She does have a CT scan from 2 years ago that does show what appears to be a stable fusion, she has adjacent severe facet arthropathy, pain is better with flexion. . . . multilevel facet joint injections, likely right sided L3-L4 and right sided L5-S1.     PAIN:  Are you having pain? Yes: NPRS scale: current 8-9/10 Pain location: R LB with radiation into toes Pain description: sharp pain to the touch, ache Aggravating factors: car riding, walking, standing, transfers Relieving factors: meds; sitting or lying down with legs elevated  PRECAUTIONS: Fall  RED FLAGS: None   WEIGHT BEARING RESTRICTIONS: No  FALLS:  Has patient fallen in last 6 months? 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  DIAGNOSTIC FINDINGS:  None recent  PATIENT SURVEYS:  Modified Oswestry 38/50=76%   COGNITION: Overall cognitive status: Within functional limits for tasks assessed     SENSATION: Radicular pain into rle>Left to feet   POSTURE: decreased lumbar lordosis and flexed trunk   PALPATION: Moderate TTP Lumbar spine to hips Hyperesthesia throughout rle  LUMBAR ROM:   Limited by at least 75%   LOWER EXTREMITY MMT:    MMT Right eval Left eval  Hip flexion 2+P! 3P!  Hip extension    Hip abduction    Hip adduction    Hip internal rotation    Hip external rotation    Knee flexion 3 3  Knee extension 3 3  Ankle dorsiflexion    Ankle plantarflexion    Ankle inversion    Ankle eversion     (Blank rows = not tested)   FUNCTIONAL TESTS:  2 minute WT:71ft using rollator  GAIT: Distance walked: 300 ft to setting 2 standing rest periods Assistive device utilized: Walker - 4 wheeled Level of assistance: SBA Comments: antalgic, dragging heels, guarded posture, leaning on forearms  TREATMENT  [facility] Adult PT Treatment:                                                DATE: 01/06/24 Pt seen for aquatic therapy today.  Treatment took place in water 3.5-4.75 ft in depth at the [facility] pool. Temp of water was 91.  Pt entered pool via stairs, backward in step-to pattern with heavy UE on rails/exited the pool via lift.  * forward gait UE on barbell across width of pool x 4 times with (rest break after each width) * backward gait UE on barbell x 3 width (rest break after each width) * UE On barbell next to wall, sidestepping R/L 4 ft x 3, rest break *  STS from bench in water, feet on ground, UE on barbell x 8-> with feet on blue step, with UE on barbell x 6 (therapist intermittent UE to barbell to steady-> supervision) * Lt lateral step up with BUE on rail x 2 * after dried  off: applied reg Rock tape to Rt thigh to desensitize area. Pt instructed on safe tape removal technique and verbalized understanding.   Pt requires the buoyancy and hydrostatic pressure of water for support, and to offload joints by unweighting joint load by at least 50 % in navel deep water and by at least 75-80% in chest to neck deep water.  Viscosity of the water is needed for resistance of strengthening. Water current perturbations provides challenge to standing balance requiring increased core activation.                                                                                                                                    PATIENT EDUCATION:  Education details: reacquainting with aquatic therapy  Person educated: Patient Education method: Explanation, demo Education comprehension: verbalized understanding  HOME EXERCISE PROGRAM: TBA  ASSESSMENT:  CLINICAL IMPRESSION:  Pt requires multiple rest periods throughout session, in squatted position. Pt able to descend 7 steps into pool today with cues and increased time.  STS from bench in water is improving, able to complete with supervision.   Used lift out for safety at end of session today.  Pt remains motivated to progress and is gradually progressing towards goals.     Initial Impression Patient is a 63 y.o. f who was seen today for physical therapy evaluation and treatment for failed Back Syndrome of Lumbar spine. She presents by herself using transportation services to get here.  She does walk back to setting but with difficulty needing to rest at least x 2 pushing rollator.  She reports significant pain for past several years following an unsuccessful lumbar surgery.  She is highly pain sensitive with all movement as well as exhibiting symptoms of hyperesthesia throughout rle.  She was limited due to pain with assessment today.  She is a good candidate for skilled PT with concentration initially in aquatics.  Will trial  1 x a week to build tolerance to level of activity required to participate (transportation, distance to and from setting, changing clothes and exercise) then increase to 2 x week.  She is extremely limited in all functional mobility and ADL's due to deficits. She is a good candidate for aquatic intervention and will benefit from the properties of water to progress towards functional goals. She is agreement with POC. Will need lift for entrance/exit to pool  OBJECTIVE IMPAIRMENTS: Abnormal gait, decreased activity tolerance, decreased balance, decreased endurance, decreased mobility, difficulty walking, decreased ROM, decreased strength, increased muscle spasms, impaired sensation, postural dysfunction, obesity, and pain.   ACTIVITY LIMITATIONS: carrying, lifting, bending, sitting, standing, squatting, sleeping, stairs, transfers, bed mobility, bathing, dressing, hygiene/grooming, locomotion  level, and caring for others  PARTICIPATION LIMITATIONS: meal prep, cleaning, laundry, driving, shopping, community activity, occupation, and yard work  PERSONAL FACTORS: Fitness, Past/current experiences, Time since onset of injury/illness/exacerbation, and 3+ comorbidities: See Pmhx are also affecting patient's functional outcome.   REHAB POTENTIAL: Fair due to co-morbidities  CLINICAL DECISION MAKING: Unstable/unpredictable  EVALUATION COMPLEXITY: High   GOALS: Goals reviewed with patient? Yes  SHORT TERM GOALS: Target date: 11/17/23  Pt will tolerate full aquatic sessions consistently without increase in pain and with improving function to demonstrate good toleration and effectiveness of intervention.  Baseline: Goal status: MET- 01/06/24  2.  Pt will amb x 10 minutes continuously submerged to demonstrate improving endurance Baseline:  Goal status: IN PROGRESS- 01/06/24  3.  Pt will perform STS from pool bench x 10 without LOB Baseline: (with feet on ground)  Goal status:MET -01/06/24   LONG  TERM GOALS: Target date: POC date  Pt to improve on ODI by  up to 10% % to demonstrate statistically significant Improvement in function. Baseline: 38/50=76%  Goal status: INITIAL  2.  Pt will report decreased frequency of waking at night by 50% Baseline:  Goal status: INITIAL  3.  Pt will tolerate walking to and from setting and engaging in aquatic therapy session without excessive fatigue or increase in pain to demonstrate improved toleration to activity. Baseline:  Goal status: IN PROGRESS - 01/06/24  4.  Pt will tolerate stair climbing into and exiting pool x 7 steps ascending and descending 6 steps without use of handrail Baseline: entered pool via stairs this date Goal status:  IN PROGRESS - 01/06/24  5.  Pt will report decrease in pain by at least 50% for improved toleration to activity/quality of life and to demonstrate improved management of pain. Baseline:  Goal status: INITIAL  6.  Pt will be indep with final HEP's (land and aquatic as appropriate) for continued management of condition Baseline:  Goal status: INITIAL  PLAN:  PT FREQUENCY: 1-2x/week  PT DURATION: 10 weeks  PLANNED INTERVENTIONS: 97164- PT Re-evaluation, 97750- Physical Performance Testing, 97110-Therapeutic exercises, 97530- Therapeutic activity, 97112- Neuromuscular re-education, 97535- Self Care, 02859- Manual therapy, [id number]- Gait training, [phone]- Orthotic Initial, [phone]- Aquatic Therapy, [phone]- Electrical stimulation (unattended), [phone]- Electrical stimulation (manual), [id number]- Ionotophoresis 4mg /ml Dexamethasone , 79439 (1-2 muscles), 20561 (3+ muscles)- Dry Needling, Patient/Family education, Balance training, Stair training, Taping, Joint mobilization, DME instructions, Cryotherapy, and Moist heat.  PLAN FOR NEXT SESSION: aquatics: trial toleration; general strengthening and stretching; balance and gait training, pain management  Delon Aquas, PTA 01/06/24 10:06 AM [facility]  [facility] [address] [place], [place], [address] Phone: [phone]   Fax:  [phone]

## 2024-01-07 ENCOUNTER — Ambulatory Visit: Payer: Medicare (Managed Care) | Admitting: Family Medicine

## 2024-01-08 ENCOUNTER — Ambulatory Visit (HOSPITAL_BASED_OUTPATIENT_CLINIC_OR_DEPARTMENT_OTHER): Payer: Medicare (Managed Care) | Admitting: Physical Therapy

## 2024-01-12 ENCOUNTER — Other Ambulatory Visit: Payer: Medicare (Managed Care)

## 2024-01-12 NOTE — Progress Notes (Unsigned)
   01/12/2024  Patient ID: Gabriela Guerrero, female   DOB: 03-20-1961, 63 y.o.   MRN: [id number]  Outreach attempt for scheduled telephone follow-up visit was not successful, but I was able to leave a HIPAA compliant voicemail with my direct phone number.  I am also sending the patient a MyChart message to see if we can reschedule the visit and forwarding my last note to Dr. Alvia since patient is scheduled for a follow-up with him Thursday.  Channing DELENA Mealing, PharmD, DPLA

## 2024-01-14 ENCOUNTER — Encounter: Payer: Self-pay | Admitting: Family Medicine

## 2024-01-14 ENCOUNTER — Ambulatory Visit: Payer: Medicare (Managed Care) | Admitting: Family Medicine

## 2024-01-14 VITALS — BP 166/76 | HR 66 | Ht 61.0 in | Wt 256.0 lb

## 2024-01-14 DIAGNOSIS — Z794 Long term (current) use of insulin: Secondary | ICD-10-CM

## 2024-01-14 DIAGNOSIS — G43819 Other migraine, intractable, without status migrainosus: Secondary | ICD-10-CM | POA: Diagnosis not present

## 2024-01-14 DIAGNOSIS — Z789 Other specified health status: Secondary | ICD-10-CM | POA: Diagnosis not present

## 2024-01-14 DIAGNOSIS — F3341 Major depressive disorder, recurrent, in partial remission: Secondary | ICD-10-CM | POA: Diagnosis not present

## 2024-01-14 DIAGNOSIS — N1831 Chronic kidney disease, stage 3a: Secondary | ICD-10-CM | POA: Diagnosis not present

## 2024-01-14 DIAGNOSIS — E1122 Type 2 diabetes mellitus with diabetic chronic kidney disease: Secondary | ICD-10-CM

## 2024-01-14 DIAGNOSIS — E1159 Type 2 diabetes mellitus with other circulatory complications: Secondary | ICD-10-CM

## 2024-01-14 DIAGNOSIS — I152 Hypertension secondary to endocrine disorders: Secondary | ICD-10-CM

## 2024-01-14 DIAGNOSIS — I129 Hypertensive chronic kidney disease with stage 1 through stage 4 chronic kidney disease, or unspecified chronic kidney disease: Secondary | ICD-10-CM

## 2024-01-14 MED ORDER — MOUNJARO 5 MG/0.5ML ~~LOC~~ SOAJ: 5.0000 mg | SUBCUTANEOUS | 0 refills | Status: DC

## 2024-01-14 MED ORDER — BUTALBITAL-APAP-CAFFEINE 50-325-40 MG PO TABS: 1.0000 | ORAL_TABLET | Freq: Four times a day (QID) | ORAL | 0 refills | Status: DC | PRN

## 2024-01-14 MED ORDER — NOVOLOG FLEXPEN 100 UNIT/ML ~~LOC~~ SOPN: 14.0000 [IU] | PEN_INJECTOR | Freq: Three times a day (TID) | SUBCUTANEOUS | 2 refills | Status: DC

## 2024-01-14 MED ORDER — TOUJEO MAX SOLOSTAR 300 UNIT/ML ~~LOC~~ SOPN: 68.0000 [IU] | PEN_INJECTOR | Freq: Every day | SUBCUTANEOUS | 1 refills | Status: DC

## 2024-01-14 NOTE — Patient Instructions (Signed)
 Continue mounjaro  2.5mg /week for the next month then increase to 5mg /week  Increase novolog  to 14 units with each meal.   Increase toujeo  to 68 units

## 2024-01-15 ENCOUNTER — Ambulatory Visit (HOSPITAL_BASED_OUTPATIENT_CLINIC_OR_DEPARTMENT_OTHER): Payer: Medicare (Managed Care) | Admitting: Physical Therapy

## 2024-01-15 ENCOUNTER — Encounter (HOSPITAL_BASED_OUTPATIENT_CLINIC_OR_DEPARTMENT_OTHER): Payer: Self-pay | Admitting: Physical Therapy

## 2024-01-15 DIAGNOSIS — M6281 Muscle weakness (generalized): Secondary | ICD-10-CM

## 2024-01-15 DIAGNOSIS — R2689 Other abnormalities of gait and mobility: Secondary | ICD-10-CM

## 2024-01-15 DIAGNOSIS — M5459 Other low back pain: Secondary | ICD-10-CM

## 2024-01-15 NOTE — Therapy (Signed)
 OUTPATIENT PHYSICAL THERAPY THORACOLUMBAR TREATMENT   Patient Name: Gabriela Guerrero MRN: [id number] DOB:26-Nov-1960, 63 y.o., female Today's Date: 01/15/2024  END OF SESSION:  PT End of Session - 01/15/24 1148     Visit Number 10    Number of Visits 20    Date for Recertification  02/13/24    Authorization Type medicare    Progress Note Due on Visit 15    PT Start Time 1147    PT Stop Time 1230    PT Time Calculation (min) 43 min    Activity Tolerance Patient tolerated treatment well    Behavior During Therapy [facility] for tasks assessed/performed             Past Medical History:  Diagnosis Date   Hypertension    Past Surgical History:  Procedure Laterality Date   LUMBAR FUSION     Patient Active Problem List   Diagnosis Date Noted   Migraine 09/24/2022   Decreased activities of daily living (ADL) 05/05/2022   Tongue lesion 03/25/2022   Right hip pain 01/02/2022   Chronic pain of right knee 01/02/2022   Breast pain, right 05/14/2021   Failed back syndrome of lumbar spine 08/15/2020   Persistent dyspnea after COVID-19 05/25/2020   Stage 3a chronic kidney disease (HCC) 07/30/2019   MDD (major depressive disorder) 07/12/2019   Moderate mitral regurgitation 06/24/2019   Insomnia 06/01/2018   S/P lumbar spinal fusion 09/01/2017   Uncontrolled type 2 diabetes mellitus with hyperglycemia, with long-term current use of insulin  (HCC) 01/29/2017   Hypertension associated with diabetes (HCC) 01/26/2017   Diabetic retinopathy (HCC) 04/13/2013   GERD (gastroesophageal reflux disease) 07/23/2010    PCP: Velma Ku DO  REFERRING PROVIDER: Curtis Debby PARAS, MD   REFERRING DIAG: M96.1 (ICD-10-CM) - Failed back syndrome of lumbar spine   Rationale for Evaluation and Treatment: Rehabilitation  THERAPY DIAG:  Other low back pain  Muscle weakness (generalized)  Other abnormalities of gait and mobility  ONSET DATE: Chronic  SUBJECTIVE:                                                                                                                                                                                            SUBJECTIVE STATEMENT: Good response to last session. Pain today 8/10 .    Initial Subjective Lumbar fusion 2019. No pain improvement.  Oa knee.Has had aquatic therapy in past. Had injections in past that did not work.  Pt reports being in bed most of time as that is where I feel the best.  Had Home care earlier this year and have been doing the exercises they  gave me using bands, exercises standing.  Hurts but feels good. Pain is constant and high.  PERTINENT HISTORY:  As per FI:Olfajm spondylosis  Failed back syndrome of lumbar spine:63 year old female with diabetes, morbid obesity, chronic low back pain, she is status post an L4-L5 fusion with persistent pain, she endorses she got no relief not even temporary. She does have a CT scan from 2 years ago that does show what appears to be a stable fusion, she has adjacent severe facet arthropathy, pain is better with flexion. . . . multilevel facet joint injections, likely right sided L3-L4 and right sided L5-S1.     PAIN:  Are you having pain? Yes: NPRS scale: current 8-9/10 Pain location: R LB with radiation into toes Pain description: sharp pain to the touch, ache Aggravating factors: car riding, walking, standing, transfers Relieving factors: meds; sitting or lying down with legs elevated  PRECAUTIONS: Fall  RED FLAGS: None   WEIGHT BEARING RESTRICTIONS: No  FALLS:  Has patient fallen in last 6 months? 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  DIAGNOSTIC FINDINGS:  None recent  PATIENT SURVEYS:  Modified Oswestry 38/50=76%   COGNITION: Overall cognitive status: Within functional limits for tasks assessed     SENSATION: Radicular pain into rle>Left to feet   POSTURE: decreased lumbar lordosis and flexed trunk   PALPATION: Moderate TTP Lumbar spine to hips Hyperesthesia throughout rle  LUMBAR ROM:   Limited by at least 75%   LOWER EXTREMITY MMT:    MMT Right eval Left eval  Hip flexion 2+P! 3P!  Hip extension    Hip abduction    Hip adduction    Hip internal rotation    Hip external rotation    Knee flexion 3 3  Knee extension 3 3  Ankle dorsiflexion    Ankle plantarflexion    Ankle inversion    Ankle eversion     (Blank rows = not tested)   FUNCTIONAL TESTS:  2 minute WT:39ft using rollator  GAIT: Distance walked: 300 ft to setting 2 standing rest periods Assistive device utilized: Walker - 4 wheeled Level of assistance: SBA Comments: antalgic, dragging heels, guarded posture, leaning on forearms  TREATMENT  [facility] Adult PT Treatment:                                                DATE: 01/15/24 Pt seen for aquatic therapy today.  Treatment took place in water 3.5-4.75 ft in depth at the [facility] pool. Temp of water was 91.  Pt entered pool via stairs, backward in step-to pattern with heavy UE on rails/exited the pool via lift.  *stair negotiation rear facing descending 7 steps. Instruction for safe execution. Requires 3 standing rest periods * forward gait UE on barbell across width of pool x 4 times with (rest break after each width) *modified L stretch (some LBP relief) * backward gait UE on barbell x 3 width (rest break after each width) *  UE On barbell next to wall, sidestepping R/L 4 ft x 3, rest break * STS from bench in water, feet on ground, UE on barbell x 10    Pt  requires the buoyancy and hydrostatic pressure of water for support, and to offload joints by unweighting joint load by at least 50 % in navel deep water and by at least 75-80% in chest to neck deep water.  Viscosity of the water is needed for resistance of strengthening. Water current perturbations provides challenge to standing balance requiring increased core activation.                                                                                                                                    PATIENT EDUCATION:  Education details: reacquainting with aquatic therapy  Person educated: Patient Education method: Programmer, multimedia, demo Education comprehension: verbalized understanding  HOME EXERCISE PROGRAM: TBA  ASSESSMENT:  CLINICAL IMPRESSION: Pt entered pool again today rear facing with heavy UE support.  She does report some increase in thoracic back pain afterwards which does resolve given time and deep submersion. She does require extra rest period time but puts forth good effort.  STS transfers continue to improve in rep and quality/gaining immediate standing balance. Used lift out for safety at end of session today.  Pt remains motivated to progress and is gradually progressing towards goals.     Initial Impression Patient is a 63 y.o. f who was seen today for physical therapy evaluation and treatment for failed Back Syndrome of Lumbar spine. She presents by herself using transportation services to get here.  She does walk back to setting but with difficulty needing to rest at least x 2 pushing rollator.  She reports significant pain for past several years following an unsuccessful lumbar surgery.  She is highly pain sensitive with all movement as well as exhibiting symptoms of hyperesthesia throughout rle.  She was limited due to pain with assessment today.  She is a good candidate for skilled PT with concentration initially in aquatics.  Will trial 1 x a week to build tolerance to  level of activity required to participate (transportation, distance to and from setting, changing clothes and exercise) then increase to 2 x week.  She is extremely limited in all functional mobility and ADL's due to deficits. She is a good candidate for aquatic intervention and will benefit from the properties of water to progress towards functional goals. She is agreement with POC. Will need lift for entrance/exit to pool  OBJECTIVE IMPAIRMENTS: Abnormal gait, decreased activity tolerance, decreased balance, decreased endurance, decreased mobility, difficulty walking, decreased ROM, decreased strength, increased muscle spasms, impaired sensation, postural dysfunction, obesity, and pain.   ACTIVITY LIMITATIONS: carrying, lifting, bending, sitting, standing, squatting, sleeping, stairs, transfers, bed mobility, bathing, dressing, hygiene/grooming, locomotion level, and caring for others  PARTICIPATION LIMITATIONS: meal prep, cleaning, laundry, driving, shopping, community activity, occupation, and yard work  PERSONAL FACTORS:  Fitness, Past/current experiences, Time since onset of injury/illness/exacerbation, and 3+ comorbidities: See Pmhx are also affecting patient's functional outcome.   REHAB POTENTIAL: Fair due to co-morbidities  CLINICAL DECISION MAKING: Unstable/unpredictable  EVALUATION COMPLEXITY: High   GOALS: Goals reviewed with patient? Yes  SHORT TERM GOALS: Target date: 11/17/23  Pt will tolerate full aquatic sessions consistently without increase in pain and with improving function to demonstrate good toleration and effectiveness of intervention.  Baseline: Goal status: MET- 01/06/24  2.  Pt will amb x 10 minutes continuously submerged to demonstrate improving endurance Baseline:  Goal status: IN PROGRESS- 01/06/24  3.  Pt will perform STS from pool bench x 10 without LOB Baseline: (with feet on ground)  Goal status:MET -01/06/24   LONG TERM GOALS: Target date: POC  date  Pt to improve on ODI by  up to 10% % to demonstrate statistically significant Improvement in function. Baseline: 38/50=76%  Goal status: INITIAL  2.  Pt will report decreased frequency of waking at night by 50% Baseline:  Goal status: INITIAL  3.  Pt will tolerate walking to and from setting and engaging in aquatic therapy session without excessive fatigue or increase in pain to demonstrate improved toleration to activity. Baseline:  Goal status: IN PROGRESS - 01/06/24  4.  Pt will tolerate stair climbing into and exiting pool x 7 steps ascending and descending 6 steps without use of handrail Baseline: entered pool via stairs this date Goal status:  IN PROGRESS - 01/06/24  5.  Pt will report decrease in pain by at least 50% for improved toleration to activity/quality of life and to demonstrate improved management of pain. Baseline:  Goal status: INITIAL  6.  Pt will be indep with final HEP's (land and aquatic as appropriate) for continued management of condition Baseline:  Goal status: INITIAL  PLAN:  PT FREQUENCY: 1-2x/week  PT DURATION: 10 weeks  PLANNED INTERVENTIONS: 97164- PT Re-evaluation, 97750- Physical Performance Testing, 97110-Therapeutic exercises, 97530- Therapeutic activity, 97112- Neuromuscular re-education, 97535- Self Care, 02859- Manual therapy, [id number]- Gait training, [phone]- Orthotic Initial, [phone]- Aquatic Therapy, [phone]- Electrical stimulation (unattended), [phone]- Electrical stimulation (manual), [id number]- Ionotophoresis 4mg /ml Dexamethasone , 79439 (1-2 muscles), 20561 (3+ muscles)- Dry Needling, Patient/Family education, Balance training, Stair training, Taping, Joint mobilization, DME instructions, Cryotherapy, and Moist heat.  PLAN FOR NEXT SESSION: aquatics: trial toleration; general strengthening and stretching; balance and gait training, pain management  Ronal Foots) Eyvette Cordon MPT 01/15/24 11:55 AM [facility] [address] [place], [place], [address] Phone: [phone]   Fax:  [phone]

## 2024-01-18 NOTE — Assessment & Plan Note (Signed)
 BP is not well controlled. S variable compliance with her current medications.  Encouraged to use medications regularly.  Low-sodium diet encouraged.

## 2024-01-18 NOTE — Assessment & Plan Note (Signed)
 Diabetes has been poorly controlled.  Increasing Toujeo  to 68 units and NovoLog  to 14 units.  She will continue Mounjaro  at 2.5 mg for an additional month.  Increase to 5 mg thereafter.  Continue bowel regimen for constipation.  Associated CKD is stable at this time.  Her most recent GFR was 61.  Her baseline GFR is around 45-50.

## 2024-01-18 NOTE — Assessment & Plan Note (Signed)
 She has had recurrent episodes of headache and migraine.  Initially thought to be related to blood pressure however blood pressure is now better controlled and symptoms persist.  MRI of the brain as well as MRV were unremarkable.  Butalbital  renewed.

## 2024-01-18 NOTE — Assessment & Plan Note (Signed)
 She has significant difficulties with ADLs including bathing and grooming as well as dressing herself.  Related to her disability from chronic low back pain.

## 2024-01-18 NOTE — Assessment & Plan Note (Signed)
 Continue on duloxetine  at current strength.

## 2024-01-18 NOTE — Progress Notes (Signed)
 Gabriela Guerrero - 63 y.o. female MRN [id number]  Date of birth: Jan 13, 1961  Subjective Chief Complaint  Patient presents with   Diabetes    HPI Gabriela Guerrero is a 63 y.o. female here today for follow-up visit.  She reports that she is doing okay.  She has been working with a clinical pharmacist to obtain better control of her blood pressure and blood sugars.  She has not been as compliant with taking her blood pressure medications.  She was prescribed amlodipine , carvedilol , clonidine , furosemide .  She has been tolerating medications well.  She does not monitor her blood pressure at home.  She has not had symptoms including chest pain, shortness of breath, palpitations, headaches or vision changes.  Her last A1c was 11.2%.  She has been using Mounjaro  but experienced some constipation eliquis.  This is improved overall remains on 2.5 mg strength.  Additionally she remains on Toujeo  and NovoLog .  Per last clinical pharmacist note suggest that she increase this.  She receives Meals on Wheels and does not have a whole lot of control over her diet.  She remains on duloxetine  for management of depression as well as chronic low back pain.  ROS:  A comprehensive ROS was completed and negative except as noted per HPI   No Known Allergies  Past Medical History:  Diagnosis Date   Hypertension     Past Surgical History:  Procedure Laterality Date   LUMBAR FUSION      Social History   Socioeconomic History   Marital status: Single    Spouse name: Not on file   Number of children: Not on file   Years of education: Not on file   Highest education level: Not on file  Occupational History   Occupation: Disabled  Tobacco Use   Smoking status: Never   Smokeless tobacco: Never  Vaping Use   Vaping status: Never Used  Substance and Sexual Activity   Alcohol use: Never   Drug use: Never   Sexual activity: Not Currently    Partners: Male    Birth control/protection: Abstinence  Other  Topics Concern   Not on file  Social History Narrative   Not on file   Social Drivers of Health   Financial Resource Strain: High Risk (11/22/2022)   Overall Financial Resource Strain (CARDIA)    Difficulty of Paying Living Expenses: Hard  Food Insecurity: Low Risk  (07/17/2023)   Received from [facility]   Hunger Vital Sign    Within the past 12 months, you worried that your food would run out before you got money to buy more: Never true    Within the past 12 months, the food you bought just didn't last and you didn't have money to get more. : Never true  Transportation Needs: No Transportation Needs (07/17/2023)   Received from [facility]    In the past 12 months, has lack of reliable transportation kept you from medical appointments, meetings, work or from getting things needed for daily living? : No  Physical Activity: Unknown (02/22/2022)   Received from [facility]   Exercise Vital Sign    On average, how many days per week do you engage in moderate to strenuous exercise (like a brisk walk)?: 1 day    Minutes of Exercise per Session: Not on file  Stress: Stress Concern Present (09/20/2022)   Received from [facility] - Occupational Stress Questionnaire    Feeling of  Stress : Very much  Social Connections: Somewhat Isolated (02/22/2022)   Received from [facility]   Social Network    How would you rate your social network (family, work, friends)?: Restricted participation with some degree of social isolation    Family History  Problem Relation Age of Onset   Hypertension Mother    Breast cancer Mother    Hypertension Sister        died of aneurysm from HTN   Stroke Sister    Migraines Neg Hx     Health Maintenance  Topic Date Due   Medicare Annual Wellness (AWV)  Never done   HIV Screening  Never done   Hepatitis C Screening  Never done   Cervical Cancer Screening (HPV/Pap Cotest)  Never done    Zoster Vaccines- Shingrix (1 of 2) 04/14/2024 (Originally 03/27/2011)   Influenza Vaccine  07/20/2024 (Originally 11/21/2023)   Pneumococcal Vaccine: 50+ Years (2 of 2 - PCV) 01/13/2025 (Originally 04/23/2019)   COVID-19 Vaccine (3 - 2025-26 season) 01/29/2025 (Originally 12/22/2023)   FOOT EXAM  01/22/2024   HEMOGLOBIN A1C  05/15/2024   OPHTHALMOLOGY EXAM  08/03/2024   Diabetic kidney evaluation - eGFR measurement  11/12/2024   Diabetic kidney evaluation - Urine ACR  11/12/2024   Mammogram  03/04/2025   DTaP/Tdap/Td (2 - Td or Tdap) 06/26/2025   Colonoscopy  01/31/2029   Hepatitis B Vaccines 19-59 Average Risk  Aged Out   HPV VACCINES  Aged Out   Meningococcal B Vaccine  Aged Out     ----------------------------------------------------------------------------------------------------------------------------------------------------------------------------------------------------------------- Physical Exam BP (!) 166/76 (BP Location: Left Arm, Patient Position: Sitting, Cuff Size: Large)   Pulse 66   Ht 5' 1 (1.549 m)   Wt 256 lb (116.1 kg)   SpO2 100%   BMI 48.37 kg/m   Physical Exam Constitutional:      Appearance: Normal appearance.  Eyes:     General: No scleral icterus. Cardiovascular:     Rate and Rhythm: Normal rate and regular rhythm.  Pulmonary:     Effort: Pulmonary effort is normal.     Breath sounds: Normal breath sounds.  Musculoskeletal:     Cervical back: Neck supple.  Neurological:     General: No focal deficit present.     Mental Status: She is alert.  Psychiatric:        Mood and Affect: Mood normal.        Behavior: Behavior normal.     ------------------------------------------------------------------------------------------------------------------------------------------------------------------------------------------------------------------- Assessment and Plan  Hypertension associated with stage 3a chronic kidney disease due to type 2 diabetes  mellitus (HCC) Diabetes has been poorly controlled.  Increasing Toujeo  to 68 units and NovoLog  to 14 units.  She will continue Mounjaro  at 2.5 mg for an additional month.  Increase to 5 mg thereafter.  Continue bowel regimen for constipation.  Associated CKD is stable at this time.  Her most recent GFR was 61.  Her baseline GFR is around 45-50.  Hypertension associated with diabetes (HCC) BP is not well controlled. S variable compliance with her current medications.  Encouraged to use medications regularly.  Low-sodium diet encouraged.  Decreased activities of daily living (ADL) She has significant difficulties with ADLs including bathing and grooming as well as dressing herself.  Related to her disability from chronic low back pain.   Migraine She has had recurrent episodes of headache and migraine.  Initially thought to be related to blood pressure however blood pressure is now better controlled and symptoms persist.  MRI of the  brain as well as MRV were unremarkable.  Butalbital  renewed.  MDD (major depressive disorder), recurrent, in partial remission Continue on duloxetine  at current strength.   Meds ordered this encounter  Medications   NOVOLOG  FLEXPEN 100 UNIT/ML FlexPen    Sig: Inject 14 Units into the skin 3 (three) times daily with meals.    Dispense:  15 mL    Refill:  2   TOUJEO  MAX SOLOSTAR 300 UNIT/ML Solostar Pen    Sig: Inject 68 Units into the skin daily.    Dispense:  21 mL    Refill:  1   butalbital -acetaminophen-caffeine  (FIORICET) 50-325-40 MG tablet    Sig: Take 1 tablet by mouth every 6 (six) hours as needed for headache.    Dispense:  14 tablet    Refill:  0   tirzepatide  (MOUNJARO ) 5 MG/0.5ML Pen    Sig: Inject 5 mg into the skin once a week.    Dispense:  6 mL    Refill:  0    Return in about 3 months (around 04/14/2024) for Type 2 Diabetes, Hypertension.

## 2024-01-22 ENCOUNTER — Encounter (HOSPITAL_BASED_OUTPATIENT_CLINIC_OR_DEPARTMENT_OTHER): Payer: Self-pay | Admitting: Physical Therapy

## 2024-01-22 ENCOUNTER — Ambulatory Visit (HOSPITAL_BASED_OUTPATIENT_CLINIC_OR_DEPARTMENT_OTHER): Payer: Medicare (Managed Care) | Attending: Sports Medicine | Admitting: Physical Therapy

## 2024-01-22 DIAGNOSIS — R2689 Other abnormalities of gait and mobility: Secondary | ICD-10-CM | POA: Diagnosis not present

## 2024-01-22 DIAGNOSIS — M6281 Muscle weakness (generalized): Secondary | ICD-10-CM | POA: Insufficient documentation

## 2024-01-22 DIAGNOSIS — M5459 Other low back pain: Secondary | ICD-10-CM | POA: Diagnosis not present

## 2024-01-22 NOTE — Therapy (Signed)
 OUTPATIENT PHYSICAL THERAPY THORACOLUMBAR TREATMENT   Patient Name: Gabriela Guerrero MRN: [id number] DOB:August 05, 1960, 63 y.o., female Today's Date: 01/22/2024  END OF SESSION:  PT End of Session - 01/22/24 1120     Visit Number 11    Number of Visits 20    Date for Recertification  02/13/24    Authorization Type medicare    Progress Note Due on Visit 15    PT Start Time 1113    PT Stop Time 1151    PT Time Calculation (min) 38 min    Activity Tolerance Patient tolerated treatment well    Behavior During Therapy [facility] for tasks assessed/performed             Past Medical History:  Diagnosis Date   Hypertension    Past Surgical History:  Procedure Laterality Date   LUMBAR FUSION     Patient Active Problem List   Diagnosis Date Noted   Migraine 09/24/2022   Decreased activities of daily living (ADL) 05/05/2022   Tongue lesion 03/25/2022   Right hip pain 01/02/2022   Chronic pain of right knee 01/02/2022   Breast pain, right 05/14/2021   Failed back syndrome of lumbar spine 08/15/2020   Stage 3a chronic kidney disease (HCC) 07/30/2019   MDD (major depressive disorder), recurrent, in partial remission 07/12/2019   Moderate mitral regurgitation 06/24/2019   Insomnia 06/01/2018   S/P lumbar spinal fusion 09/01/2017   Hypertension associated with stage 3a chronic kidney disease due to type 2 diabetes mellitus (HCC) 01/29/2017   Hypertension associated with diabetes (HCC) 01/26/2017   Diabetic retinopathy (HCC) 04/13/2013   GERD (gastroesophageal reflux disease) 07/23/2010    PCP: Velma Ku DO  REFERRING PROVIDER: Curtis Debby PARAS, MD   REFERRING DIAG: M96.1 (ICD-10-CM) - Failed back syndrome of lumbar spine   Rationale for Evaluation and Treatment: Rehabilitation  THERAPY DIAG:  Other low back pain  Muscle weakness (generalized)  Other abnormalities of gait and mobility  ONSET DATE: Chronic  SUBJECTIVE:                                                                                                                                                                                            SUBJECTIVE STATEMENT: Pt reports her transportation did not arrive so she drove herself.  1st time in a long time she has driven. Pain 7-8/10 LB   Initial Subjective Lumbar fusion 2019. No pain improvement.  Oa knee.Has had aquatic therapy in past. Had injections in past that did not work.  Pt reports being in bed most of time as that is where I feel the best.  Had  Home care earlier this year and have been doing the exercises they gave me using bands, exercises standing.  Hurts but feels good. Pain is constant and high.  PERTINENT HISTORY:  As per FI:Olfajm spondylosis  Failed back syndrome of lumbar spine:63 year old female with diabetes, morbid obesity, chronic low back pain, she is status post an L4-L5 fusion with persistent pain, she endorses she got no relief not even temporary. She does have a CT scan from 2 years ago that does show what appears to be a stable fusion, she has adjacent severe facet arthropathy, pain is better with flexion. . . . multilevel facet joint injections, likely right sided L3-L4 and right sided L5-S1.     PAIN:  Are you having pain? Yes: NPRS scale: current 8-9/10 Pain location: R LB with radiation into toes Pain description: sharp pain to the touch, ache Aggravating factors: car riding, walking, standing, transfers Relieving factors: meds; sitting or lying down with legs elevated  PRECAUTIONS: Fall  RED FLAGS: None   WEIGHT BEARING RESTRICTIONS: No  FALLS:  Has patient fallen in last 6 months? 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  DIAGNOSTIC FINDINGS:  None recent  PATIENT SURVEYS:  Modified Oswestry 38/50=76%   COGNITION: Overall cognitive status: Within functional limits for tasks assessed     SENSATION: Radicular pain into rle>Left to feet   POSTURE: decreased lumbar lordosis and flexed trunk   PALPATION: Moderate TTP Lumbar spine to hips Hyperesthesia throughout rle  LUMBAR ROM:   Limited by at least 75%   LOWER EXTREMITY MMT:    MMT Right eval Left eval  Hip flexion 2+P! 3P!  Hip extension    Hip abduction    Hip adduction    Hip internal rotation    Hip external rotation    Knee flexion 3 3  Knee extension 3 3  Ankle dorsiflexion    Ankle plantarflexion    Ankle inversion    Ankle eversion     (Blank rows = not tested)   FUNCTIONAL TESTS:  2 minute WT:89ft using rollator  GAIT: Distance walked: 300 ft to setting 2 standing rest periods Assistive device utilized: Walker - 4 wheeled Level of assistance: SBA Comments: antalgic, dragging heels, guarded posture, leaning on forearms  TREATMENT  [facility] Adult PT Treatment:                                                DATE: 01/15/24 Pt seen for aquatic therapy today.  Treatment took place in water 3.5-4.75 ft in depth at the [facility] pool. Temp of water was 91.  Pt entered pool via stairs, backward in step-to pattern with heavy UE on rails/exited the pool via lift.  *stair negotiation rear facing descending 7 steps entering pool.  Requires 3 standing rest periods * forward gait UE on barbell across width of pool x 4 times with (rest break after each width) *stair negotiation exiting pool ascending 7 steps.  VC for  technique and executions.  Requires cga. Heavy ue support of arms.  Pt requests use of Jacuzzi (unbilled) *continued stair negotiation 10 steps in then out.  Cues for  positioning, weight shifting; engaging quads.  She has ~ 15 minute rest period in between. Completes with cga initially then  min to rise the final 2 steps.    Pt requires the buoyancy and hydrostatic pressure of water for support, and to offload joints by unweighting joint load by at least 50 % in navel deep water and by at least 75-80% in chest to neck deep water.  Viscosity of the water is needed for resistance of strengthening. Water current perturbations provides challenge to standing balance requiring increased core activation.                                                                                                                                    PATIENT EDUCATION:  Education details: reacquainting with aquatic therapy  Person educated: Patient Education method: Explanation, demo Education comprehension: verbalized understanding  HOME EXERCISE PROGRAM: TBA  ASSESSMENT:  CLINICAL IMPRESSION: Pt running late for session and presents amb quickly with rollator into setting with good balance and improved toleration to distance of 400-500 ft. Pt entered pool again today rear facing with heavy UE support.  She completes without cueing using proper technique. She is motivated to progress with stair climbing for access to hot water Jacuzzi for reduction in LBP as well a further le strengthening.  She completes with assistance.  She is fatigued upon completion.  Will assess overall toleration to session at next.  Goals ongoing      Initial Impression Patient is a 63 y.o. f who was seen today for physical therapy evaluation and treatment for failed Back Syndrome of Lumbar spine. She presents by herself using transportation services to get here.  She does walk back to setting but with difficulty needing to rest at least x 2 pushing rollator.  She reports significant pain for past several years following an unsuccessful lumbar surgery.  She is highly pain sensitive with all movement  as well as exhibiting symptoms of hyperesthesia throughout rle.  She was limited due to pain with assessment today.  She is a good candidate for skilled PT with concentration initially in aquatics.  Will trial 1 x a week to build tolerance to level of activity required to participate (transportation, distance to and from setting, changing clothes and exercise) then increase to 2 x week.  She is extremely limited in all functional mobility and ADL's due to deficits. She is a good candidate for aquatic intervention and will benefit from the properties of water to progress towards functional goals. She is agreement with POC. Will need lift for entrance/exit to pool  OBJECTIVE IMPAIRMENTS: Abnormal gait, decreased activity tolerance, decreased balance, decreased endurance, decreased mobility, difficulty walking, decreased ROM, decreased strength, increased muscle spasms, impaired sensation, postural dysfunction, obesity, and  pain.   ACTIVITY LIMITATIONS: carrying, lifting, bending, sitting, standing, squatting, sleeping, stairs, transfers, bed mobility, bathing, dressing, hygiene/grooming, locomotion level, and caring for others  PARTICIPATION LIMITATIONS: meal prep, cleaning, laundry, driving, shopping, community activity, occupation, and yard work  PERSONAL FACTORS: Fitness, Past/current experiences, Time since onset of injury/illness/exacerbation, and 3+ comorbidities: See Pmhx are also affecting patient's functional outcome.   REHAB POTENTIAL: Fair due to co-morbidities  CLINICAL DECISION MAKING: Unstable/unpredictable  EVALUATION COMPLEXITY: High   GOALS: Goals reviewed with patient? Yes  SHORT TERM GOALS: Target date: 11/17/23  Pt will tolerate full aquatic sessions consistently without increase in pain and with improving function to demonstrate good toleration and effectiveness of intervention.  Baseline: Goal status: MET- 01/06/24  2.  Pt will amb x 10 minutes continuously submerged to  demonstrate improving endurance Baseline:  Goal status: IN PROGRESS- 01/06/24  3.  Pt will perform STS from pool bench x 10 without LOB Baseline: (with feet on ground)  Goal status:MET -01/06/24   LONG TERM GOALS: Target date: POC date  Pt to improve on ODI by  up to 10% % to demonstrate statistically significant Improvement in function. Baseline: 38/50=76%  Goal status: INITIAL  2.  Pt will report decreased frequency of waking at night by 50% Baseline:  Goal status: INITIAL  3.  Pt will tolerate walking to and from setting and engaging in aquatic therapy session without excessive fatigue or increase in pain to demonstrate improved toleration to activity. Baseline:  Goal status: IN PROGRESS - 01/06/24  4.  Pt will tolerate stair climbing into and exiting pool x 7 steps ascending and descending 6 steps without use of handrail Baseline: entered pool via stairs this date Goal status:  IN PROGRESS - 01/06/24  5.  Pt will report decrease in pain by at least 50% for improved toleration to activity/quality of life and to demonstrate improved management of pain. Baseline:  Goal status: INITIAL  6.  Pt will be indep with final HEP's (land and aquatic as appropriate) for continued management of condition Baseline:  Goal status: INITIAL  PLAN:  PT FREQUENCY: 1-2x/week  PT DURATION: 10 weeks  PLANNED INTERVENTIONS: 97164- PT Re-evaluation, 97750- Physical Performance Testing, 97110-Therapeutic exercises, 97530- Therapeutic activity, 97112- Neuromuscular re-education, 97535- Self Care, 02859- Manual therapy, [id number]- Gait training, [phone]- Orthotic Initial, [phone]- Aquatic Therapy, [phone]- Electrical stimulation (unattended), [phone]- Electrical stimulation (manual), [id number]- Ionotophoresis 4mg /ml Dexamethasone , 79439 (1-2 muscles), 20561 (3+ muscles)- Dry Needling, Patient/Family education, Balance training, Stair training, Taping, Joint mobilization, DME instructions, Cryotherapy, and Moist  heat.  PLAN FOR NEXT SESSION: aquatics: trial toleration; general strengthening and stretching; balance and gait training, pain management  Ronal Foots) Laprecious Austill MPT 01/22/24 12:38 PM [facility] [address] [place], [place], [address] Phone: [phone]   Fax:  [phone]

## 2024-01-28 ENCOUNTER — Ambulatory Visit (HOSPITAL_BASED_OUTPATIENT_CLINIC_OR_DEPARTMENT_OTHER): Payer: Medicare (Managed Care) | Admitting: Physical Therapy

## 2024-01-30 ENCOUNTER — Encounter (HOSPITAL_BASED_OUTPATIENT_CLINIC_OR_DEPARTMENT_OTHER): Payer: Self-pay | Admitting: Physical Therapy

## 2024-01-30 ENCOUNTER — Ambulatory Visit (HOSPITAL_BASED_OUTPATIENT_CLINIC_OR_DEPARTMENT_OTHER): Payer: Medicare (Managed Care) | Admitting: Physical Therapy

## 2024-01-30 DIAGNOSIS — M5459 Other low back pain: Secondary | ICD-10-CM | POA: Diagnosis not present

## 2024-01-30 DIAGNOSIS — M6281 Muscle weakness (generalized): Secondary | ICD-10-CM

## 2024-01-30 DIAGNOSIS — R2689 Other abnormalities of gait and mobility: Secondary | ICD-10-CM

## 2024-01-30 NOTE — Therapy (Signed)
 OUTPATIENT PHYSICAL THERAPY THORACOLUMBAR TREATMENT   Patient Name: Gabriela Guerrero MRN: [id number] DOB:06-28-1960, 63 y.o., female Today's Date: 01/30/2024  END OF SESSION:  PT End of Session - 01/30/24 0938     Visit Number 12    Number of Visits 20    Date for Recertification  02/13/24    Authorization Type medicare    Progress Note Due on Visit 15    PT Start Time 0930    PT Stop Time 1008    PT Time Calculation (min) 38 min    Behavior During Therapy [facility] for tasks assessed/performed             Past Medical History:  Diagnosis Date   Hypertension    Past Surgical History:  Procedure Laterality Date   LUMBAR FUSION     Patient Active Problem List   Diagnosis Date Noted   Migraine 09/24/2022   Decreased activities of daily living (ADL) 05/05/2022   Tongue lesion 03/25/2022   Right hip pain 01/02/2022   Chronic pain of right knee 01/02/2022   Breast pain, right 05/14/2021   Failed back syndrome of lumbar spine 08/15/2020   Stage 3a chronic kidney disease (HCC) 07/30/2019   MDD (major depressive disorder), recurrent, in partial remission 07/12/2019   Moderate mitral regurgitation 06/24/2019   Insomnia 06/01/2018   S/P lumbar spinal fusion 09/01/2017   Hypertension associated with stage 3a chronic kidney disease due to type 2 diabetes mellitus (HCC) 01/29/2017   Hypertension associated with diabetes (HCC) 01/26/2017   Diabetic retinopathy (HCC) 04/13/2013   GERD (gastroesophageal reflux disease) 07/23/2010    PCP: Velma Ku DO  REFERRING PROVIDER: Curtis Debby PARAS, MD   REFERRING DIAG: M96.1 (ICD-10-CM) - Failed back syndrome of lumbar spine   Rationale for Evaluation and Treatment: Rehabilitation  THERAPY DIAG:  Other low back pain  Muscle weakness (generalized)  Other abnormalities of gait and mobility  ONSET DATE: Chronic  SUBJECTIVE:                                                                                                                                                                                            SUBJECTIVE STATEMENT: Pt reports she missed last appt since transportation never arrived. She reports she thinks she is waking less at night since starting therapy, however yesterday didn't sleep well.     Initial Subjective Lumbar fusion 2019. No pain improvement.  Oa knee.Has had aquatic therapy in past. Had injections in past that did not work.  Pt reports being in bed most of time as that is where I feel the best.  Had Home care earlier  this year and have been doing the exercises they gave me using bands, exercises standing.  Hurts but feels good. Pain is constant and high.  PERTINENT HISTORY:  As per FI:Olfajm spondylosis  Failed back syndrome of lumbar spine:63 year old female with diabetes, morbid obesity, chronic low back pain, she is status post an L4-L5 fusion with persistent pain, she endorses she got no relief not even temporary. She does have a CT scan from 2 years ago that does show what appears to be a stable fusion, she has adjacent severe facet arthropathy, pain is better with flexion. . . . multilevel facet joint injections, likely right sided L3-L4 and right sided L5-S1.     PAIN:  Are you having pain? Yes: NPRS scale: current 10/10 Pain location: R LB with radiation into toes Pain description: sharp pain to the touch, ache Aggravating factors: car riding, walking, standing, transfers Relieving factors: meds; sitting or lying down with legs elevated  PRECAUTIONS: Fall  RED FLAGS: None   WEIGHT BEARING RESTRICTIONS: No  FALLS:  Has patient fallen in last 6 months? 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  DIAGNOSTIC FINDINGS:  None recent  PATIENT SURVEYS:  Modified Oswestry 38/50=76%   COGNITION: Overall cognitive status: Within functional limits for tasks assessed     SENSATION: Radicular pain into rle>Left to feet   POSTURE: decreased lumbar lordosis and flexed trunk   PALPATION: Moderate TTP Lumbar spine to hips Hyperesthesia throughout rle  LUMBAR ROM:   Limited by at least 75%   LOWER EXTREMITY MMT:    MMT Right eval Left eval  Hip flexion 2+P! 3P!  Hip extension    Hip abduction    Hip adduction    Hip internal rotation    Hip external rotation    Knee flexion 3 3  Knee extension 3 3  Ankle dorsiflexion    Ankle plantarflexion    Ankle inversion    Ankle eversion     (Blank rows = not tested)   FUNCTIONAL TESTS:  2 minute WT:33ft using rollator  GAIT: Distance walked: 300 ft to setting 2 standing rest periods Assistive device utilized: Walker - 4 wheeled Level of assistance: SBA Comments: antalgic, dragging heels, guarded posture, leaning on forearms  TREATMENT  [facility] Adult PT Treatment:                                                DATE:01/30/24 Pt seen for aquatic therapy today.  Treatment took place in water 3.5-4.75 ft in depth at the [facility] pool. Temp of water was 91.  Pt entered pool via stairs, backward in step-to pattern with heavy UE on rails/exited the pool via lift.  *stair negotiation rear facing descending 7 steps entering pool.  * UE on barbell: forward gait  across width of pool x 4 times; short break after each width * UE on barbell:  side stepping across pool -5 ft R/L x 3; short rest break;  *  UE on barbell:  backward walking next to wall 6 ft x 2; short rest break; repeated again * STS on bench in water with feet on blue step, UE On barbell x 5, cues for forward weight shift  and core engaged * Lt forward step up/R retro step down with bil rail x 2 reps    Pt requires the buoyancy and hydrostatic pressure of water for support, and to offload joints by unweighting joint load by at least 50 % in navel deep water and by at least 75-80% in chest to neck deep water.  Viscosity of the water is needed for resistance of strengthening. Water current perturbations provides challenge to standing balance requiring increased core activation.                                                                                                                                    PATIENT EDUCATION:  Education details: aquatic therapy progression/modification  Person educated: Patient Education method: Programmer, multimedia, demo Education comprehension: verbalized understanding  HOME EXERCISE PROGRAM: TBA  ASSESSMENT:  CLINICAL IMPRESSION: Pt reported some release in back pain with short backward gait next to pool wall today.  Pain lowered from 10 to 7/10 by end of session.  Pt did not use WC transport at end of session today, as she has in past. Pt is progressing gradually towards goals.       Initial Impression Patient is a 63 y.o. f who was seen today for physical therapy evaluation and treatment for failed Back Syndrome of Lumbar spine. She presents by herself using transportation services to get here.  She does walk back to setting but with difficulty needing to rest at least x 2 pushing rollator.  She reports significant pain for past several years following an unsuccessful lumbar surgery.  She is highly pain sensitive with all movement as well as exhibiting symptoms of hyperesthesia throughout rle.  She was limited due to pain with assessment today.  She is a good candidate for skilled PT with concentration initially in aquatics.  Will trial 1 x a week to build tolerance to level of activity required to participate (transportation, distance to and from setting, changing clothes and  exercise) then increase to 2 x week.  She is extremely limited in all functional mobility and ADL's due to deficits. She is a good candidate for aquatic intervention and will benefit from the properties of water to progress towards functional goals. She is agreement with POC. Will need lift for entrance/exit to pool  OBJECTIVE IMPAIRMENTS: Abnormal gait, decreased activity tolerance, decreased balance, decreased endurance, decreased mobility, difficulty walking, decreased ROM, decreased strength, increased muscle spasms, impaired sensation, postural dysfunction, obesity, and pain.   ACTIVITY LIMITATIONS: carrying, lifting, bending, sitting, standing, squatting, sleeping, stairs, transfers, bed mobility, bathing, dressing, hygiene/grooming, locomotion level, and caring for others  PARTICIPATION LIMITATIONS: meal prep, cleaning, laundry, driving, shopping, community activity, occupation, and yard work  PERSONAL FACTORS: Fitness, Past/current experiences, Time since onset of injury/illness/exacerbation, and 3+ comorbidities: See Pmhx are also affecting patient's functional outcome.   REHAB POTENTIAL: Fair due to co-morbidities  CLINICAL DECISION MAKING: Unstable/unpredictable  EVALUATION COMPLEXITY: High   GOALS: Goals reviewed with patient? Yes  SHORT TERM GOALS: Target date: 11/17/23  Pt will tolerate full aquatic sessions consistently without increase in pain and with improving function to demonstrate good toleration and effectiveness of intervention.  Baseline: Goal status: MET- 01/06/24  2.  Pt will amb x 10 minutes continuously submerged to demonstrate improving endurance Baseline:  Goal status: IN PROGRESS- 01/30/24  3.  Pt will perform STS from pool bench x 10 without LOB Baseline: (with feet on ground)  Goal status:MET -01/06/24   LONG TERM GOALS: Target date: POC date  Pt to improve on ODI by  up to 10% % to demonstrate statistically significant Improvement in  function. Baseline: 38/50=76%  Goal status: INITIAL  2.  Pt will report decreased frequency of waking at night by 50% Baseline:  Goal status: INITIAL  3.  Pt will tolerate walking to and from setting and engaging in aquatic therapy session without excessive fatigue or increase in pain to demonstrate improved toleration to activity. Baseline:  Goal status: IN PROGRESS - 01/06/24  4.  Pt will tolerate stair climbing into and exiting pool x 7 steps ascending and descending 6 steps without use of handrail Baseline: entered pool via stairs this date Goal status:  IN PROGRESS - 01/06/24  5.  Pt will report decrease in pain by at least 50% for improved toleration to activity/quality of life and to demonstrate improved management of pain. Baseline:  Goal status: INITIAL  6.  Pt will be indep with final HEP's (land and aquatic as appropriate) for continued management of condition Baseline:  Goal status: INITIAL  PLAN:  PT FREQUENCY: 1-2x/week  PT DURATION: 10 weeks  PLANNED INTERVENTIONS: 97164- PT Re-evaluation, 97750- Physical Performance Testing, 97110-Therapeutic exercises, 97530- Therapeutic activity, [id number]- Neuromuscular re-education, 97535- Self Care, 02859- Manual therapy, [id number]- Gait training, [phone]- Orthotic Initial, [phone]- Aquatic Therapy, [phone]- Electrical stimulation (unattended), [phone]- Electrical stimulation (manual), [id number]- Ionotophoresis 4mg /ml Dexamethasone , 79439 (1-2 muscles), 20561 (3+ muscles)- Dry Needling, Patient/Family education, Balance training, Stair training, Taping, Joint mobilization, DME instructions, Cryotherapy, and Moist heat.  PLAN FOR NEXT SESSION: aquatics: trial toleration; general strengthening and stretching; balance and gait training, pain management  Delon Aquas, PTA 01/30/24 10:12 AM [facility] [address] [place], [place], [address] Phone: [phone]   Fax:  [phone]

## 2024-02-03 ENCOUNTER — Encounter (HOSPITAL_BASED_OUTPATIENT_CLINIC_OR_DEPARTMENT_OTHER): Payer: Self-pay | Admitting: Physical Therapy

## 2024-02-03 ENCOUNTER — Telehealth: Payer: Self-pay

## 2024-02-03 ENCOUNTER — Other Ambulatory Visit (HOSPITAL_COMMUNITY): Payer: Self-pay

## 2024-02-03 ENCOUNTER — Ambulatory Visit (HOSPITAL_BASED_OUTPATIENT_CLINIC_OR_DEPARTMENT_OTHER): Payer: Medicare (Managed Care) | Admitting: Physical Therapy

## 2024-02-03 DIAGNOSIS — R2689 Other abnormalities of gait and mobility: Secondary | ICD-10-CM

## 2024-02-03 DIAGNOSIS — M5459 Other low back pain: Secondary | ICD-10-CM

## 2024-02-03 DIAGNOSIS — M6281 Muscle weakness (generalized): Secondary | ICD-10-CM

## 2024-02-03 NOTE — Therapy (Signed)
 OUTPATIENT PHYSICAL THERAPY THORACOLUMBAR TREATMENT   Patient Name: Gabriela Guerrero MRN: [id number] DOB:11/30/1960, 63 y.o., female Today's Date: 02/03/2024  END OF SESSION:  Gabriela Guerrero End of Session - 02/03/24 0833     Visit Number 13    Number of Visits 20    Date for Recertification  02/13/24    Authorization Type medicare    Progress Note Due on Visit 15    Gabriela Guerrero Start Time 0821    Gabriela Guerrero Stop Time 0845    Gabriela Guerrero Time Calculation (min) 24 min    Behavior During Therapy [facility] for tasks assessed/performed             Past Medical History:  Diagnosis Date   Hypertension    Past Surgical History:  Procedure Laterality Date   LUMBAR FUSION     Patient Active Problem List   Diagnosis Date Noted   Migraine 09/24/2022   Decreased activities of daily living (ADL) 05/05/2022   Tongue lesion 03/25/2022   Right hip pain 01/02/2022   Chronic pain of right knee 01/02/2022   Breast pain, right 05/14/2021   Failed back syndrome of lumbar spine 08/15/2020   Stage 3a chronic kidney disease (HCC) 07/30/2019   MDD (major depressive disorder), recurrent, in partial remission 07/12/2019   Moderate mitral regurgitation 06/24/2019   Insomnia 06/01/2018   S/P lumbar spinal fusion 09/01/2017   Hypertension associated with stage 3a chronic kidney disease due to type 2 diabetes mellitus (HCC) 01/29/2017   Hypertension associated with diabetes (HCC) 01/26/2017   Diabetic retinopathy (HCC) 04/13/2013   GERD (gastroesophageal reflux disease) 07/23/2010    PCP: Velma Ku DO  REFERRING PROVIDER: Curtis Debby PARAS, MD   REFERRING DIAG: M96.1 (ICD-10-CM) - Failed back syndrome of lumbar spine   Rationale for Evaluation and Treatment: Rehabilitation  THERAPY DIAG:  Other low back pain  Muscle weakness (generalized)  Other abnormalities of gait and mobility  ONSET DATE: Chronic  SUBJECTIVE:                                                                                                                                                                                            SUBJECTIVE STATEMENT: Gabriela Guerrero reports that she drove herself since transportation never arrived.    Initial Subjective Lumbar fusion 2019. No pain improvement.  Oa knee.Has had aquatic therapy in past. Had injections in past that did not work.  Gabriela Guerrero reports being in bed most of time as that is where I feel the best.  Had Home care earlier this year and have been doing the exercises they gave me using bands, exercises standing.  Hurts but feels  good. Pain is constant and high.  PERTINENT HISTORY:  As per FI:Olfajm spondylosis  Failed back syndrome of lumbar spine:63 year old female with diabetes, morbid obesity, chronic low back pain, she is status post an L4-L5 fusion with persistent pain, she endorses she got no relief not even temporary. She does have a CT scan from 2 years ago that does show what appears to be a stable fusion, she has adjacent severe facet arthropathy, pain is better with flexion. . . . multilevel facet joint injections, likely right sided L3-L4 and right sided L5-S1.     PAIN:  Are you having pain? Yes: NPRS scale: current 10/10 Pain location: R LB with radiation into toes Pain description: sharp pain to the touch, ache Aggravating factors: car riding, walking, standing, transfers Relieving factors: meds; sitting or lying down with legs elevated  PRECAUTIONS: Fall  RED FLAGS: None   WEIGHT BEARING RESTRICTIONS: No  FALLS:  Has patient fallen in last 6 months? 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  DIAGNOSTIC FINDINGS:  None recent  PATIENT SURVEYS:  Modified Oswestry 38/50=76%   COGNITION: Overall cognitive status: Within functional limits for tasks assessed     SENSATION: Radicular pain into rle>Left to feet   POSTURE: decreased lumbar lordosis and flexed trunk   PALPATION: Moderate TTP Lumbar spine to hips Hyperesthesia throughout rle  LUMBAR ROM:   Limited by at least 75%   LOWER EXTREMITY MMT:    MMT Right eval Left eval  Hip flexion 2+P! 3P!  Hip extension    Hip abduction    Hip adduction    Hip internal rotation    Hip external rotation    Knee flexion 3 3  Knee extension 3 3  Ankle dorsiflexion    Ankle plantarflexion    Ankle inversion    Ankle eversion     (Blank rows = not tested)   FUNCTIONAL TESTS:  2 minute WT:40ft using rollator  GAIT: Distance walked: 300 ft to setting 2 standing rest periods Assistive device utilized: Walker - 4 wheeled Level of assistance: SBA Comments: antalgic, dragging heels, guarded posture, leaning on forearms  TREATMENT  [facility] Adult Gabriela Guerrero Treatment:                                                DATE:02/03/24 Gabriela Guerrero seen for aquatic therapy today.  Treatment took place in water 3.5-4.75 ft in depth at the [facility] pool. Temp of water was 102.    *stair negotiation into hot tub (up 2- 6 steps forward, step-to, down 5 -4 steps, rear facing descending.) * seated on bench in water:  alternating LAQ with DF;  hip abdct/ add; cycling  * UE on barbell: STS x 4 * side stepping R/L 4 ft x 3 * forward gait 4 ft with barbell * stair negotiation out of hot tub (L lateral step up with BUE on rails x 5, rear facing descending 2 steps bil rail)  PATIENT EDUCATION:  Education details: aquatic therapy progression/modification  Person educated: Patient Education method: Programmer, multimedia, demo Education comprehension: verbalized understanding  HOME EXERCISE  PROGRAM: TBA  ASSESSMENT:  CLINICAL IMPRESSION: Gabriela Guerrero's session shortened due to late arrival (due to transportation difficulties).  She tolerated session well, but with increased fatigue in back and LEs.  Side stepping up Lt with bil rail worked well for her, but descending is still manageable when rear facing.  Gabriela Guerrero required WC transport at end of session today, due to increased fatigue. Gabriela Guerrero is progressing gradually towards goals.       Initial Impression Patient is a 63 y.o. f who was seen today for physical therapy evaluation and treatment for failed Back Syndrome of Lumbar spine. She presents by herself using transportation services to get here.  She does walk back to setting but with difficulty needing to rest at least x 2 pushing rollator.  She reports significant pain for past several years following an unsuccessful lumbar surgery.  She is highly pain sensitive with all movement as well as exhibiting symptoms of hyperesthesia throughout rle.  She was limited due to pain with assessment today.  She is a good candidate for skilled Gabriela Guerrero with concentration initially in aquatics.  Will trial 1 x a week to build tolerance to level of activity required to participate (transportation, distance to and from setting, changing clothes and exercise) then increase to 2 x week.  She is extremely limited in all functional mobility and ADL's due to deficits. She is a good candidate for aquatic intervention and will benefit from the properties of water to progress towards functional goals. She is agreement with POC. Will need lift for entrance/exit to pool  OBJECTIVE IMPAIRMENTS: Abnormal gait, decreased activity tolerance, decreased balance, decreased endurance, decreased mobility, difficulty walking, decreased ROM, decreased strength, increased muscle spasms, impaired sensation, postural dysfunction, obesity, and pain.   ACTIVITY LIMITATIONS: carrying, lifting, bending, sitting, standing, squatting, sleeping,  stairs, transfers, bed mobility, bathing, dressing, hygiene/grooming, locomotion level, and caring for others  PARTICIPATION LIMITATIONS: meal prep, cleaning, laundry, driving, shopping, community activity, occupation, and yard work  PERSONAL FACTORS: Fitness, Past/current experiences, Time since onset of injury/illness/exacerbation, and 3+ comorbidities: See Pmhx are also affecting patient's functional outcome.   REHAB POTENTIAL: Fair due to co-morbidities  CLINICAL DECISION MAKING: Unstable/unpredictable  EVALUATION COMPLEXITY: High   GOALS: Goals reviewed with patient? Yes  SHORT TERM GOALS: Target date: 11/17/23  Gabriela Guerrero will tolerate full aquatic sessions consistently without increase in pain and with improving function to demonstrate good toleration and effectiveness of intervention.  Baseline: Goal status: MET- 01/06/24  2.  Gabriela Guerrero will amb x 10 minutes continuously submerged to demonstrate improving endurance Baseline:  Goal status: IN PROGRESS- 01/30/24  3.  Gabriela Guerrero will perform STS from pool bench x 10 without LOB Baseline: (with feet on ground)  Goal status:MET -01/06/24   LONG TERM GOALS: Target date: POC date  Gabriela Guerrero to improve on ODI by  up to 10% % to demonstrate statistically significant Improvement in function. Baseline: 38/50=76%  Goal status: INITIAL  2.  Gabriela Guerrero will report decreased frequency of waking at night by 50% Baseline:  Goal status: INITIAL  3.  Gabriela Guerrero will tolerate walking to and from setting and engaging in aquatic therapy session without excessive fatigue or increase in pain to demonstrate improved toleration to activity. Baseline:  Goal status: IN PROGRESS - 01/06/24  4.  Gabriela Guerrero will tolerate stair climbing into and exiting pool x 7 steps ascending and descending 6  steps without use of handrail Baseline: entered hot tub both directions this date, with heavy use of rail Goal status:  IN PROGRESS - 02/03/24  5.  Gabriela Guerrero will report decrease in pain by at least 50% for  improved toleration to activity/quality of life and to demonstrate improved management of pain. Baseline:  Goal status: INITIAL  6.  Gabriela Guerrero will be indep with final HEP's (land and aquatic as appropriate) for continued management of condition Baseline:  Goal status: INITIAL  PLAN:  Gabriela Guerrero FREQUENCY: 1-2x/week  Gabriela Guerrero DURATION: 10 weeks  PLANNED INTERVENTIONS: 97164- Gabriela Guerrero Re-evaluation, 97750- Physical Performance Testing, 97110-Therapeutic exercises, 97530- Therapeutic activity, 97112- Neuromuscular re-education, 97535- Self Care, 02859- Manual therapy, [id number]- Gait training, [phone]- Orthotic Initial, [phone]- Aquatic Therapy, [phone]- Electrical stimulation (unattended), [phone]- Electrical stimulation (manual), [id number]- Ionotophoresis 4mg /ml Dexamethasone , 79439 (1-2 muscles), 20561 (3+ muscles)- Dry Needling, Patient/Family education, Balance training, Stair training, Taping, Joint mobilization, DME instructions, Cryotherapy, and Moist heat.  PLAN FOR NEXT SESSION: aquatics: trial toleration; general strengthening and stretching; balance and gait training, pain management  Delon Aquas, PTA 02/03/24 11:37 AM [facility] [address] [place], [place], [address] Phone: [phone]   Fax:  [phone]

## 2024-02-03 NOTE — Telephone Encounter (Signed)
 Pharmacy Patient Advocate Encounter   Received notification from Patient Pharmacy that prior authorization for Novolot is required/requested.   Insurance verification completed.   The patient is insured through [employer].   Per test claim:  Humalog is preferred by the insurance.  If suggested medication is appropriate, Please send in a new RX and discontinue this one. If not, please advise as to why it's not appropriate so that we may request a Prior Authorization. Please note, some preferred medications may still require a PA.  If the suggested medications have not been trialed and there are no contraindications to their use, the PA will not be submitted, as it will not be approved.   Can the patient be changed to the Humalog and send in a new order?

## 2024-02-04 ENCOUNTER — Other Ambulatory Visit: Payer: Self-pay | Admitting: Family Medicine

## 2024-02-04 MED ORDER — INSULIN LISPRO 100 UNIT/ML IJ SOLN: 14.0000 [IU] | Freq: Three times a day (TID) | INTRAMUSCULAR | 6 refills | Status: AC

## 2024-02-04 MED ORDER — INSULIN LISPRO 100 UNIT/ML IJ SOLN: 14.0000 [IU] | Freq: Three times a day (TID) | INTRAMUSCULAR | 6 refills | Status: DC

## 2024-02-04 NOTE — Telephone Encounter (Signed)
 Copied from CRM #[id number]. Topic: Clinical - Medication Prior Auth >> Feb 04, 2024 11:08 AM Tobias CROME wrote: Reason for CRM: Patient states she is completely out of her novolog  and requesting update on prior authorization.   Best callback number: [phone]

## 2024-02-04 NOTE — Telephone Encounter (Signed)
 Please review the previous TE. The patient is completely out of their med. Please advise, thanks.

## 2024-02-05 ENCOUNTER — Ambulatory Visit (HOSPITAL_BASED_OUTPATIENT_CLINIC_OR_DEPARTMENT_OTHER): Payer: Medicare (Managed Care) | Admitting: Physical Therapy

## 2024-02-05 ENCOUNTER — Encounter (HOSPITAL_BASED_OUTPATIENT_CLINIC_OR_DEPARTMENT_OTHER): Payer: Self-pay | Admitting: Physical Therapy

## 2024-02-05 DIAGNOSIS — R2689 Other abnormalities of gait and mobility: Secondary | ICD-10-CM

## 2024-02-05 DIAGNOSIS — M5459 Other low back pain: Secondary | ICD-10-CM | POA: Diagnosis not present

## 2024-02-05 DIAGNOSIS — M6281 Muscle weakness (generalized): Secondary | ICD-10-CM

## 2024-02-05 NOTE — Therapy (Signed)
 OUTPATIENT PHYSICAL THERAPY THORACOLUMBAR TREATMENT   Patient Name: Gabriela Guerrero MRN: [id number] DOB:06-Jun-1960, 63 y.o., female Today's Date: 02/05/2024  END OF SESSION:  PT End of Session - 02/05/24 0854     Visit Number 14    Number of Visits 20    Date for Recertification  02/13/24    Authorization Type medicare    Progress Note Due on Visit 15    PT Start Time 0845    PT Stop Time 0925    PT Time Calculation (min) 40 min    Behavior During Therapy [facility] for tasks assessed/performed             Past Medical History:  Diagnosis Date   Hypertension    Past Surgical History:  Procedure Laterality Date   LUMBAR FUSION     Patient Active Problem List   Diagnosis Date Noted   Migraine 09/24/2022   Decreased activities of daily living (ADL) 05/05/2022   Tongue lesion 03/25/2022   Right hip pain 01/02/2022   Chronic pain of right knee 01/02/2022   Breast pain, right 05/14/2021   Failed back syndrome of lumbar spine 08/15/2020   Stage 3a chronic kidney disease (HCC) 07/30/2019   MDD (major depressive disorder), recurrent, in partial remission 07/12/2019   Moderate mitral regurgitation 06/24/2019   Insomnia 06/01/2018   S/P lumbar spinal fusion 09/01/2017   Hypertension associated with stage 3a chronic kidney disease due to type 2 diabetes mellitus (HCC) 01/29/2017   Hypertension associated with diabetes (HCC) 01/26/2017   Diabetic retinopathy (HCC) 04/13/2013   GERD (gastroesophageal reflux disease) 07/23/2010    PCP: Velma Ku DO  REFERRING PROVIDER: Curtis Debby PARAS, MD   REFERRING DIAG: M96.1 (ICD-10-CM) - Failed back syndrome of lumbar spine   Rationale for Evaluation and Treatment: Rehabilitation  THERAPY DIAG:  Other low back pain  Muscle weakness (generalized)  Other abnormalities of gait and mobility  ONSET DATE: Chronic  SUBJECTIVE:                                                                                                                                                                                            SUBJECTIVE STATEMENT: Pt reports that she drove herself since transportation never arrived. She reports some improvement in mobility, even though her pain level remains high.    Initial Subjective Lumbar fusion 2019. No pain improvement.  Oa knee.Has had aquatic therapy in past. Had injections in past that did not work.  Pt reports being in bed most of time as that is where I feel the best.  Had Home care earlier this year and have been doing  the exercises they gave me using bands, exercises standing.  Hurts but feels good. Pain is constant and high.  PERTINENT HISTORY:  As per FI:Olfajm spondylosis  Failed back syndrome of lumbar spine:63 year old female with diabetes, morbid obesity, chronic low back pain, she is status post an L4-L5 fusion with persistent pain, she endorses she got no relief not even temporary. She does have a CT scan from 2 years ago that does show what appears to be a stable fusion, she has adjacent severe facet arthropathy, pain is better with flexion. . . . multilevel facet joint injections, likely right sided L3-L4 and right sided L5-S1.     PAIN:  Are you having pain? Yes: NPRS scale: current 10/10 Pain location: R LB with radiation into toes Pain description: sharp pain to the touch, ache Aggravating factors: car riding, walking, standing, transfers Relieving factors: meds; sitting or lying down with legs elevated  PRECAUTIONS: Fall  RED FLAGS: None   WEIGHT BEARING RESTRICTIONS: No  FALLS:  Has patient fallen in last 6 months? 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  DIAGNOSTIC FINDINGS:  None recent  PATIENT SURVEYS:  Modified Oswestry 38/50=76%   COGNITION: Overall cognitive status: Within functional limits for tasks assessed     SENSATION: Radicular pain into rle>Left to feet   POSTURE: decreased lumbar lordosis and flexed trunk   PALPATION: Moderate TTP Lumbar spine to hips Hyperesthesia throughout rle  LUMBAR ROM:   Limited by at least 75%   LOWER EXTREMITY MMT:    MMT Right eval Left eval  Hip flexion 2+P! 3P!  Hip extension    Hip abduction    Hip adduction    Hip internal rotation    Hip external rotation    Knee flexion 3 3  Knee extension 3 3  Ankle dorsiflexion    Ankle plantarflexion    Ankle inversion    Ankle eversion     (Blank rows = not tested)   FUNCTIONAL TESTS:  2 minute WT:43ft using rollator  GAIT: Distance walked: 300 ft to setting 2 standing rest periods Assistive device utilized: Walker - 4 wheeled Level of assistance: SBA Comments: antalgic, dragging heels, guarded posture, leaning on forearms  TREATMENT  [facility] Adult PT Treatment:                                                DATE:02/05/24 Pt seen for aquatic therapy today.  Treatment took place in water 3.5-4.75 ft in depth at the [facility] pool. Temp of water was 91.    *stair negotiation into pool ( rear facing descending x 7, step-to pattern, heavy UE ) * UE on barbell: forward walking 1.5 laps, resting after each width)  * UE on wall in 4+ ft: heel raises x 5, small range hip abdct x 5 each LE; small range hip extension x 5 each LE; squatted rest  * UE on barball: walking backward 2 laps, rest against wall after each  width * STS from bench with feet on blue step, UE on barbell x 4 with SBA * stair negotiation out of pool - Lt lateral step up with bil UE on rail x 7   PATIENT EDUCATION:  Education details:  aquatic therapy progression/modification  Person educated: Patient Education method: Programmer, multimedia, demo Education comprehension: verbalized understanding  HOME EXERCISE PROGRAM: TBA  ASSESSMENT:  CLINICAL IMPRESSION: Pt was able to ascend and descend into the pool today (rear facing for entry, and sideways for exit) with heavy UE support on rails.  She requires short squatted rest breaks throughout session, but participates well throughout. Pt is progressing gradually towards goals. Therapist to complete progress note next visit.       Initial Impression Patient is a 63 y.o. f who was seen today for physical therapy evaluation and treatment for failed Back Syndrome of Lumbar spine. She presents by herself using transportation services to get here.  She does walk back to setting but with difficulty needing to rest at least x 2 pushing rollator.  She reports significant pain for past several years following an unsuccessful lumbar surgery.  She is highly pain sensitive with all movement as well as exhibiting symptoms of hyperesthesia throughout rle.  She was limited due to pain with assessment today.  She is a good candidate for skilled PT with concentration initially in aquatics.  Will trial 1 x a week to build tolerance to level of activity required to participate (transportation, distance to and from setting, changing clothes and exercise) then increase to 2 x week.  She is extremely limited in all functional mobility and ADL's due to deficits. She is a good candidate for aquatic intervention and will benefit from the properties of water to progress towards functional goals. She is agreement with POC. Will need lift for entrance/exit to pool  OBJECTIVE IMPAIRMENTS: Abnormal gait, decreased activity tolerance, decreased balance, decreased endurance, decreased mobility, difficulty walking, decreased ROM, decreased strength, increased muscle spasms, impaired sensation, postural dysfunction, obesity,  and pain.   ACTIVITY LIMITATIONS: carrying, lifting, bending, sitting, standing, squatting, sleeping, stairs, transfers, bed mobility, bathing, dressing, hygiene/grooming, locomotion level, and caring for others  PARTICIPATION LIMITATIONS: meal prep, cleaning, laundry, driving, shopping, community activity, occupation, and yard work  PERSONAL FACTORS: Fitness, Past/current experiences, Time since onset of injury/illness/exacerbation, and 3+ comorbidities: See Pmhx are also affecting patient's functional outcome.   REHAB POTENTIAL: Fair due to co-morbidities  CLINICAL DECISION MAKING: Unstable/unpredictable  EVALUATION COMPLEXITY: High   GOALS: Goals reviewed with patient? Yes  SHORT TERM GOALS: Target date: 11/17/23  Pt will tolerate full aquatic sessions consistently without increase in pain and with improving function to demonstrate good toleration and effectiveness of intervention.  Baseline: Goal status: MET- 01/06/24  2.  Pt will amb x 10 minutes continuously submerged to demonstrate improving endurance Baseline:  Goal status: IN PROGRESS- 01/30/24  3.  Pt will perform STS from pool bench x 10 without LOB Baseline: (with feet on ground)  Goal status:MET -01/06/24   LONG TERM GOALS: Target date: POC date  Pt to improve on ODI by  up to 10% % to demonstrate statistically significant Improvement in function. Baseline: 38/50=76%  Goal status: INITIAL  2.  Pt will report decreased frequency of waking at night by 50% Baseline:  Goal status: INITIAL  3.  Pt will tolerate walking to and from setting and engaging in aquatic therapy session without excessive fatigue or increase in pain to demonstrate improved toleration to activity. Baseline:  Goal  status: IN PROGRESS - 01/06/24  4.  Pt will tolerate stair climbing into and exiting pool x 7 steps ascending and descending 6 steps without use of handrail Baseline: see note Goal status:  Partially met 02/05/24  5.  Pt will  report decrease in pain by at least 50% for improved toleration to activity/quality of life and to demonstrate improved management of pain. Baseline:  Goal status: INITIAL  6.  Pt will be indep with final HEP's (land and aquatic as appropriate) for continued management of condition Baseline:  Goal status: INITIAL  PLAN:  PT FREQUENCY: 1-2x/week  PT DURATION: 10 weeks  PLANNED INTERVENTIONS: 97164- PT Re-evaluation, 97750- Physical Performance Testing, 97110-Therapeutic exercises, 97530- Therapeutic activity, 97112- Neuromuscular re-education, 97535- Self Care, 02859- Manual therapy, [id number]- Gait training, [phone]- Orthotic Initial, [phone]- Aquatic Therapy, [phone]- Electrical stimulation (unattended), [phone]- Electrical stimulation (manual), [id number]- Ionotophoresis 4mg /ml Dexamethasone , 79439 (1-2 muscles), 20561 (3+ muscles)- Dry Needling, Patient/Family education, Balance training, Stair training, Taping, Joint mobilization, DME instructions, Cryotherapy, and Moist heat.  PLAN FOR NEXT SESSION: aquatics: trial toleration; general strengthening and stretching; balance and gait training, pain management  Gabriela Guerrero, PTA 02/05/24 12:27 PM [facility] [address] [place], [place], [address] Phone: [phone]   Fax:  [phone]

## 2024-02-05 NOTE — Telephone Encounter (Signed)
 Attempted to contact the patient regarding the change in their DM medications. No answer, left a vm msg to return a call back to the clinic. Direct call back info provided.

## 2024-02-10 ENCOUNTER — Ambulatory Visit (HOSPITAL_BASED_OUTPATIENT_CLINIC_OR_DEPARTMENT_OTHER): Payer: Medicare (Managed Care) | Admitting: Physical Therapy

## 2024-02-12 ENCOUNTER — Ambulatory Visit (HOSPITAL_BASED_OUTPATIENT_CLINIC_OR_DEPARTMENT_OTHER): Payer: Medicare (Managed Care) | Admitting: Physical Therapy

## 2024-02-12 ENCOUNTER — Telehealth (HOSPITAL_BASED_OUTPATIENT_CLINIC_OR_DEPARTMENT_OTHER): Payer: Self-pay | Admitting: Physical Therapy

## 2024-02-12 NOTE — Telephone Encounter (Signed)
 LVM in regards to missed visit today.  Per attendance policy: If patient has 2 occasions of late cancellation (less than 24 hr notice) or no-show, patient will be allowed to schedule one appointment at a time.  After the 3rd incident of late cancellation or no-show, patient will be discharged and require a new referral from physician to resume treatment.   [address] [place]) Cylan Borum MPT 02/12/24 9:12 AM [facility] [address] [place], [place], [address] Phone: [phone]   Fax:  [phone]

## 2024-02-14 ENCOUNTER — Other Ambulatory Visit: Payer: Self-pay | Admitting: Family Medicine

## 2024-02-14 DIAGNOSIS — I152 Hypertension secondary to endocrine disorders: Secondary | ICD-10-CM

## 2024-02-14 DIAGNOSIS — K219 Gastro-esophageal reflux disease without esophagitis: Secondary | ICD-10-CM

## 2024-02-16 ENCOUNTER — Other Ambulatory Visit: Payer: Self-pay

## 2024-02-16 DIAGNOSIS — F329 Major depressive disorder, single episode, unspecified: Secondary | ICD-10-CM

## 2024-02-16 MED ORDER — DULOXETINE HCL 30 MG PO CPEP: 30.0000 mg | ORAL_CAPSULE | Freq: Every day | ORAL | 2 refills | Status: AC

## 2024-02-16 NOTE — Telephone Encounter (Signed)
 Copied from CRM [id number]. Topic: Clinical - Medication Question >> Feb 16, 2024  9:35 AM Rosaria A wrote: Reason for CRM: Patient is wanting to see if her medications could be sent in as 90 day supplies due to the government. Patient states that she does not want to run out of medication and does not want to get sick from not having her medication.  Patient would also like for the pharmacist [facility] to know as well and want her to give her a call back.

## 2024-02-16 NOTE — Telephone Encounter (Signed)
 Spoke with patient. She is requesting prescription refills as 90 day supply  Next upcoming appt is for 02/19/2024 with Dr. Alvia Requesting  Omeprazole  40mg   Last written 01/05/2024 Duloxetine  30mg  Last written 08/28/2023 Dr. Curtis Clonidine  0.1 mg  Last written 08/20/2023 Last OV 01/14/2024

## 2024-02-17 ENCOUNTER — Encounter (HOSPITAL_BASED_OUTPATIENT_CLINIC_OR_DEPARTMENT_OTHER): Payer: Self-pay | Admitting: Physical Therapy

## 2024-02-17 ENCOUNTER — Ambulatory Visit (HOSPITAL_BASED_OUTPATIENT_CLINIC_OR_DEPARTMENT_OTHER): Payer: Medicare (Managed Care) | Admitting: Physical Therapy

## 2024-02-17 DIAGNOSIS — M5459 Other low back pain: Secondary | ICD-10-CM

## 2024-02-17 DIAGNOSIS — M6281 Muscle weakness (generalized): Secondary | ICD-10-CM

## 2024-02-17 DIAGNOSIS — R2689 Other abnormalities of gait and mobility: Secondary | ICD-10-CM

## 2024-02-17 MED ORDER — CLONIDINE HCL 0.1 MG PO TABS: 0.1000 mg | ORAL_TABLET | Freq: Three times a day (TID) | ORAL | 1 refills | Status: AC

## 2024-02-17 MED ORDER — OMEPRAZOLE 40 MG PO CPDR: 40.0000 mg | DELAYED_RELEASE_CAPSULE | Freq: Every day | ORAL | 1 refills | Status: AC

## 2024-02-17 NOTE — Telephone Encounter (Signed)
 Second attempt to contact the patient regarding the change in their DM medications. No answer, left a vm msg to return a call back to the clinic. Direct call back info provided

## 2024-02-17 NOTE — Therapy (Signed)
 OUTPATIENT PHYSICAL THERAPY THORACOLUMBAR TREATMENT Progress Note/Re-cert Reporting Period 09/28/23 to 02/17/24  See note below for Objective Data and Assessment of Progress/Goals.      Patient Name: Gabriela Guerrero MRN: [id number] DOB:09/20/1960, 63 y.o., female Today's Date: 02/17/2024  END OF SESSION:  PT End of Session - 02/17/24 0916     Visit Number 15    Number of Visits 20    Date for Recertification  04/16/24    Authorization Type medicare    Progress Note Due on Visit 25    PT Start Time 0850    PT Stop Time 0935    PT Time Calculation (min) 45 min    Activity Tolerance Patient tolerated treatment well;Patient limited by pain    Behavior During Therapy [facility] for tasks assessed/performed              Past Medical History:  Diagnosis Date   Hypertension    Past Surgical History:  Procedure Laterality Date   LUMBAR FUSION     Patient Active Problem List   Diagnosis Date Noted   Migraine 09/24/2022   Decreased activities of daily living (ADL) 05/05/2022   Tongue lesion 03/25/2022   Right hip pain 01/02/2022   Chronic pain of right knee 01/02/2022   Breast pain, right 05/14/2021   Failed back syndrome of lumbar spine 08/15/2020   Stage 3a chronic kidney disease (HCC) 07/30/2019   MDD (major depressive disorder), recurrent, in partial remission 07/12/2019   Moderate mitral regurgitation 06/24/2019   Insomnia 06/01/2018   S/P lumbar spinal fusion 09/01/2017   Hypertension associated with stage 3a chronic kidney disease due to type 2 diabetes mellitus (HCC) 01/29/2017   Hypertension associated with diabetes (HCC) 01/26/2017   Diabetic retinopathy (HCC) 04/13/2013   GERD (gastroesophageal reflux disease) 07/23/2010    PCP: Velma Ku DO  REFERRING PROVIDER: Curtis Debby PARAS, MD   REFERRING DIAG: M96.1 (ICD-10-CM) - Failed back syndrome of lumbar spine   Rationale for Evaluation and Treatment: Rehabilitation  THERAPY DIAG:  Other low back pain  - Plan: PT plan of care cert/re-cert  Muscle weakness (generalized) - Plan: PT plan of care cert/re-cert  Other abnormalities of gait and mobility - Plan: PT plan of care cert/re-cert  ONSET DATE: Chronic  SUBJECTIVE:                                                                                                                                                                                           SUBJECTIVE STATEMENT: Pt reports difficulty getting here for the appointments but she makes every effort. She states therapy has helped as she is able to  stay out of bed longer, has more energy, able to endure pain longer. Pain reduction occurs in water but no pain relief when out. Pain 10/10. Lowest in water 5/10   Initial Subjective Lumbar fusion 2019. No pain improvement.  Oa knee.Has had aquatic therapy in past. Had injections in past that did not work.  Pt reports being in bed most of time as that is where I feel the best.  Had Home care earlier this year and have been doing the exercises they gave me using bands, exercises standing.  Hurts but feels good. Pain is constant and high.  PERTINENT HISTORY:  As per FI:Olfajm spondylosis  Failed back syndrome of lumbar spine:63 year old female with diabetes, morbid obesity, chronic low back pain, she is status post an L4-L5 fusion with persistent pain, she endorses she got no relief not even temporary. She does have a CT scan from 2 years ago that does show what appears to be a stable fusion, she has adjacent severe facet arthropathy, pain is better with flexion. . . . multilevel facet joint injections, likely right sided L3-L4 and right sided L5-S1.     PAIN:  Are you having pain? Yes: NPRS scale: current 10/10 Pain location: R LB with radiation into toes Pain description: sharp pain to the touch, ache Aggravating factors: car riding, walking, standing, transfers Relieving factors: meds; sitting or lying down with legs  elevated  PRECAUTIONS: Fall  RED FLAGS: None   WEIGHT BEARING RESTRICTIONS: No  FALLS:  Has patient fallen in last 6 months? 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  DIAGNOSTIC FINDINGS:  None recent  PATIENT SURVEYS:  Modified Oswestry 38/50=76%  02/17/24: 37/50=74%  COGNITION: Overall cognitive status: Within functional limits for tasks assessed     SENSATION: Radicular pain into rle>Left to feet   POSTURE: decreased lumbar lordosis and flexed trunk   PALPATION: Moderate TTP Lumbar spine to hips Hyperesthesia throughout rle  LUMBAR ROM:   Limited by at least 75%   LOWER EXTREMITY MMT:    MMT Right eval Left eval R / L 02/17/24  Hip flexion 2+P! 3P! 3P! / 4-P!  Hip extension     Hip abduction     Hip adduction     Hip internal rotation     Hip external rotation     Knee flexion 3 3 4   Knee extension 3 3 4   Ankle dorsiflexion     Ankle plantarflexion     Ankle inversion     Ankle eversion      (Blank rows = not tested)   FUNCTIONAL TESTS:  2 minute WT:69ft using rollator 02/17/24:119ft using rollator  GAIT: Distance walked: 300 ft to setting 2 standing rest periods Assistive device utilized: Walker - 4 wheeled Level of assistance: SBA Comments: antalgic, dragging heels, guarded posture, leaning on forearms  TREATMENT  [facility] Adult PT Treatment:  DATE:02/17/24  Functional/ objective testing for PN and re-cert  Pt seen for aquatic therapy today.  Treatment took place in water  3.5-4.75 ft in depth at the [facility] pool. Temp of water was 91.    *lift in and out today (save time) * UE on barbell: forward walking ,resting after each width *ue on barbell: backward x 4 laps resting between each  * UE on wall in 4+ ft: heel raises x 5, small range hip abdct x 5 each LE * seated on lift: cycling; hip add/abd; LAQ   PATIENT EDUCATION:  Education details: aquatic therapy progression/modification  Person educated: Patient Education method: Programmer, Multimedia, demo Education comprehension: verbalized understanding  HOME EXERCISE PROGRAM: TBA  ASSESSMENT:  CLINICAL IMPRESSION: PN :pt has made considerable improvements since the beginning physical therapy. Functional testing indicates improvement in le strength and walking/activity toleration.  She reports pain level continues to reach her normal high 10/10 but she is able to tolerate longer times standing and completing ADL's as well as other functional mobility tolerating pain for longer periods of time. She also reports a significant improvement in her ability to sleep waking only 2 x nightly rather than every hour due to pain.  She is planning on getting infor to attend [facility] for future access to be able to continue with aquatic exercises program.  She will continue to benefit from skilled PT in aquatic setting to further progress in function, manage pain and reach stated goals.       Initial Impression Patient is a 63 y.o. f who was seen today for physical therapy evaluation and treatment for failed Back Syndrome of Lumbar spine. She presents by herself using transportation services to get here.  She does walk back to setting but with difficulty needing to rest at least x 2 pushing rollator.  She reports significant pain for past several years following an unsuccessful lumbar surgery.  She is highly pain sensitive with all movement as well as exhibiting symptoms of hyperesthesia  throughout rle.  She was limited due to pain with assessment today.  She is a good candidate for skilled PT with concentration initially in aquatics.  Will trial 1 x a week to build tolerance to level of activity required to participate (transportation, distance to and from setting, changing clothes and exercise) then increase to 2 x week.  She is extremely limited in all functional mobility and ADL's due to deficits. She is a good candidate for aquatic intervention and will benefit from the properties of water to progress towards functional goals. She is agreement with POC. Will need lift for entrance/exit to pool  OBJECTIVE IMPAIRMENTS: Abnormal gait, decreased activity tolerance, decreased balance, decreased endurance, decreased mobility, difficulty walking, decreased ROM, decreased strength, increased muscle spasms, impaired sensation, postural dysfunction, obesity, and pain.   ACTIVITY LIMITATIONS: carrying, lifting, bending, sitting, standing, squatting, sleeping, stairs, transfers, bed mobility, bathing, dressing, hygiene/grooming, locomotion level, and caring for others  PARTICIPATION LIMITATIONS: meal prep, cleaning, laundry, driving, shopping, community activity, occupation, and yard work  PERSONAL FACTORS: Fitness, Past/current experiences, Time since onset of injury/illness/exacerbation, and 3+ comorbidities: See Pmhx are also affecting patient's functional outcome.   REHAB POTENTIAL: Fair due to co-morbidities  CLINICAL DECISION MAKING: Unstable/unpredictable  EVALUATION COMPLEXITY: High   GOALS: Goals reviewed with patient? Yes  SHORT TERM GOALS: Target date: 11/17/23  Pt will tolerate full aquatic sessions consistently without increase in pain and with improving function to demonstrate good toleration and effectiveness of intervention.  Baseline: Goal status: MET- 01/06/24  2.  Pt will amb x 10 minutes continuously submerged to demonstrate improving endurance Baseline:  Goal  status: IN PROGRESS- 01/30/24  3.  Pt will perform STS from pool bench x 10 without LOB Baseline: (with feet on ground)  Goal status:MET -01/06/24   LONG TERM GOALS: Target date: POC date  Pt to improve on ODI by  up to 10% to demonstrate statistically significant Improvement in function. Baseline: 38/50=76% / 37/50=74% Goal status: In progress 02/17/24  2.  Pt will report decreased frequency of waking at night by 50% Baseline:  Goal status: Met 02/17/24  3.  Pt will tolerate walking to and from setting and engaging in aquatic therapy session without excessive fatigue or increase in pain to demonstrate improved toleration to activity. Baseline:  Goal status: IN PROGRESS - 01/06/24  4.  Pt will tolerate stair climbing into and exiting pool x 7 steps ascending and descending 6 steps without use of handrail Baseline: see note Goal status:  Partially met 02/05/24  5.  Pt will report decrease in pain by at least 50% for improved toleration to activity/quality of life and to demonstrate improved management of pain. Baseline:  Goal status: In progress 02/17/24  6.  Pt will be indep with final HEP's (land and aquatic as appropriate) for continued management of condition Baseline:  Goal status: In progress 02/17/24  PLAN:  PT FREQUENCY: 1-2x/week  PT DURATION:8 weeks  PLANNED INTERVENTIONS: 97164- PT Re-evaluation, 97750- Physical Performance Testing, 97110-Therapeutic exercises, 97530- Therapeutic activity, 97112- Neuromuscular re-education, 97535- Self Care, 02859- Manual therapy, [phone]- Gait training, [phone]- Orthotic Initial, [phone]- Aquatic Therapy, [phone]- Electrical stimulation (unattended), [phone]- Electrical stimulation (manual), [id number]- Ionotophoresis 4mg /ml Dexamethasone , 79439 (1-2 muscles), [id number] (3+ muscles)- Dry Needling, Patient/Family education, Balance training, Stair training, Taping, Joint mobilization, DME instructions, Cryotherapy, and Moist heat.  PLAN FOR NEXT  SESSION: aquatics:  general strengthening and stretching; balance and gait training, pain management  Ronal Foots) Kaylib Furness MPT 02/17/24 10:06 AM [facility] [address] [place], [place], [address] Phone: [phone]   Fax:  [phone]

## 2024-02-18 NOTE — Telephone Encounter (Signed)
 Third attempt to contact the patient. No answer, left a detailed vm msg requesting a call back to the clinic. Direct call back info provided.

## 2024-02-19 ENCOUNTER — Ambulatory Visit: Payer: Medicare (Managed Care) | Admitting: Family Medicine

## 2024-02-19 ENCOUNTER — Ambulatory Visit (HOSPITAL_BASED_OUTPATIENT_CLINIC_OR_DEPARTMENT_OTHER): Payer: Medicare (Managed Care) | Admitting: Physical Therapy

## 2024-02-19 ENCOUNTER — Encounter (HOSPITAL_BASED_OUTPATIENT_CLINIC_OR_DEPARTMENT_OTHER): Payer: Self-pay | Admitting: Physical Therapy

## 2024-02-19 ENCOUNTER — Ambulatory Visit: Admitting: Family Medicine

## 2024-02-19 VITALS — BP 126/69 | HR 64 | Ht 61.0 in | Wt 261.0 lb

## 2024-02-19 DIAGNOSIS — M5459 Other low back pain: Secondary | ICD-10-CM

## 2024-02-19 DIAGNOSIS — N1831 Chronic kidney disease, stage 3a: Secondary | ICD-10-CM

## 2024-02-19 DIAGNOSIS — F3341 Major depressive disorder, recurrent, in partial remission: Secondary | ICD-10-CM | POA: Diagnosis not present

## 2024-02-19 DIAGNOSIS — Z794 Long term (current) use of insulin: Secondary | ICD-10-CM

## 2024-02-19 DIAGNOSIS — I152 Hypertension secondary to endocrine disorders: Secondary | ICD-10-CM | POA: Diagnosis not present

## 2024-02-19 DIAGNOSIS — E1159 Type 2 diabetes mellitus with other circulatory complications: Secondary | ICD-10-CM | POA: Diagnosis not present

## 2024-02-19 DIAGNOSIS — E1122 Type 2 diabetes mellitus with diabetic chronic kidney disease: Secondary | ICD-10-CM

## 2024-02-19 DIAGNOSIS — M6281 Muscle weakness (generalized): Secondary | ICD-10-CM

## 2024-02-19 DIAGNOSIS — G43819 Other migraine, intractable, without status migrainosus: Secondary | ICD-10-CM | POA: Diagnosis not present

## 2024-02-19 DIAGNOSIS — R2689 Other abnormalities of gait and mobility: Secondary | ICD-10-CM

## 2024-02-19 NOTE — Therapy (Signed)
 OUTPATIENT PHYSICAL THERAPY THORACOLUMBAR TREATMENT    Patient Name: Gabriela Guerrero MRN: [id number] DOB:January 06, 1961, 63 y.o., female Today's Date: 02/19/2024  END OF SESSION:  PT End of Session - 02/19/24 0858     Visit Number 16    Date for Recertification  04/16/24    Authorization Type medicare    Progress Note Due on Visit 25    PT Start Time 0848    PT Stop Time 0930    PT Time Calculation (min) 42 min    Activity Tolerance Patient tolerated treatment well;Patient limited by pain    Behavior During Therapy [facility] for tasks assessed/performed              Past Medical History:  Diagnosis Date   Hypertension    Past Surgical History:  Procedure Laterality Date   LUMBAR FUSION     Patient Active Problem List   Diagnosis Date Noted   Migraine 09/24/2022   Decreased activities of daily living (ADL) 05/05/2022   Tongue lesion 03/25/2022   Right hip pain 01/02/2022   Chronic pain of right knee 01/02/2022   Breast pain, right 05/14/2021   Failed back syndrome of lumbar spine 08/15/2020   Stage 3a chronic kidney disease (HCC) 07/30/2019   MDD (major depressive disorder), recurrent, in partial remission 07/12/2019   Moderate mitral regurgitation 06/24/2019   Insomnia 06/01/2018   S/P lumbar spinal fusion 09/01/2017   Hypertension associated with stage 3a chronic kidney disease due to type 2 diabetes mellitus (HCC) 01/29/2017   Hypertension associated with diabetes (HCC) 01/26/2017   Diabetic retinopathy (HCC) 04/13/2013   GERD (gastroesophageal reflux disease) 07/23/2010    PCP: Velma Ku DO  REFERRING PROVIDER: Curtis Debby PARAS, MD   REFERRING DIAG: M96.1 (ICD-10-CM) - Failed back syndrome of lumbar spine   Rationale for Evaluation and Treatment: Rehabilitation  THERAPY DIAG:  Other low back pain  Muscle weakness (generalized)  Other abnormalities of gait and mobility  ONSET DATE: Chronic  SUBJECTIVE:                                                                                                                                                                                            SUBJECTIVE STATEMENT: Pain high this weather 10/10   Initial Subjective Lumbar fusion 2019. No pain improvement.  Oa knee.Has had aquatic therapy in past. Had injections in past that did not work.  Pt reports being in bed most of time as that is where I feel the best.  Had Home care earlier this year and have been doing the exercises they gave me using bands, exercises standing.  Hurts but  feels good. Pain is constant and high.  PERTINENT HISTORY:  As per FI:Olfajm spondylosis  Failed back syndrome of lumbar spine:63 year old female with diabetes, morbid obesity, chronic low back pain, she is status post an L4-L5 fusion with persistent pain, she endorses she got no relief not even temporary. She does have a CT scan from 2 years ago that does show what appears to be a stable fusion, she has adjacent severe facet arthropathy, pain is better with flexion. . . . multilevel facet joint injections, likely right sided L3-L4 and right sided L5-S1.     PAIN:  Are you having pain? Yes: NPRS scale: current 10/10 Pain location: R LB with radiation into toes Pain description: sharp pain to the touch, ache Aggravating factors: car riding, walking, standing, transfers Relieving factors: meds; sitting or lying down with legs elevated  PRECAUTIONS: Fall  RED FLAGS: None   WEIGHT BEARING RESTRICTIONS: No  FALLS:  Has patient fallen in last 6 months? 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  DIAGNOSTIC FINDINGS:  None recent  PATIENT SURVEYS:  Modified Oswestry 38/50=76%  02/17/24: 37/50=74%  COGNITION: Overall cognitive status: Within functional limits for tasks assessed     SENSATION: Radicular pain into rle>Left to feet   POSTURE: decreased lumbar lordosis and flexed trunk   PALPATION: Moderate TTP Lumbar spine to hips Hyperesthesia throughout rle  LUMBAR ROM:   Limited by at least 75%   LOWER EXTREMITY MMT:    MMT Right eval Left eval R / L 02/17/24  Hip flexion 2+P! 3P! 3P! / 4-P!  Hip extension     Hip abduction     Hip adduction     Hip internal rotation     Hip external rotation     Knee flexion 3 3 4   Knee extension 3 3 4   Ankle dorsiflexion     Ankle plantarflexion     Ankle inversion     Ankle eversion      (Blank rows = not tested)   FUNCTIONAL TESTS:  2 minute WT:55ft using rollator 02/17/24:119ft using rollator  GAIT: Distance walked: 300 ft to setting 2 standing rest periods Assistive device utilized: Walker - 4 wheeled Level of assistance: SBA Comments: antalgic, dragging heels, guarded posture, leaning on forearms  TREATMENT  [facility] Adult PT Treatment:                                                DATE:02/19/24 Pt seen for aquatic therapy today.  Treatment took place in water 3.5-4.75 ft in depth at the [facility] pool. Temp of water was 91.     * UE on barbell: forward walking ,resting after each width *ue on barbell: backward x 4 laps resting between each  * UE on wall in 4+ ft: heel raises x 5, small range hip abdct x 5 each LE; squats x 5 *1/2 noodle pull down for TrA engagement wide stance 2 x 5 in 4.0 ft. Good  challenge *attempted side stepping but caused left sided LB cramp * seated on lift: cycling; hip add/abd; LAQ   PATIENT EDUCATION:  Education details: aquatic therapy progression/modification  Person educated: Patient Education method:  Explanation, demo Education comprehension: verbalized understanding  HOME EXERCISE PROGRAM: Access Code: [email or web address] URL: [email or web address] Date: 02/19/2024 Prepared by: Frankie Amaad Byers  Exercises - forward walking  - 1 x daily - 1-3 x weekly - Backward Walking  - 1 x daily - 1-3 x weekly - Side Stepping  - 1 x daily - 1-3 x weekly - Heel Toe Raises at Pool Wall  - 1 x daily - 1-3 x weekly - 1-2 sets - 10 reps - Standing Hip Abduction Adduction at Pool Wall  - 1 x daily - 1-3 x weekly - 1-2 sets - 5-10 reps - Standing [facility]  - 1 x daily - 1-3 x weekly - 1-3 sets - 5-10 reps NOT ISSUED  ASSESSMENT:  CLINICAL IMPRESSION: Pt has reached out to [facility] and will be touring tomorrow for possible access to warm water pool going forward. Aquatic Hep creation begun and plan to issue next session. Due to scheduling conflicts next session not for 1 month.  She relies on transportation so [facility] use difficult for her. Hopefull pt will gain access to pool to complete aquatic HEP until then. She reports immediate reduction of back pain. Side stepping right today causing left LB ms spasm, reduced with side stepping left.  She continues to require multiple rest periods between most exercises but did tolerate a full 40 minutes of water engagement. Goals ongoing.    PN :pt has made considerable improvements since the beginning physical therapy. Functional testing indicates improvement in le strength and walking/activity toleration.  She reports pain level continues to reach her normal high 10/10 but she is able to tolerate longer times standing and completing ADL's as well as other functional mobility tolerating pain for longer periods of time. She also reports a significant improvement in her ability to sleep waking only 2 x nightly rather than every hour due to pain.  She is planning on getting infor to attend [facility]pool for future access  to be able to continue with aquatic exercises program.  She will continue to benefit from skilled PT in aquatic setting to further progress in function, manage pain and reach stated goals.   OBJECTIVE IMPAIRMENTS: Abnormal gait, decreased activity tolerance, decreased balance, decreased endurance, decreased mobility, difficulty walking, decreased ROM, decreased strength, increased muscle spasms, impaired sensation, postural dysfunction, obesity, and pain.   ACTIVITY LIMITATIONS: carrying, lifting, bending, sitting, standing, squatting, sleeping, stairs, transfers, bed mobility, bathing, dressing, hygiene/grooming, locomotion level, and caring for others  PARTICIPATION LIMITATIONS: meal prep, cleaning, laundry, driving, shopping, community activity, occupation, and yard work  PERSONAL FACTORS: Fitness, Past/current experiences, Time since onset of injury/illness/exacerbation, and 3+ comorbidities: See Pmhx are also affecting patient's functional outcome.   REHAB POTENTIAL: Fair due to co-morbidities  CLINICAL DECISION MAKING: Unstable/unpredictable  EVALUATION COMPLEXITY: High   GOALS: Goals reviewed with patient? Yes  SHORT TERM GOALS: Target date: 11/17/23  Pt will tolerate full aquatic sessions consistently without increase in pain and with improving function to demonstrate good toleration and effectiveness of intervention.  Baseline: Goal status: MET- 01/06/24  2.  Pt will amb x 10 minutes continuously submerged to demonstrate improving endurance Baseline:  Goal status: IN PROGRESS- 01/30/24  3.  Pt will perform STS from pool bench x 10 without  LOB Baseline: (with feet on ground)  Goal status:MET -01/06/24   LONG TERM GOALS: Target date: POC date  Pt to improve on ODI by  up to 10% to demonstrate statistically significant Improvement in function. Baseline: 38/50=76% / 37/50=74% Goal status: In progress 02/17/24  2.  Pt will report decreased frequency of waking at night by  50% Baseline:  Goal status: Met 02/17/24  3.  Pt will tolerate walking to and from setting and engaging in aquatic therapy session without excessive fatigue or increase in pain to demonstrate improved toleration to activity. Baseline:  Goal status: IN PROGRESS - 01/06/24  4.  Pt will tolerate stair climbing into and exiting pool x 7 steps ascending and descending 6 steps without use of handrail Baseline: see note Goal status:  Partially met 02/05/24  5.  Pt will report decrease in pain by at least 50% for improved toleration to activity/quality of life and to demonstrate improved management of pain. Baseline:  Goal status: In progress 02/17/24  6.  Pt will be indep with final HEP's (land and aquatic as appropriate) for continued management of condition Baseline:  Goal status: In progress 02/17/24  PLAN:  PT FREQUENCY: 1-2x/week  PT DURATION:8 weeks  PLANNED INTERVENTIONS: 97164- PT Re-evaluation, 97750- Physical Performance Testing, 97110-Therapeutic exercises, 97530- Therapeutic activity, 97112- Neuromuscular re-education, 97535- Self Care, 02859- Manual therapy, [phone]- Gait training, [phone]- Orthotic Initial, [phone]- Aquatic Therapy, [phone]- Electrical stimulation (unattended), [phone]- Electrical stimulation (manual), [id number]- Ionotophoresis 4mg /ml Dexamethasone , 79439 (1-2 muscles), 20561 (3+ muscles)- Dry Needling, Patient/Family education, Balance training, Stair training, Taping, Joint mobilization, DME instructions, Cryotherapy, and Moist heat.  PLAN FOR NEXT SESSION: aquatics:  general strengthening and stretching; balance and gait training, pain management  Ronal Foots) Ziyana Morikawa MPT 02/19/24 9:09 AM [facility] [address] [place], [place], [address] Phone: [phone]   Fax:  [phone]

## 2024-02-19 NOTE — Progress Notes (Signed)
 Gabriela Guerrero - 63 y.o. female MRN [id number]  Date of birth: [birth year]  Subjective Chief Complaint  Patient presents with   Hypertension    HPI Gabriela Guerrero is a 63 year old female here today for follow-up visit.  She reports that she is doing pretty well at this time.  Continues with aquatic therapy which she finds to be very helpful.  She continues on amlodipine /valsartan , carvedilol , clonidine  for management of hypertension.  Blood pressure is well-controlled at this time.  No symptoms of hypotension.  She has not had chest pain, shortness of breath, palpitations, or vision change.  She is seeing endocrinology for management of her diabetes.  Continues on Toujeo  and Humalog at mealtime.  Remains on Mounjaro  as well.  She will be moving to 5 mg strength soon.  She remains on Lyrica  for management of chronic low back pain with neuropathy.  Depressive symptoms are stable with duloxetine  at current strength.  ROS:  A comprehensive ROS was completed and negative except as noted per HPI  No Known Allergies  Past Medical History:  Diagnosis Date   Hypertension     Past Surgical History:  Procedure Laterality Date   LUMBAR FUSION      Social History   Socioeconomic History   Marital status: Single    Spouse name: Not on file   Number of children: Not on file   Years of education: Not on file   Highest education level: Not on file  Occupational History   Occupation: Disabled  Tobacco Use   Smoking status: Never   Smokeless tobacco: Never  Vaping Use   Vaping status: Never Used  Substance and Sexual Activity   Alcohol use: Never   Drug use: Never   Sexual activity: Not Currently    Partners: Male    Birth control/protection: Abstinence  Other Topics Concern   Not on file  Social History Narrative   Not on file   Social Drivers of Health   Financial Resource Strain: High Risk (02/19/2024)   Overall Financial Resource Strain (CARDIA)    Difficulty of Paying  Living Expenses: Hard  Food Insecurity: Low Risk  (07/17/2023)   Received from [facility]   Hunger Vital Sign    Within the past 12 months, you worried that your food would run out before you got money to buy more: Never true    Within the past 12 months, the food you bought just didn't last and you didn't have money to get more. : Never true  Transportation Needs: No Transportation Needs (07/17/2023)   Received from [facility]    In the past 12 months, has lack of reliable transportation kept you from medical appointments, meetings, work or from getting things needed for daily living? : No  Physical Activity: Inactive (02/19/2024)   Exercise Vital Sign    Days of Exercise per Week: 0 days    Minutes of Exercise per Session: 0 min  Stress: Stress Concern Present (02/19/2024)   [employer] of Occupational Health - Occupational Stress Questionnaire    Feeling of Stress: Rather much  Social Connections: Moderately Isolated (02/19/2024)   Social Connection and Isolation Panel    Frequency of Communication with Friends and Family: Once a week    Frequency of Social Gatherings with Friends and Family: Once a week    Attends Religious Services: 1 to 4 times per year    Active Member of [employer] or Organizations: Yes    Attends Banker  Meetings: Never    Marital Status: Never married    Family History  Problem Relation Age of Onset   Hypertension Mother    Breast cancer Mother    Hypertension Sister        died of aneurysm from HTN   Stroke Sister    Migraines Neg Hx     Health Maintenance  Topic Date Due   Medicare Annual Wellness (AWV)  Never done   HIV Screening  Never done   Hepatitis C Screening  Never done   Cervical Cancer Screening (HPV/Pap Cotest)  Never done   Zoster Vaccines- Shingrix (1 of 2) 04/14/2024 (Originally 03/27/2011)   Influenza Vaccine  07/20/2024 (Originally 11/21/2023)   Pneumococcal Vaccine: 50+ Years (2 of 2 - PCV)  01/13/2025 (Originally 04/23/2019)   COVID-19 Vaccine (3 - 2025-26 season) 01/29/2025 (Originally 12/22/2023)   HEMOGLOBIN A1C  05/15/2024   OPHTHALMOLOGY EXAM  08/03/2024   Diabetic kidney evaluation - eGFR measurement  11/12/2024   Diabetic kidney evaluation - Urine ACR  11/12/2024   FOOT EXAM  02/18/2025   Mammogram  03/04/2025   DTaP/Tdap/Td (2 - Td or Tdap) 06/26/2025   Colonoscopy  01/31/2029   Hepatitis B Vaccines 19-59 Average Risk  Aged Out   HPV VACCINES  Aged Out   Meningococcal B Vaccine  Aged Out     ----------------------------------------------------------------------------------------------------------------------------------------------------------------------------------------------------------------- Physical Exam BP 126/69 (BP Location: Left Arm, Patient Position: Sitting, Cuff Size: Normal)   Pulse 64   Ht 5' 1 (1.549 m)   Wt 261 lb (118.4 kg)   SpO2 99%   BMI 49.32 kg/m   Physical Exam Constitutional:      Appearance: Normal appearance.  Eyes:     General: No scleral icterus. Cardiovascular:     Rate and Rhythm: Normal rate and regular rhythm.  Pulmonary:     Effort: Pulmonary effort is normal.     Breath sounds: Normal breath sounds.  Neurological:     Mental Status: She is alert.  Psychiatric:        Mood and Affect: Mood normal.        Behavior: Behavior normal.     ------------------------------------------------------------------------------------------------------------------------------------------------------------------------------------------------------------------- Assessment and Plan  Hypertension associated with stage 3a chronic kidney disease due to type 2 diabetes mellitus (HCC) Diabetes has been poorly controlled.  Continue Toujeo  68 units and Humalog at 14 units.   She will continue Mounjaro  at 2.5 mg for an additional month.  Increase to 5 mg thereafter.  Continue bowel regimen for constipation.  Associated CKD is stable at this  time.  . Lab Results  Component Value Date   NA 131 (L) 11/13/2023   K 5.2 11/13/2023   CREATININE 1.63 (H) 11/13/2023   EGFR 35 (L) 11/13/2023   GLUCOSE 432 (H) 11/13/2023     Hypertension associated with diabetes (HCC) BP is well controlled.   Encouraged to use medications regularly.  Low-sodium diet encouraged.  MDD (major depressive disorder), recurrent, in partial remission Continue on duloxetine  at current strength.  Migraine She has had recurrent episodes of headache and migraine.  Initially thought to be related to blood pressure however blood pressure is now better controlled and symptoms persist.  MRI of the brain as well as MRV were unremarkable.  Butalbital  renewed.   No orders of the defined types were placed in this encounter.   Return in about 6 months (around 08/19/2024) for Hypertension, Type 2 Diabetes.

## 2024-02-22 ENCOUNTER — Other Ambulatory Visit: Payer: Self-pay | Admitting: Family Medicine

## 2024-02-22 ENCOUNTER — Encounter: Payer: Self-pay | Admitting: Family Medicine

## 2024-02-22 NOTE — Assessment & Plan Note (Signed)
 She has had recurrent episodes of headache and migraine.  Initially thought to be related to blood pressure however blood pressure is now better controlled and symptoms persist.  MRI of the brain as well as MRV were unremarkable.  Butalbital  renewed.

## 2024-02-22 NOTE — Assessment & Plan Note (Signed)
 BP is well controlled.   Encouraged to use medications regularly.  Low-sodium diet encouraged.

## 2024-02-22 NOTE — Assessment & Plan Note (Signed)
 Diabetes has been poorly controlled.  Continue Toujeo  68 units and Humalog at 14 units.   She will continue Mounjaro  at 2.5 mg for an additional month.  Increase to 5 mg thereafter.  Continue bowel regimen for constipation.  Associated CKD is stable at this time.  . Lab Results  Component Value Date   NA 131 (L) 11/13/2023   K 5.2 11/13/2023   CREATININE 1.63 (H) 11/13/2023   EGFR 35 (L) 11/13/2023   GLUCOSE 432 (H) 11/13/2023

## 2024-02-22 NOTE — Assessment & Plan Note (Signed)
 Continue on duloxetine  at current strength.

## 2024-02-23 ENCOUNTER — Other Ambulatory Visit: Payer: Self-pay

## 2024-02-23 DIAGNOSIS — E1159 Type 2 diabetes mellitus with other circulatory complications: Secondary | ICD-10-CM

## 2024-02-23 MED ORDER — AMLODIPINE BESYLATE-VALSARTAN 10-320 MG PO TABS: 1.0000 | ORAL_TABLET | Freq: Every day | ORAL | 1 refills | Status: AC

## 2024-02-24 ENCOUNTER — Encounter (HOSPITAL_BASED_OUTPATIENT_CLINIC_OR_DEPARTMENT_OTHER): Payer: Self-pay | Admitting: Physical Therapy

## 2024-02-24 ENCOUNTER — Ambulatory Visit (HOSPITAL_BASED_OUTPATIENT_CLINIC_OR_DEPARTMENT_OTHER): Payer: Medicare (Managed Care) | Attending: Sports Medicine | Admitting: Physical Therapy

## 2024-02-24 DIAGNOSIS — R2689 Other abnormalities of gait and mobility: Secondary | ICD-10-CM | POA: Diagnosis not present

## 2024-02-24 DIAGNOSIS — M5459 Other low back pain: Secondary | ICD-10-CM | POA: Diagnosis not present

## 2024-02-24 DIAGNOSIS — M6281 Muscle weakness (generalized): Secondary | ICD-10-CM | POA: Diagnosis not present

## 2024-02-24 NOTE — Therapy (Signed)
 OUTPATIENT PHYSICAL THERAPY THORACOLUMBAR TREATMENT    Patient Name: Gabriela Guerrero MRN: [id number] DOB:03-16-1961, 63 y.o., female Today's Date: 02/24/2024  END OF SESSION:  PT End of Session - 02/24/24 0811     Visit Number 17    Number of Visits 20    Date for Recertification  04/16/24    Authorization Type medicare    Progress Note Due on Visit 25    PT Start Time 0803    PT Stop Time 0843    PT Time Calculation (min) 40 min    Activity Tolerance Patient tolerated treatment well;Patient limited by pain    Behavior During Therapy [facility] for tasks assessed/performed              Past Medical History:  Diagnosis Date   Hypertension    Past Surgical History:  Procedure Laterality Date   LUMBAR FUSION     Patient Active Problem List   Diagnosis Date Noted   Migraine 09/24/2022   Decreased activities of daily living (ADL) 05/05/2022   Tongue lesion 03/25/2022   Right hip pain 01/02/2022   Chronic pain of right knee 01/02/2022   Breast pain, right 05/14/2021   Failed back syndrome of lumbar spine 08/15/2020   Stage 3a chronic kidney disease (HCC) 07/30/2019   MDD (major depressive disorder), recurrent, in partial remission 07/12/2019   Moderate mitral regurgitation 06/24/2019   Insomnia 06/01/2018   S/P lumbar spinal fusion 09/01/2017   Hypertension associated with stage 3a chronic kidney disease due to type 2 diabetes mellitus (HCC) 01/29/2017   Hypertension associated with diabetes (HCC) 01/26/2017   Diabetic retinopathy (HCC) 04/13/2013   GERD (gastroesophageal reflux disease) 07/23/2010    PCP: Velma Ku DO  REFERRING PROVIDER: Curtis Debby PARAS, MD   REFERRING DIAG: M96.1 (ICD-10-CM) - Failed back syndrome of lumbar spine   Rationale for Evaluation and Treatment: Rehabilitation  THERAPY DIAG:  Other low back pain  Muscle weakness (generalized)  Other abnormalities of gait and mobility  ONSET DATE: Chronic  SUBJECTIVE:                                                                                                                                                                                            SUBJECTIVE STATEMENT: Pt reports that she visited [facility] pool, It was disappointing.  They don't take [employer].   Pt reports increased pain as she had to drive herself to appt and she states the ride is stressful. She reported improved ability to walk increased distance, ie: recent funeral was able to ambulate with cane/walker increased distance.    Initial Subjective Lumbar fusion  2019. No pain improvement.  Oa knee.Has had aquatic therapy in past. Had injections in past that did not work.  Pt reports being in bed most of time as that is where I feel the best.  Had Home care earlier this year and have been doing the exercises they gave me using bands, exercises standing.  Hurts but feels good. Pain is constant and high.  PERTINENT HISTORY:  As per FI:Olfajm spondylosis  Failed back syndrome of lumbar spine:63 year old female with diabetes, morbid obesity, chronic low back pain, she is status post an L4-L5 fusion with persistent pain, she endorses she got no relief not even temporary. She does have a CT scan from 2 years ago that does show what appears to be a stable fusion, she has adjacent severe facet arthropathy, pain is better with flexion. . . . multilevel facet joint injections, likely right sided L3-L4 and right sided L5-S1.     PAIN:  Are you having pain? Yes: NPRS scale: current 10/10 Pain location: R LB with radiation into toes Pain description: sharp pain to the touch, ache Aggravating factors: car riding, walking, standing, transfers Relieving factors: meds; sitting or lying down with legs elevated  PRECAUTIONS: Fall  RED FLAGS: None   WEIGHT BEARING RESTRICTIONS: No  FALLS:  Has patient fallen in last 6 months? 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  DIAGNOSTIC FINDINGS:  None recent  PATIENT SURVEYS:  Modified Oswestry 38/50=76%  02/17/24: 37/50=74%  COGNITION: Overall cognitive status: Within functional limits for tasks assessed     SENSATION: Radicular pain into rle>Left to feet   POSTURE: decreased lumbar lordosis and flexed trunk   PALPATION: Moderate TTP Lumbar spine to hips Hyperesthesia throughout rle  LUMBAR ROM:   Limited by at least 75%   LOWER EXTREMITY MMT:    MMT Right eval Left eval R / L 02/17/24  Hip flexion 2+P! 3P! 3P! / 4-P!  Hip extension     Hip abduction     Hip adduction     Hip internal rotation     Hip external rotation     Knee flexion 3 3 4   Knee extension 3 3 4   Ankle dorsiflexion     Ankle plantarflexion     Ankle inversion     Ankle eversion      (Blank rows = not tested)   FUNCTIONAL TESTS:  2 minute WT:56ft using rollator 02/17/24:167ft using rollator  GAIT: Distance walked: 300 ft to setting 2 standing rest periods Assistive device utilized: Walker - 4 wheeled Level of assistance: SBA Comments: antalgic, dragging heels, guarded posture, leaning on forearms  TREATMENT  [facility] Adult PT Treatment:                                                DATE:02/19/24 Pt seen for aquatic therapy today.  Treatment took place in water 3.5-4.75 ft in depth at the [facility] pool. Temp of water was 91-102.  Pt entered  pool via stairs, rear-facing with heavy UE on rails and increased time. She exited therapy pool via chair lift and spa with lateral step pattern and rail.   * UE on wall:  alternating hamstring curls x 10; relaxed squat * UE on barbell: forward walking ,resting after each width *UE on barbell: backward x 4 laps resting between each -> side stepping R/L  1/2 widths x 2  * stairs into spa- Lt side stepping up, and retro stepping down (cues for sequence and core engagement) * seated on bench in spa: cycling; hip add/abd; LAQ * STS witout UE support from bench in water * stairs - Lt side stepping up, and retro stepping down  PATIENT EDUCATION:  Education details: aquatic therapy progression/modification  Person educated: Patient Education method: Programmer, Multimedia, demo Education comprehension: verbalized understanding  HOME EXERCISE PROGRAM: Access Code: [email or web address] URL: [email or web address] Date: 02/19/2024 Prepared by: Frankie Ziemba  Exercises - forward walking  - 1 x daily - 1-3 x weekly - Backward Walking  - 1 x daily - 1-3 x weekly - Side Stepping  - 1 x daily - 1-3 x weekly - Heel Toe Raises at Pool Wall  - 1 x daily - 1-3 x weekly - 1-2 sets - 10 reps - Standing Hip Abduction Adduction at Pool Wall  - 1 x daily - 1-3 x weekly - 1-2 sets - 5-10 reps - Standing [facility]  - 1 x daily - 1-3 x weekly - 1-3 sets - 5-10 reps NOT ISSUED  ASSESSMENT:  CLINICAL IMPRESSION: Due to scheduling conflicts next session not for 1 month.  She continues to report immediate reduction of back pain when submerged 70%. Improved ability to side step without LB muscle spasm She continues to require multiple rest periods between most exercises.  Pt now walking to/from pool area consistently with use of RW; improved from [facility] transport.  plan to issue aquatic HEP next session.    PN :pt has made considerable improvements since the beginning physical therapy. Functional testing indicates improvement in le strength and walking/activity toleration.  She reports pain level continues to reach her normal high 10/10 but she is able to tolerate longer times  standing and completing ADL's as well as other functional mobility tolerating pain for longer periods of time. She also reports a significant improvement in her ability to sleep waking only 2 x nightly rather than every hour due to pain.  She is planning on getting infor to attend [facility]pool for future access to be able to continue with aquatic exercises program.  She will continue to benefit from skilled PT in aquatic setting to further progress in function, manage pain and reach stated goals.   OBJECTIVE IMPAIRMENTS: Abnormal gait, decreased activity tolerance, decreased balance, decreased endurance, decreased mobility, difficulty walking, decreased ROM, decreased strength, increased muscle spasms, impaired sensation, postural dysfunction, obesity, and pain.   ACTIVITY LIMITATIONS: carrying, lifting, bending, sitting, standing, squatting, sleeping, stairs, transfers, bed mobility, bathing, dressing, hygiene/grooming, locomotion level, and caring for others  PARTICIPATION LIMITATIONS: meal prep, cleaning, laundry, driving, shopping, community activity, occupation, and yard work  PERSONAL FACTORS: Fitness, Past/current experiences, Time since onset of injury/illness/exacerbation, and 3+ comorbidities: See Pmhx are also affecting patient's functional outcome.   REHAB POTENTIAL: Fair due to co-morbidities  CLINICAL DECISION MAKING: Unstable/unpredictable  EVALUATION COMPLEXITY: High   GOALS: Goals reviewed with patient? Yes  SHORT TERM GOALS: Target date: 11/17/23  Pt will tolerate full aquatic sessions consistently without increase in  pain and with improving function to demonstrate good toleration and effectiveness of intervention.  Baseline: Goal status: MET- 01/06/24  2.  Pt will amb x 10 minutes continuously submerged to demonstrate improving endurance Baseline:  Goal status: IN PROGRESS- 02/24/24  3.  Pt will perform STS from pool bench x 10 without  LOB Baseline: (with feet on ground)  Goal status:MET -01/06/24   LONG TERM GOALS: Target date: POC date  Pt to improve on ODI by  up to 10% to demonstrate statistically significant Improvement in function. Baseline: 38/50=76% / 37/50=74% Goal status: In progress 02/17/24  2.  Pt will report decreased frequency of waking at night by 50% Baseline:  Goal status: Met 02/17/24  3.  Pt will tolerate walking to and from setting and engaging in aquatic therapy session without excessive fatigue or increase in pain to demonstrate improved toleration to activity. Baseline:  Goal status: IN PROGRESS - 01/06/24  4.  Pt will tolerate stair climbing into and exiting pool x 7 steps ascending and descending 6 steps without use of handrail Baseline: see note Goal status:  Partially met 02/05/24  5.  Pt will report decrease in pain by at least 50% for improved toleration to activity/quality of life and to demonstrate improved management of pain. Baseline:  Goal status: In progress 02/17/24  6.  Pt will be indep with final HEP's (land and aquatic as appropriate) for continued management of condition Baseline:  Goal status: In progress 02/17/24  PLAN:  PT FREQUENCY: 1-2x/week  PT DURATION:8 weeks  PLANNED INTERVENTIONS: 97164- PT Re-evaluation, 97750- Physical Performance Testing, 97110-Therapeutic exercises, 97530- Therapeutic activity, 97112- Neuromuscular re-education, 97535- Self Care, 02859- Manual therapy, [phone]- Gait training, [phone]- Orthotic Initial, [phone]- Aquatic Therapy, [phone]- Electrical stimulation (unattended), [phone]- Electrical stimulation (manual), [id number]- Ionotophoresis 4mg /ml Dexamethasone , 79439 (1-2 muscles), 20561 (3+ muscles)- Dry Needling, Patient/Family education, Balance training, Stair training, Taping, Joint mobilization, DME instructions, Cryotherapy, and Moist heat.  PLAN FOR NEXT SESSION: aquatics:  general strengthening and stretching; balance and gait training, pain  management  Delon Aquas, PTA 02/24/24 12:49 PM [facility] [address] [place], [place], [address] Phone: [phone]   Fax:  [phone]

## 2024-03-01 ENCOUNTER — Telehealth: Payer: Self-pay | Admitting: Pharmacy Technician

## 2024-03-01 ENCOUNTER — Telehealth: Payer: Self-pay

## 2024-03-01 DIAGNOSIS — N1831 Chronic kidney disease, stage 3a: Secondary | ICD-10-CM

## 2024-03-01 NOTE — Progress Notes (Signed)
 03/01/2024 Name: Gabriela Guerrero MRN: [id number] DOB: 1961/01/16  Patient is appearing for a follow-up visit with the population health pharmacy technician. Last engaged with the clinical pharmacist to discuss diabetes and hypertension on 12/12/2023. Contacted patient today to discuss diabetes and hypertension.   Plan from last clinical pharmacist appointment:  Diabetes: Current medications: novolog  12 units TID cf, Mounjaro  2.5mg  weekly, Toujeo  64 units daily -Medications tried in the past: Ozempic   -Patient recently had to hold Mounjaro  2.5mg  due to eye surgery, but she will resume regular weekly dose this coming Sunday.  She does endorse constipation with this medication but is using a daily stool softener as well as daily Miralax, which are helping. -A1c recently elevated at 11.2% 2 weeks ago- this was 8.5% 06/18/2023 -FBG remains elevated with readings 150-190 -Using True Metrix meter; testing 1-2 times daily -Patient has started aquatic therapy and can resume 2 weeks after eye procedure -Endorses eating less with being on Mounjaro  -Plans to start walking outdoors again once weather is cooler  Medication Adherence Barriers Identified:  Patient made recommended medication changes per plan: Yes Patient informs she is currently using Mounjaro  2.5mg  every week. She informs she has 2 or 3 pens remaining and plans to start taking Mounjaro  5mg  next month. She informs she has the Mounjaro  5mg  in her refrigerator but was told to finish up the 2.5mg  Mounjaro  1st. She informs she takes Humalog 3 times a day before she eats and uses 14 units. She also informs taking Toujeo  Max 300 u/ml at night at a dose of 64 units.  Access issues with any new medication or testing device: Yes Patient is in need for a BP cuff. Per Dr Annemarie fill history data, Mounjaro  5mg  was filled for a quantity of 2 on 9/25, Humalog for 15ml on 10/26 and Toujeo  on 11/4 for a quantity of 18.  Patient is checking blood sugars as  prescribed: No She informs she has had a lot going on and has not been checking her blood sugars. She informs she has the supplies to do so and patient was encouraged to start checking blood sugars and jotting down the results so patient could provide to the doctors and pharmacists. She verbalized understanding.  Patient is followed by Dr. Delon Nigh at [facility]. She informs she thinks she had an appointment last over the summer (there is an encounter in EPIC from February 26). She informs she will call today and make an appointment.  Patient is checking blood pressure readings as prescribed: No Patient is not sure what happened to her blood pressure cuff. She informs she had one but she thinks her daughter may have taken it. She would like to see if her insurance would cover another one.Will request pharmacist send in a prescription for a blood pressure cuff.  Patient informs last week she tried to refill her Losartan  at [facility] and the Rph informed her that she should not be taking that any longer as she was switched to the combination pill Amlodipine /Valsartan . She informs she did not know this and had been taking both tablets. Per Dr Annemarie fill history data, Losartan  was last filled on 11/04/23 for 90 day supply and Amlodipine /Valsartan  was filled on 7/31 for 30 day supply and on 11/6 for 90 day supply. Patient wanted pharmacist to know.   Medication Adherence Barriers Addressed/Actions Taken:  Reviewed medication changes per plan from last clinical pharmacist note Medication Access for BP cuff Will discuss medication access concerns with pharmacist Educated  patient to contact pharmacy regarding new prescriptions and refills  Reviewed instructions for monitoring blood sugars at home and reminded patient to keep a written log to review with pharmacist Reminded patient of date/time of upcoming clinical pharmacist follow up and any upcoming PCP/specialists visits. Patient denies  transportation barriers to the appointment. Yes  Next clinical pharmacist appointment is scheduled for: 03/30/2024  Kate Caddy, CPhT [facility] Office: [phone] Email: [email or web address]

## 2024-03-01 NOTE — Progress Notes (Signed)
   03/01/2024  Patient ID: Gabriela Guerrero, female   DOB: [birth year], 63 y.o.   MRN: [id number]  Patient has not been able to monitor home BP due to no longer having home monitor.  Part D does not cover per test claim, so I have sent an order to [employer] via [employer] to see if part B will cover.  If not, I will see if patient's plan has an OTC benefit.  If they do not, and patient is not able to afford OTC, I can provide a home monitor.  Channing DELENA Mealing, PharmD, DPLA

## 2024-03-04 ENCOUNTER — Telehealth: Payer: Self-pay

## 2024-03-04 NOTE — Progress Notes (Signed)
   03/04/2024  Patient ID: Gabriela Guerrero, female   DOB: Jan 21, 1961, 63 y.o.   MRN: [id number]  Patient's Gabriela Guerrero does not cover a home BP monitor, so I contacted the plan to see if she has an OTC allowance.  She does have an OTC allowance on her plan that allows for the purchasing of OTC medication and supplies at [facility], [facility], [facility], [employer], etc. There is a currently balance sufficient to cover a home BP monitor, and she plans to purchase an automated, upper arm monitor at [place] this weekend.  Telephone visit is scheduled with me for 12/9 to follow-up on management of chronic disease states.  Gabriela Guerrero, PharmD, DPLA

## 2024-03-25 ENCOUNTER — Encounter (HOSPITAL_BASED_OUTPATIENT_CLINIC_OR_DEPARTMENT_OTHER): Payer: Self-pay | Admitting: Physical Therapy

## 2024-03-25 ENCOUNTER — Ambulatory Visit (HOSPITAL_BASED_OUTPATIENT_CLINIC_OR_DEPARTMENT_OTHER): Payer: Medicare (Managed Care) | Attending: Sports Medicine | Admitting: Physical Therapy

## 2024-03-25 DIAGNOSIS — M5459 Other low back pain: Secondary | ICD-10-CM | POA: Insufficient documentation

## 2024-03-25 DIAGNOSIS — M6281 Muscle weakness (generalized): Secondary | ICD-10-CM | POA: Diagnosis not present

## 2024-03-25 DIAGNOSIS — R2689 Other abnormalities of gait and mobility: Secondary | ICD-10-CM | POA: Diagnosis not present

## 2024-03-25 NOTE — Therapy (Signed)
 OUTPATIENT PHYSICAL THERAPY THORACOLUMBAR TREATMENT    Patient Name: Gabriela Guerrero MRN: [id number] DOB:[birth year], 63 y.o., female Today's Date: 03/25/2024  END OF SESSION:  PT End of Session - 03/25/24 1029     Visit Number 18    Number of Visits 20    Date for Recertification  04/16/24    Authorization Type medicare    Progress Note Due on Visit 25    PT Start Time 1015    PT Stop Time 1059    PT Time Calculation (min) 44 min    Activity Tolerance Patient tolerated treatment well;Patient limited by pain    Behavior During Therapy [facility] for tasks assessed/performed          Past Medical History:  Diagnosis Date   Hypertension    Past Surgical History:  Procedure Laterality Date   LUMBAR FUSION     Patient Active Problem List   Diagnosis Date Noted   Migraine 09/24/2022   Decreased activities of daily living (ADL) 05/05/2022   Tongue lesion 03/25/2022   Right hip pain 01/02/2022   Chronic pain of right knee 01/02/2022   Breast pain, right 05/14/2021   Failed back syndrome of lumbar spine 08/15/2020   Stage 3a chronic kidney disease (HCC) 07/30/2019   MDD (major depressive disorder), recurrent, in partial remission 07/12/2019   Moderate mitral regurgitation 06/24/2019   Insomnia 06/01/2018   S/P lumbar spinal fusion 09/01/2017   Hypertension associated with stage 3a chronic kidney disease due to type 2 diabetes mellitus (HCC) 01/29/2017   Hypertension associated with diabetes (HCC) 01/26/2017   Diabetic retinopathy (HCC) 04/13/2013   GERD (gastroesophageal reflux disease) 07/23/2010    PCP: Velma Ku DO  REFERRING PROVIDER: Curtis Debby PARAS, MD   REFERRING DIAG: M96.1 (ICD-10-CM) - Failed back syndrome of lumbar spine   Rationale for Evaluation and Treatment: Rehabilitation  THERAPY DIAG:  Other low back pain  Muscle weakness (generalized)  Other abnormalities of gait and mobility  ONSET DATE: Chronic  SUBJECTIVE:                                                                                                                                                                                            SUBJECTIVE STATEMENT: Pt reports that she has changed her insurance so that she can join [facility], which is 5 min from her house. She has not joined yet, but plans to request new referral from dr to transfer care over there in 2026.   Initial Subjective Lumbar fusion 2019. No pain improvement.  Oa knee.Has had aquatic therapy in past. Had injections in past that did not work.  Pt reports being in bed most of time as that is where I feel the best.  Had Home care earlier this year and have been doing the exercises they gave me using bands, exercises standing.  Hurts but feels good. Pain is constant and high.  PERTINENT HISTORY:  As per FI:Olfajm spondylosis  Failed back syndrome of lumbar spine:63 year old female with diabetes, morbid obesity, chronic low back pain, she is status post an L4-L5 fusion with persistent pain, she endorses she got no relief not even temporary. She does have a CT scan from 2 years ago that does show what appears to be a stable fusion, she has adjacent severe facet arthropathy, pain is better with flexion. . . . multilevel facet joint injections, likely right sided L3-L4 and right sided L5-S1.     PAIN:  Are you having pain? Yes: NPRS scale: current 10/10 Pain location: R LB with radiation into toes Pain description: sharp pain to the touch, ache Aggravating factors: car riding, walking, standing, transfers Relieving factors: meds; sitting or lying down with legs elevated  PRECAUTIONS: Fall  RED FLAGS: None   WEIGHT BEARING RESTRICTIONS: No  FALLS:  Has patient fallen in last 6 months? 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  DIAGNOSTIC FINDINGS:  None recent  PATIENT SURVEYS:  Modified Oswestry 38/50=76%  02/17/24: 37/50=74%  COGNITION: Overall cognitive status: Within functional limits for tasks assessed     SENSATION: Radicular pain into rle>Left to feet   POSTURE: decreased lumbar lordosis and flexed trunk   PALPATION: Moderate TTP Lumbar spine to hips Hyperesthesia throughout rle  LUMBAR ROM:   Limited by at least 75%   LOWER EXTREMITY MMT:    MMT Right eval Left eval R / L 02/17/24  Hip flexion 2+P! 3P! 3P! / 4-P!  Hip extension     Hip abduction     Hip adduction     Hip internal rotation     Hip external rotation     Knee flexion 3 3 4   Knee extension 3 3 4   Ankle dorsiflexion     Ankle plantarflexion     Ankle inversion     Ankle eversion      (Blank rows = not tested)   FUNCTIONAL TESTS:  2 minute WT:42ft using rollator 02/17/24:146ft using rollator  GAIT: Distance walked: 300 ft to setting 2 standing rest periods Assistive device utilized: Walker - 4 wheeled Level of assistance: SBA Comments: antalgic, dragging heels, guarded posture, leaning on forearms  TREATMENT  [facility] Adult PT Treatment:                                                DATE:03/25/24 Pt seen for aquatic therapy today.  Treatment took place in water 3.5-4.75 ft in depth at the [facility] pool. Temp of water was 91-102.  Pt entered pool via stairs, rear-facing with heavy UE on rails and increased time. She exited therapy pool via stairs in lateral step  pattern and rail.  * relaxed squat * UE on barbell: forward walking ,resting after each width *UE on barbell: backward x 2 laps resting between each -> side stepping R/L   * UE on wall:  alternating hip extension to toe touch; relaxed squat; marching; hip abdct/ add x 5 each; relaxed squat * UE on barbell:  walking backwards 1 width * STS witout UE support from bench in water, feet on blue step, with UE on barbell and close SBA x 4   PATIENT EDUCATION:  Education details: aquatic therapy progression/modification  Person educated: Patient Education method: Programmer, Multimedia, demo Education comprehension: verbalized understanding  HOME EXERCISE PROGRAM: Access Code: [email or web address] URL: [email or web address] Date: 02/19/2024 Prepared by: Frankie Ziemba  Exercises - forward walking  - 1 x daily - 1-3 x weekly - Backward Walking  - 1 x daily - 1-3 x weekly - Side Stepping  - 1 x daily - 1-3 x weekly - Heel Toe Raises at Pool Wall  - 1 x daily - 1-3 x weekly - 1-2 sets - 10 reps - Standing Hip Abduction Adduction at Pool Wall  - 1 x daily - 1-3 x weekly - 1-2 sets - 5-10 reps - Standing [facility]  - 1 x daily - 1-3 x weekly - 1-3 sets - 5-10 reps NOT ISSUED  ASSESSMENT:  CLINICAL IMPRESSION: Due to scheduling conflicts, pt hasn't been seen in 1 month.  She did not report immediate reduction of back pain  until seated on bench. Pt also did not take pain medicine prior to session, affecting pain level during session; remained high throughout requiring increased rest breaks in between exercises.  Pt able to ascend/descend steps into pool, however she needed transport from pool to lobby after session due to fatigue.  Pt plans to transfer PT care to [facility] (closer to her home) after first of year.     PN :pt has made considerable improvements since the beginning physical therapy. Functional testing indicates improvement in le strength and walking/activity toleration.  She reports pain level continues to reach her normal high 10/10 but she is able to tolerate longer times standing and completing ADL's as well as other functional mobility tolerating pain for  longer periods of time. She also reports a significant improvement in her ability to sleep waking only 2 x nightly rather than every hour due to pain.  She is planning on getting infor to attend [facility]/pool for future access to be able to continue with aquatic exercises program.  She will continue to benefit from skilled PT in aquatic setting to further progress in function, manage pain and reach stated goals.   OBJECTIVE IMPAIRMENTS: Abnormal gait, decreased activity tolerance, decreased balance, decreased endurance, decreased mobility, difficulty walking, decreased ROM, decreased strength, increased muscle spasms, impaired sensation, postural dysfunction, obesity, and pain.   ACTIVITY LIMITATIONS: carrying, lifting, bending, sitting, standing, squatting, sleeping, stairs, transfers, bed mobility, bathing, dressing, hygiene/grooming, locomotion level, and caring for others  PARTICIPATION LIMITATIONS: meal prep, cleaning, laundry, driving, shopping, community activity, occupation, and yard work  PERSONAL FACTORS: Fitness, Past/current experiences, Time since onset of injury/illness/exacerbation, and 3+ comorbidities: See Pmhx are also affecting patient's functional outcome.   REHAB POTENTIAL: Fair due to co-morbidities  CLINICAL DECISION MAKING: Unstable/unpredictable  EVALUATION COMPLEXITY: High   GOALS: Goals reviewed with patient? Yes  SHORT TERM GOALS: Target date: 11/17/23  Pt will tolerate full aquatic sessions consistently without increase in pain and with improving function to demonstrate  good toleration and effectiveness of intervention.  Baseline: Goal status: MET- 01/06/24  2.  Pt will amb x 10 minutes continuously submerged to demonstrate improving endurance Baseline:  Goal status: IN PROGRESS- 02/24/24  3.  Pt will perform STS from pool bench x 10 without LOB Baseline: (with feet on ground)  Goal status:MET -01/06/24   LONG TERM GOALS: Target date:  POC date  Pt to improve on ODI by  up to 10% to demonstrate statistically significant Improvement in function. Baseline: 38/50=76% / 37/50=74% Goal status: In progress 02/17/24  2.  Pt will report decreased frequency of waking at night by 50% Baseline:  Goal status: Met 02/17/24  3.  Pt will tolerate walking to and from setting and engaging in aquatic therapy session without excessive fatigue or increase in pain to demonstrate improved toleration to activity. Baseline:  Goal status: Partially met - 03/25/24  4.  Pt will tolerate stair climbing into and exiting pool x 7 steps ascending and descending 6 steps without use of handrail Baseline: see note; needs heavy use of rail Goal status:  Partially met 03/25/24  5.  Pt will report decrease in pain by at least 50% for improved toleration to activity/quality of life and to demonstrate improved management of pain. Baseline:  Goal status: In progress 02/17/24  6.  Pt will be indep with final HEP's (land and aquatic as appropriate) for continued management of condition Baseline:  Goal status: In progress 02/17/24  PLAN:  PT FREQUENCY: 1-2x/week  PT DURATION:8 weeks  PLANNED INTERVENTIONS: 97164- PT Re-evaluation, 97750- Physical Performance Testing, 97110-Therapeutic exercises, 97530- Therapeutic activity, 97112- Neuromuscular re-education, 97535- Self Care, 02859- Manual therapy, [phone]- Gait training, [phone]- Orthotic Initial, [phone]- Aquatic Therapy, [phone]- Electrical stimulation (unattended), [phone]- Electrical stimulation (manual), [id number]- Ionotophoresis 4mg /ml Dexamethasone , 79439 (1-2 muscles), 20561 (3+ muscles)- Dry Needling, Patient/Family education, Balance training, Stair training, Taping, Joint mobilization, DME instructions, Cryotherapy, and Moist heat.  PLAN FOR NEXT SESSION: aquatics:  general strengthening and stretching; balance and gait training, pain management   Delon Aquas, PTA 03/25/24 11:43 AM [facility]  [facility] [address] [place], [place], [address] Phone: [phone]   Fax:  [phone]

## 2024-03-26 ENCOUNTER — Telehealth: Payer: Self-pay | Admitting: Family Medicine

## 2024-03-26 NOTE — Telephone Encounter (Signed)
 Copied from CRM [id number]. Topic: Referral - Request for Referral >> Mar 26, 2024  2:51 PM Rachelle R wrote: Did the patient discuss referral with their provider in the last year? Yes *current PT facility to far from home, would like to change in the new year.  Appointment offered? No  Type of order/referral and detailed reason for visit: Aquatic Therapy (PT)  Preference of office, provider, location:  [facility] [place]  [address] [place], [place] [address] [phone]  If referral order, have you been seen by this specialty before? Yes [facility]  Can we respond through MyChart? Yes

## 2024-03-29 NOTE — Progress Notes (Unsigned)
 03/30/24  Patient ID: Gabriela Guerrero, female   DOB: Oct 14, 1960, 63 y.o.   MRN: [id number]  Subjective/Objective Telephone visit to follow-up on management of chronic conditions  Diabetes: Current medications: novolog  14 units TID cf, Mounjaro  5mg  weekly, Toujeo  64 units daily -Medications tried in the past: Ozempic   -Patient took 1st dose of Mounjaro  5mg  Sunday, this is a dose increase from the 2.5mg  weekly she had been on previously -She was experiencing some constipation and was using Miralax and a stool softener as needed, but she recently endorses some loose stools and has not been using Miralax or a stool softener -FBG remains elevated with readings 150-190 -Using True Metrix meter; testing 1-2 times daily -Patient continues aquatic therapy 2x/wk -Endorses eating less with being on Mounjaro  -Endorses good water intake -ACEi/ARB for cardiorenal protection:  yes -UACR  265 on 7/24 -Statin for ASCVD risk reduction:  yes  Hypertension: Current medications: amlodipine /valsartan  10/320mg  daily, carvedilol  25mg  BID, furosemide  20mg  daily PRN, clonidine  0.1mg  TID -Patient was able to find some parts of her home BP monitor, but she has not got this working.  She does receive an OTC allowance with her insurance, but this is currently used to help pay utilities -Last OV BP was 126/69 on 10/30   Hyperlipidemia/ASCVD Risk Reduction Current lipid lowering medications: atorvastatin  40mg  daily Antiplatelet regimen: ASA 81mg  daily  Lab Results  Component Value Date   HGBA1C 11.2 (H) 11/13/2023   HGBA1C 8.9 (A) 01/22/2023   HGBA1C 8.0 (A) 09/24/2022      Component Value Date/Time   LDLDIRECT 79 12/11/2022 0858      Component Value Date/Time   NA 131 (L) 11/13/2023 1407   K 5.2 11/13/2023 1407   CL 92 (L) 11/13/2023 1407   CO2 23 11/13/2023 1407   GLUCOSE 432 (H) 11/13/2023 1407   GLUCOSE 266 (H) 03/22/2021 1925   BUN 20 11/13/2023 1407   CREATININE 1.63 (H) 11/13/2023 1407    CALCIUM  9.5 11/13/2023 1407   PROT 7.5 11/13/2023 1407   ALBUMIN 3.7 (L) 11/13/2023 1407   AST 11 11/13/2023 1407   ALT 13 11/13/2023 1407   ALKPHOS 80 11/13/2023 1407   BILITOT 0.4 11/13/2023 1407   EGFR 35 (L) 11/13/2023 1407   GFRNONAA 49 (L) 03/22/2021 1925    Assessment/Plan:    Diabetes: -Currently uncontrolled based on A1c goal <7% -UACR not at goal of <30 -BP is at goal -Sees Dr. Alvia again 1/7 and will be due for A1c, UACR, CMP, and lipid panel -Patient has enough Mounjaro  5mg  to last until next PCP visit; I recommend increasing to 7.5mg  weekly if she has tolerated 4 weeks of 5mg  well -Continue to increase physical activity as able -Decrease carbohydrates and sugar, increase lean protein, fruits, vegeables, and fiber intake.  Advised patient fiber can help bulk stools if she is continuing to experience loose stools -Once A1c <9%,  verify no ketones in urine, no frequent UTI's or genital yeast infections- if not, I recommend addition of SGLT2 to lower A1c, protect kidneys   Hypertension: -Currently controlled based on last OV reading -Continue current regimen -I will either plan to see patient on 1/7 to provide a home BP monitor, or will leave one for Dr. Estil to give her that day at her follow-up visit with him   Hyperlipidemia/ASCVD Risk Reduction: -Currently uncontrolled with LDL >70 -Recommend follow-up lipid panel; if LDL remains >70, increase atorvastatin  to 80mg  daily   Follow Up Plan:  2 months  Gabriela Guerrero, PharmD, DPLA

## 2024-03-30 ENCOUNTER — Other Ambulatory Visit: Payer: Medicare (Managed Care)

## 2024-03-30 DIAGNOSIS — E1122 Type 2 diabetes mellitus with diabetic chronic kidney disease: Secondary | ICD-10-CM

## 2024-03-30 DIAGNOSIS — I129 Hypertensive chronic kidney disease with stage 1 through stage 4 chronic kidney disease, or unspecified chronic kidney disease: Secondary | ICD-10-CM

## 2024-03-30 DIAGNOSIS — M961 Postlaminectomy syndrome, not elsewhere classified: Secondary | ICD-10-CM

## 2024-03-31 ENCOUNTER — Ambulatory Visit (HOSPITAL_BASED_OUTPATIENT_CLINIC_OR_DEPARTMENT_OTHER): Payer: Medicare (Managed Care) | Admitting: Physical Therapy

## 2024-03-31 ENCOUNTER — Other Ambulatory Visit: Payer: Self-pay | Admitting: Family Medicine

## 2024-03-31 DIAGNOSIS — M961 Postlaminectomy syndrome, not elsewhere classified: Secondary | ICD-10-CM

## 2024-03-31 MED ORDER — PREGABALIN 100 MG PO CAPS: 100.0000 mg | ORAL_CAPSULE | Freq: Two times a day (BID) | ORAL | 1 refills | Status: AC

## 2024-03-31 MED ORDER — PREGABALIN 100 MG PO CAPS: 100.0000 mg | ORAL_CAPSULE | Freq: Two times a day (BID) | ORAL | 1 refills | Status: DC

## 2024-04-01 ENCOUNTER — Ambulatory Visit (HOSPITAL_BASED_OUTPATIENT_CLINIC_OR_DEPARTMENT_OTHER): Payer: Medicare (Managed Care) | Admitting: Physical Therapy

## 2024-04-02 ENCOUNTER — Telehealth: Payer: Self-pay

## 2024-04-02 NOTE — Progress Notes (Signed)
° °  04/02/2024  Patient ID: Gabriela Guerrero, female   DOB: 05-24-1960, 63 y.o.   MRN: [id number]  This patient is appearing on a report for being at risk of failing the adherence measure for cholesterol (statin) medications this calendar year.   Medication: atorvastatin  40mg  Last fill date: 12/13/23 for 90 day supply  Contacted pharmacy to facilitate refills.  Channing DELENA Mealing, PharmD, DPLA

## 2024-04-08 ENCOUNTER — Ambulatory Visit (HOSPITAL_BASED_OUTPATIENT_CLINIC_OR_DEPARTMENT_OTHER): Payer: Medicare (Managed Care) | Admitting: Physical Therapy

## 2024-04-12 ENCOUNTER — Other Ambulatory Visit: Payer: Self-pay | Admitting: Family Medicine

## 2024-04-12 DIAGNOSIS — I152 Hypertension secondary to endocrine disorders: Secondary | ICD-10-CM

## 2024-04-13 ENCOUNTER — Ambulatory Visit (HOSPITAL_BASED_OUTPATIENT_CLINIC_OR_DEPARTMENT_OTHER): Payer: Medicare (Managed Care) | Admitting: Physical Therapy

## 2024-04-19 ENCOUNTER — Other Ambulatory Visit: Payer: Self-pay | Admitting: Family Medicine

## 2024-04-19 DIAGNOSIS — K219 Gastro-esophageal reflux disease without esophagitis: Secondary | ICD-10-CM

## 2024-04-19 DIAGNOSIS — E1165 Type 2 diabetes mellitus with hyperglycemia: Secondary | ICD-10-CM

## 2024-04-19 NOTE — Telephone Encounter (Signed)
 Requesting rx rf of  zofran  8mg  ( Last listed as patient not taking )  Last written 08/28/2023 Dr. Curtis Last OV 02/19/2024 Upcoming appt 04/28/2024

## 2024-04-19 NOTE — Telephone Encounter (Unsigned)
 Copied from CRM #[id number]. Topic: Clinical - Medication Refill >> Apr 19, 2024  2:15 PM Dominique A wrote: Medication: ondansetron  (ZOFRAN -ODT) 8 MG disintegrating tablet  Has the patient contacted their pharmacy? Yes Pharmacy sent over a refill request.   This is the patient's preferred pharmacy:  [facility] [address] [place] - [address] [place] [address] Phone: [phone] Fax: [phone]  Is this the correct pharmacy for this prescription? Yes   Has the prescription been filled recently? No. Patient started throwing up over the weekend.   Is the patient out of the medication? Yes  Has the patient been seen for an appointment in the last year OR does the patient have an upcoming appointment? Yes  Can we respond through MyChart? No  Agent: Please be advised that Rx refills may take up to 3 business days. We ask that you follow-up with your pharmacy.

## 2024-04-20 ENCOUNTER — Ambulatory Visit (HOSPITAL_BASED_OUTPATIENT_CLINIC_OR_DEPARTMENT_OTHER): Payer: Medicare (Managed Care) | Admitting: Physical Therapy

## 2024-04-20 MED ORDER — ONDANSETRON 8 MG PO TBDP: 8.0000 mg | ORAL_TABLET | Freq: Three times a day (TID) | ORAL | 3 refills | Status: AC | PRN

## 2024-04-21 ENCOUNTER — Telehealth: Payer: Self-pay

## 2024-04-21 NOTE — Progress Notes (Signed)
" ° °  04/21/2024  Patient ID: Gabriela Guerrero, female   DOB: 02-07-1961, 63 y.o.   MRN: [id number]  Outreach to inform patient home BP monitor has been left at Dr. Estil office for her to pick up at her visit next week on 1/7.  Channing DELENA Mealing, PharmD, DPLA  "

## 2024-04-28 ENCOUNTER — Ambulatory Visit: Payer: Medicare (Managed Care) | Admitting: Family Medicine

## 2024-04-28 ENCOUNTER — Encounter: Payer: Self-pay | Admitting: Family Medicine

## 2024-04-28 VITALS — BP 141/74 | HR 80 | Ht 61.0 in | Wt 237.0 lb

## 2024-04-28 DIAGNOSIS — F3341 Major depressive disorder, recurrent, in partial remission: Secondary | ICD-10-CM

## 2024-04-28 DIAGNOSIS — E1159 Type 2 diabetes mellitus with other circulatory complications: Secondary | ICD-10-CM

## 2024-04-28 DIAGNOSIS — E1165 Type 2 diabetes mellitus with hyperglycemia: Secondary | ICD-10-CM

## 2024-04-28 DIAGNOSIS — I152 Hypertension secondary to endocrine disorders: Secondary | ICD-10-CM

## 2024-04-28 DIAGNOSIS — M961 Postlaminectomy syndrome, not elsewhere classified: Secondary | ICD-10-CM

## 2024-04-28 DIAGNOSIS — E1122 Type 2 diabetes mellitus with diabetic chronic kidney disease: Secondary | ICD-10-CM | POA: Diagnosis not present

## 2024-04-28 DIAGNOSIS — Z794 Long term (current) use of insulin: Secondary | ICD-10-CM | POA: Diagnosis not present

## 2024-04-28 DIAGNOSIS — I129 Hypertensive chronic kidney disease with stage 1 through stage 4 chronic kidney disease, or unspecified chronic kidney disease: Secondary | ICD-10-CM

## 2024-04-28 LAB — POCT GLYCOSYLATED HEMOGLOBIN (HGB A1C): HbA1c, POC (controlled diabetic range): 9.1 % — AB (ref 0.0–7.0)

## 2024-04-28 MED ORDER — TIRZEPATIDE 7.5 MG/0.5ML ~~LOC~~ SOAJ: 7.5000 mg | SUBCUTANEOUS | 0 refills | Status: AC

## 2024-04-28 NOTE — Assessment & Plan Note (Signed)
 Diabetes has been poorly controlled.  Continue Toujeo  68 units and Humalog  at 14 units.   She will continue tolerating Mounjaro  pretty well at 5 mg and will increase further to 7.5 mg.  Continue bowel regimen for constipation.  Associated CKD is stable at this time.  . Lab Results  Component Value Date   NA 131 (L) 11/13/2023   K 5.2 11/13/2023   CREATININE 1.63 (H) 11/13/2023   EGFR 35 (L) 11/13/2023   GLUCOSE 432 (H) 11/13/2023

## 2024-04-28 NOTE — Assessment & Plan Note (Signed)
 Continue on duloxetine  at current strength.

## 2024-04-28 NOTE — Progress Notes (Signed)
 " Gabriela Guerrero - 64 y.o. female MRN [id number]  Date of birth: 07-May-1960  Subjective Chief Complaint  Patient presents with   Diabetes   Hypertension    HPI Gabriela Guerrero is a 64 y.o. female here today for follow up visit.   She reports that she is doing pretty well..   She has been seeing endocrinology for management of diabetes but has not been seen since 05/2023.  She has been doing aquatic therapy for exercise.  She remains on Toujeo  and mounjaro .  Doing well with combination of medications. A1c today is down to 9.1%.  Weight is down a little over 20 pounds.   Tolerating atorvastatin  for associated HLD.   HTN is treated with Exforge  and clonidine .  Blood pressure is mildly elevated today.  She denies chest pain, shortness of breath, palpitations, headaches or vision changes.  She has continued chronic low back pain.  PT/Aquatic therapy has helped some.  Remains on duloxetine , lyrica , nortriptyline,  and robaxin .  Has tramadol  if needed which is helpful.    ROS:  A comprehensive ROS was completed and negative except as noted per HPI    Allergies[1]  Past Medical History:  Diagnosis Date   Hypertension     Past Surgical History:  Procedure Laterality Date   LUMBAR FUSION      Social History   Socioeconomic History   Marital status: Single    Spouse name: Not on file   Number of children: Not on file   Years of education: Not on file   Highest education level: Not on file  Occupational History   Occupation: Disabled  Tobacco Use   Smoking status: Never   Smokeless tobacco: Never  Vaping Use   Vaping status: Never Used  Substance and Sexual Activity   Alcohol use: Never   Drug use: Never   Sexual activity: Not Currently    Partners: Male    Birth control/protection: Abstinence  Other Topics Concern   Not on file  Social History Narrative   Not on file   Social Drivers of Health   Tobacco Use: Low Risk (04/28/2024)   Patient History    Smoking Tobacco Use:  Never    Smokeless Tobacco Use: Never    Passive Exposure: Not on file  Financial Resource Strain: High Risk (02/19/2024)   Overall Financial Resource Strain (CARDIA)    Difficulty of Paying Living Expenses: Hard  Food Insecurity: Low Risk (04/13/2024)   Received from [facility]   Epic    Within the past 12 months, you worried that your food would run out before you got money to buy more: Never true    Within the past 12 months, the food you bought just didn't last and you didn't have money to get more. : Never true  Transportation Needs: No Transportation Needs (04/13/2024)   Received from [facility]    In the past 12 months, has lack of reliable transportation kept you from medical appointments, meetings, work or from getting things needed for daily living? : No  Physical Activity: Inactive (02/19/2024)   Exercise Vital Sign    Days of Exercise per Week: 0 days    Minutes of Exercise per Session: 0 min  Stress: Stress Concern Present (02/19/2024)   [employer] of Occupational [employer] - Occupational Stress Questionnaire    Feeling of Stress: Rather much  Social Connections: Moderately Isolated (02/19/2024)   Social Connection and Isolation Panel    Frequency of Communication  with Friends and Family: Once a week    Frequency of Social Gatherings with Friends and Family: Once a week    Attends Religious Services: 1 to 4 times per year    Active Member of Clubs or Organizations: Yes    Attends Banker Meetings: Never    Marital Status: Never married  Depression (PHQ2-9): Low Risk (04/28/2024)   Depression (PHQ2-9)    PHQ-2 Score: 2  Alcohol Screen: Low Risk (02/19/2024)   Alcohol Screen    Last Alcohol Screening Score (AUDIT): 0  Housing: Low Risk (04/13/2024)   Received from [facility]   Epic    What is your living situation today?: I have a steady place to live    Think about the place you live. Do you have problems with any of the  following? Choose all that apply:: None/None on this list  Utilities: Low Risk (04/13/2024)   Received from [facility]   Utilities    In the past 12 months has the electric, gas, oil, or water company threatened to shut off services in your home? : No  Health Literacy: Adequate Health Literacy (02/19/2024)   B1300 Health Literacy    Frequency of need for help with medical instructions: Rarely    Family History  Problem Relation Age of Onset   Hypertension Mother    Breast cancer Mother    Hypertension Sister        died of aneurysm from HTN   Stroke Sister    Migraines Neg Hx     Health Maintenance  Topic Date Due   HIV Screening  Never done   Hepatitis C Screening  Never done   Cervical Cancer Screening (HPV/Pap Cotest)  Never done   Medicare Annual Wellness (AWV)  05/29/2024 (Originally 18-Apr-1961)   Influenza Vaccine  07/20/2024 (Originally 11/21/2023)   Zoster Vaccines- Shingrix (1 of 2) 07/27/2024 (Originally 03/27/2011)   Pneumococcal Vaccine: 50+ Years (2 of 2 - PCV) 01/13/2025 (Originally 04/23/2019)   COVID-19 Vaccine (3 - 2025-26 season) 01/29/2025 (Originally 12/22/2023)   OPHTHALMOLOGY EXAM  08/03/2024   HEMOGLOBIN A1C  10/26/2024   Diabetic kidney evaluation - eGFR measurement  11/12/2024   Diabetic kidney evaluation - Urine ACR  11/12/2024   FOOT EXAM  02/18/2025   Mammogram  03/04/2025   DTaP/Tdap/Td (2 - Td or Tdap) 06/26/2025   Colonoscopy  01/31/2029   Hepatitis B Vaccines 19-59 Average Risk  Aged Out   HPV VACCINES  Aged Out   Meningococcal B Vaccine  Aged Out     ----------------------------------------------------------------------------------------------------------------------------------------------------------------------------------------------------------------- Physical Exam BP (!) 141/74   Pulse 80   Ht 5' 1 (1.549 m)   Wt 237 lb (107.5 kg)   SpO2 98%   BMI 44.78 kg/m   Physical Exam Constitutional:      Appearance: Normal appearance.   HENT:     Head: Normocephalic and atraumatic.  Eyes:     General: No scleral icterus. Cardiovascular:     Rate and Rhythm: Normal rate and regular rhythm.  Pulmonary:     Effort: Pulmonary effort is normal.     Breath sounds: Normal breath sounds.  Musculoskeletal:     Cervical back: Neck supple.     Comments: Ambulates with rollator.  Neurological:     Mental Status: She is alert.  Psychiatric:        Mood and Affect: Mood normal.        Behavior: Behavior normal.     ------------------------------------------------------------------------------------------------------------------------------------------------------------------------------------------------------------------- Assessment and Plan  Hypertension associated with stage 3a chronic kidney disease due to type 2 diabetes mellitus (HCC) Diabetes has been poorly controlled.  Continue Toujeo  68 units and Humalog  at 14 units.   She will continue tolerating Mounjaro  pretty well at 5 mg and will increase further to 7.5 mg.  Continue bowel regimen for constipation.  Associated CKD is stable at this time.  . Lab Results  Component Value Date   NA 131 (L) 11/13/2023   K 5.2 11/13/2023   CREATININE 1.63 (H) 11/13/2023   EGFR 35 (L) 11/13/2023   GLUCOSE 432 (H) 11/13/2023     Hypertension associated with diabetes (HCC) BP is well controlled.   Encouraged to use medications regularly.  Low-sodium diet encouraged.  MDD (major depressive disorder), recurrent, in partial remission Continue on duloxetine  at current strength.  Failed back syndrome of lumbar spine She continues to have significant disability related to her failed back pain syndrome as well as right-sided sciatica and neuropathy.  Doing better with Lyrica .  We can titrate this as needed and as tolerated.  Has tramadol  as needed as well.  Continue with aquatic therapy as this is quite helpful.   Meds ordered this encounter  Medications   tirzepatide  (MOUNJARO )  7.5 MG/0.5ML Pen    Sig: Inject 7.5 mg into the skin once a week.    Dispense:  6 mL    Refill:  0    Return in about 6 months (around 10/26/2024) for Type 2 Diabetes, Hypertension.         [1] No Known Allergies  "

## 2024-04-28 NOTE — Assessment & Plan Note (Signed)
 BP is well controlled.   Encouraged to use medications regularly.  Low-sodium diet encouraged.

## 2024-04-28 NOTE — Patient Instructions (Signed)
 Increase mounjaro  to 7.5mg /week.  Schedule appt with endocrinology.

## 2024-04-28 NOTE — Assessment & Plan Note (Signed)
 She continues to have significant disability related to her failed back pain syndrome as well as right-sided sciatica and neuropathy.  Doing better with Lyrica .  We can titrate this as needed and as tolerated.  Has tramadol  as needed as well.  Continue with aquatic therapy as this is quite helpful.

## 2024-05-07 ENCOUNTER — Other Ambulatory Visit: Payer: Self-pay | Admitting: Family Medicine

## 2024-05-07 DIAGNOSIS — E782 Mixed hyperlipidemia: Secondary | ICD-10-CM

## 2024-05-11 ENCOUNTER — Ambulatory Visit: Payer: Self-pay

## 2024-05-11 NOTE — Telephone Encounter (Signed)
 FYI Only or Action Required?: FYI only for provider: ED advised.  Patient was last seen in primary care on 04/28/2024 by Alvia Bring, DO.  Called Nurse Triage reporting Migraine.  Symptoms began today.  Interventions attempted: Rest, hydration, or home remedies.  Symptoms are: unchanged.  Triage Disposition: Go to ED Now (Notify PCP)  Patient/caregiver understands and will follow disposition?: No, refuses disposition   Reason for Disposition  [1] SEVERE headache (e.g., excruciating) AND [2] worst headache of life  Answer Assessment - Initial Assessment Questions Patient states that she has a history of migraines, but she hasn't had one in a long time. She started to experience this headache about 2 AM this morning. She reports it as 12/10 pain and states that it's worse than what she remembers of her previous migraines. She answers yes to this being the worst headache of her life but she has not taken any medication because she doesn't have any available. She reports recently losing some family members and feeling anxious and isn't sure if this is related. ED advised, patient refuses. States she does not want to go right now, but was hoping to get a refill on her Fioricet. Advised to call back or call 911 if symptoms worsen-states that she has a string in her room that she can use to call them easily.   1. LOCATION: Where does it hurt?      Behind eyes, back of head  2. ONSET: When did the headache start? (e.g., minutes, hours, days)      2AM today  3. PATTERN: Does the pain come and go, or has it been constant since it started?     Constant  4. SEVERITY: How bad is the pain? and What does it keep you from doing?  (e.g., Scale 1-10; mild, moderate, or severe)     12/10  5. RECURRENT SYMPTOM: Have you ever had headaches before? If Yes, ask: When was the last time? and What happened that time?      Yes, has history of migraines  6. CAUSE: What do you think  is causing the headache?     Migraines  7. MIGRAINE: Have you been diagnosed with migraine headaches? If Yes, ask: Is this headache similar?      Yes, states this headache is worse  8. HEAD INJURY: Has there been any recent injury to your head?      No  9. OTHER SYMPTOMS: Do you have any other symptoms? (e.g., fever, stiff neck, eye pain, sore throat, cold symptoms)     Nausea, neck stiffness  10. PREGNANCY: Is there any chance you are pregnant? When was your last menstrual period?       NA  Protocols used: [facility]  Reason for Triage: Patient stated she has had a migraine since 2 AM today 05/11/24. She stated that the pain is 12 out of 10 and patient was having a hard time reading the name of the medication she is requesting a refill for due to the migraine. Patient is requesting refill for butalbital -acetaminophen-caffeine  (FIORICET) 50-325-40 MG tablet

## 2024-05-12 NOTE — Telephone Encounter (Signed)
 Spoke with patient - informed of Dr. Alvia strong recommendations to go to ER .  She still declined.  Urged again and patient still declines.  She states she understands the risks of not going but is choosing not to go

## 2024-05-25 ENCOUNTER — Other Ambulatory Visit: Payer: Medicare (Managed Care)

## 2024-05-26 ENCOUNTER — Other Ambulatory Visit: Payer: Medicare (Managed Care)

## 2024-05-26 DIAGNOSIS — I129 Hypertensive chronic kidney disease with stage 1 through stage 4 chronic kidney disease, or unspecified chronic kidney disease: Secondary | ICD-10-CM

## 2024-05-26 DIAGNOSIS — G43819 Other migraine, intractable, without status migrainosus: Secondary | ICD-10-CM

## 2024-05-26 DIAGNOSIS — E1122 Type 2 diabetes mellitus with diabetic chronic kidney disease: Secondary | ICD-10-CM

## 2024-05-26 DIAGNOSIS — Z794 Long term (current) use of insulin: Secondary | ICD-10-CM

## 2024-05-26 DIAGNOSIS — N1831 Chronic kidney disease, stage 3a: Secondary | ICD-10-CM

## 2024-05-28 MED ORDER — BUTALBITAL-APAP-CAFFEINE 50-325-40 MG PO TABS: 1.0000 | ORAL_TABLET | Freq: Four times a day (QID) | ORAL | 0 refills | Status: AC | PRN

## 2024-05-28 MED ORDER — SUMATRIPTAN SUCCINATE 50 MG PO TABS: 50.0000 mg | ORAL_TABLET | ORAL | 1 refills | Status: AC | PRN

## 2024-05-28 NOTE — Addendum Note (Signed)
 Addended by: Hanh Kertesz E on: 05/28/2024 12:31 PM   Modules accepted: Orders

## 2024-06-23 ENCOUNTER — Other Ambulatory Visit

## 2024-08-18 ENCOUNTER — Ambulatory Visit: Payer: Medicare (Managed Care) | Admitting: Family Medicine

## 2024-10-27 ENCOUNTER — Ambulatory Visit: Admitting: Family Medicine
